# Patient Record
Sex: Female | Born: 1970 | Race: White | Hispanic: No | Marital: Married | Smoking: Never smoker
Health system: Southern US, Community
[De-identification: ages and names within clinical notes are randomized; demographics above are authoritative.]

## PROBLEM LIST (undated history)

## (undated) DIAGNOSIS — Z1379 Encounter for other screening for genetic and chromosomal anomalies: Secondary | ICD-10-CM

## (undated) DIAGNOSIS — D509 Iron deficiency anemia, unspecified: Secondary | ICD-10-CM

## (undated) DIAGNOSIS — E039 Hypothyroidism, unspecified: Secondary | ICD-10-CM

## (undated) DIAGNOSIS — D649 Anemia, unspecified: Secondary | ICD-10-CM

## (undated) DIAGNOSIS — D3911 Neoplasm of uncertain behavior of right ovary: Secondary | ICD-10-CM

## (undated) DIAGNOSIS — C187 Malignant neoplasm of sigmoid colon: Secondary | ICD-10-CM

## (undated) DIAGNOSIS — R519 Headache, unspecified: Secondary | ICD-10-CM

## (undated) DIAGNOSIS — I10 Essential (primary) hypertension: Secondary | ICD-10-CM

## (undated) DIAGNOSIS — R011 Cardiac murmur, unspecified: Secondary | ICD-10-CM

## (undated) DIAGNOSIS — D126 Benign neoplasm of colon, unspecified: Secondary | ICD-10-CM

## (undated) DIAGNOSIS — R51 Headache: Secondary | ICD-10-CM

## (undated) DIAGNOSIS — K5790 Diverticulosis of intestine, part unspecified, without perforation or abscess without bleeding: Secondary | ICD-10-CM

## (undated) DIAGNOSIS — E785 Hyperlipidemia, unspecified: Secondary | ICD-10-CM

## (undated) DIAGNOSIS — E079 Disorder of thyroid, unspecified: Secondary | ICD-10-CM

## (undated) DIAGNOSIS — M67441 Ganglion, right hand: Secondary | ICD-10-CM

## (undated) HISTORY — DX: Encounter for other screening for genetic and chromosomal anomalies: Z13.79

## (undated) HISTORY — PX: APPENDECTOMY: SHX54

## (undated) HISTORY — DX: Hyperlipidemia, unspecified: E78.5

## (undated) HISTORY — PX: MOUTH SURGERY: SHX715

## (undated) HISTORY — DX: Disorder of thyroid, unspecified: E07.9

## (undated) SURGERY — COLECTOMY, SIGMOID, LAPAROSCOPIC
Anesthesia: General

---

## 2002-04-12 ENCOUNTER — Other Ambulatory Visit: Admission: RE | Admit: 2002-04-12 | Discharge: 2002-04-12 | Payer: Self-pay | Admitting: Obstetrics and Gynecology

## 2002-10-30 ENCOUNTER — Inpatient Hospital Stay (HOSPITAL_COMMUNITY): Admission: AD | Admit: 2002-10-30 | Discharge: 2002-11-01 | Payer: Self-pay | Admitting: Obstetrics and Gynecology

## 2003-04-22 ENCOUNTER — Other Ambulatory Visit: Admission: RE | Admit: 2003-04-22 | Discharge: 2003-04-22 | Payer: Self-pay | Admitting: Obstetrics and Gynecology

## 2014-01-28 ENCOUNTER — Ambulatory Visit: Payer: Self-pay | Admitting: Family Medicine

## 2014-05-06 ENCOUNTER — Ambulatory Visit: Payer: Self-pay | Admitting: Family Medicine

## 2014-05-06 LAB — TSH: THYROID STIMULATING HORM: 4.59 u[IU]/mL — AB

## 2014-05-16 ENCOUNTER — Ambulatory Visit: Payer: Self-pay | Admitting: Family Medicine

## 2014-05-20 HISTORY — PX: TOTAL ABDOMINAL HYSTERECTOMY: SHX209

## 2014-06-04 ENCOUNTER — Ambulatory Visit: Payer: Self-pay | Admitting: Gynecologic Oncology

## 2014-06-05 ENCOUNTER — Ambulatory Visit: Payer: Self-pay | Admitting: Gynecologic Oncology

## 2014-06-05 LAB — BASIC METABOLIC PANEL
Anion Gap: 5 — ABNORMAL LOW (ref 7–16)
BUN: 6 mg/dL — ABNORMAL LOW (ref 7–18)
CHLORIDE: 104 mmol/L (ref 98–107)
CREATININE: 0.65 mg/dL (ref 0.60–1.30)
Calcium, Total: 8.4 mg/dL — ABNORMAL LOW (ref 8.5–10.1)
Co2: 28 mmol/L (ref 21–32)
EGFR (African American): >60
EGFR (Non-African Amer.): >60
GLUCOSE: 97 mg/dL (ref 65–99)
Osmolality: 271 (ref 275–301)
POTASSIUM: 4.1 mmol/L (ref 3.5–5.1)
SODIUM: 137 mmol/L (ref 136–145)

## 2014-06-05 LAB — CBC
HCT: 40.2 % (ref 35.0–47.0)
HGB: 13.4 g/dL (ref 12.0–16.0)
MCH: 29.6 pg (ref 26.0–34.0)
MCHC: 33.4 g/dL (ref 32.0–36.0)
MCV: 88 fL (ref 80–100)
PLATELETS: 217 10*3/uL (ref 150–440)
RBC: 4.55 10*6/uL (ref 3.80–5.20)
RDW: 14.6 % — AB (ref 11.5–14.5)
WBC: 5.8 10*3/uL (ref 3.6–11.0)

## 2014-06-11 ENCOUNTER — Ambulatory Visit: Payer: Self-pay | Admitting: Gynecologic Oncology

## 2014-06-12 LAB — HEMOGLOBIN: HGB: 12.3 g/dL (ref 12.0–16.0)

## 2014-06-19 ENCOUNTER — Ambulatory Visit: Payer: Self-pay | Admitting: Gynecologic Oncology

## 2014-07-05 LAB — PATHOLOGY REPORT

## 2014-11-06 ENCOUNTER — Ambulatory Visit: Payer: Self-pay | Admitting: Obstetrics and Gynecology

## 2015-04-12 NOTE — Op Note (Signed)
PATIENT NAME:  Regina Patel, Regina Patel MR#:  [id number] DATE OF BIRTH:  July 05, 1971  DATE OF PROCEDURE:  06/11/2014  PREOPERATIVE DIAGNOSIS: Pelvic mass.   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   PROCEDURES PERFORMED: 1.  Exploratory laparotomy. 2.  Right salpingo-oophorectomy. 3.  Total abdominal hysterectomy. 4.  Left salpingectomy. 5.  Appendectomy.   SURGEON: Jacquelyne Balint, M.D.   ANESTHESIA: General.   COMPLICATIONS: None.   ESTIMATED BLOOD LOSS: 100 mL.   INDICATION FOR SURGERY: Regina Patel is a 44 year old patient who presented with a large pelvic abdominal mass. There was no evidence of metastatic disease but decision was made to proceed with surgery.   FINDINGS AT TIME OF SURGERY: A large mass arising from the right ovary filling the pelvis and extending into the upper abdomen. Moderate adhesions to this mass from the upper abdomen. No excrescences or papillation. Normal uterus. Small cyst on the left ovary. Otherwise normal inspection.   OPERATIVE REPORT: After adequate general anesthesia had been obtained, the patient was prepped and draped in supine position. A midline incision was placed with a sharp knife and carried down through the fascia. The peritoneum was identified and entered. The incision was extended cephalad and caudad. Exploration was done with the above-mentioned findings. Cytology was taken. Then a pursestring suture was placed into the tumor and a 5 mm trocar inserted. Thus several liters of fluid was removed thus decreasing the size of the mass so it could be exteriorized. Then the pelvic sidewall was entered on the right side. Vessels and ureter were identified. The infundibulopelvic ligament was clamped, cut and ligated twice with 0 Vicryl. The tumor was then mobilized and after a clamp was placed around tube and utero-ovarian ligament the tumor could be completely removed.   Then a Bookwalter retractor was placed. The bowel  was packed away with lap sponges. Attention was directed towards the hysterectomy. The round ligament on the right side was stitch ligated and transected. The anterior fold of the peritoneum was incised and the bladder was freed from lower uterine segment, cervix and vagina. Then the round ligament on the left side was stitch ligated and transected. The pelvic sidewall was entered. Vessels and ureter were identified. The tube was freed from the left ovary and a clamp was placed around the utero-ovarian ligament which was then cut and simply ligated as well as stitch ligated using 0 Vicryl. Then the uterine vessels were clamped, cut and stitch ligated. The uterus was then freed from the connections to cardinal and uterosacral ligaments by serially placing clamps and cutting pedicles which were stitch ligated using 0 Vicryl. The last 2 clamps contained the lateral angles of the vagina. Thus uterus and left fallopian tube could be removed completely. The vagina was closed with figure-of-eight stitches using 0 Vicryl. Irrigation of the pelvis was done and adequate hemostasis was confirmed. A small cyst on the left ovary was removed and sent to pathology for definite evaluation.   Because the tumor was reported to be mucinous, decision was made to proceed with appendectomy. The mesentery of the ileum was transected. The base of the appendix was ligated twice with 0 Vicryl. Then the appendix was removed and the stump was buried in a pursestring suture using 2-0 Vicryl. Hemostasis was noted to be adequate in that area. An omental biopsy was taken by dissecting pedicles which were clamped, cut and ligated.   Irrigation was again performed and hemostasis noted in all areas. Lap, sponges  and retractors were removed. The fascia was closed with a running #1 loop PDS suture. Irrigation of the subcutaneous tissue was performed and adequate hemostasis confirmed before it was reapproximated with 2-0 Vicryl. Then 3-0 Monocryl  was used to close the skin in a subcuticular fashion. Dermabond was applied.   The patient tolerated the procedure well and was taken to the recovery room in satisfactory condition. Postoperative urine was clear. Pad, sponge, needle and instrument counts were correct x2.     ____________________________ Weber Cooks, MD bem:sb D: 07/02/2014 13:59:38 ET T: 07/02/2014 16:43:11 ET JOB#: [id number]  cc: Weber Cooks, MD, <Dictator> Weber Cooks MD ELECTRONICALLY SIGNED 07/02/2014 18:07

## 2015-04-17 ENCOUNTER — Other Ambulatory Visit: Payer: Self-pay

## 2015-04-17 DIAGNOSIS — Z1231 Encounter for screening mammogram for malignant neoplasm of breast: Secondary | ICD-10-CM

## 2015-05-03 DIAGNOSIS — E559 Vitamin D deficiency, unspecified: Secondary | ICD-10-CM | POA: Insufficient documentation

## 2015-05-03 DIAGNOSIS — R140 Abdominal distension (gaseous): Secondary | ICD-10-CM | POA: Insufficient documentation

## 2015-05-03 DIAGNOSIS — E782 Mixed hyperlipidemia: Secondary | ICD-10-CM | POA: Insufficient documentation

## 2015-05-03 DIAGNOSIS — D229 Melanocytic nevi, unspecified: Secondary | ICD-10-CM | POA: Insufficient documentation

## 2015-05-07 ENCOUNTER — Other Ambulatory Visit: Payer: Self-pay | Admitting: Family Medicine

## 2015-05-07 ENCOUNTER — Ambulatory Visit
Admission: RE | Admit: 2015-05-07 | Discharge: 2015-05-07 | Disposition: A | Discharge status: Home / Self Care | Payer: Managed Care, Other (non HMO) | Source: Ambulatory Visit | Attending: Family Medicine | Admitting: Family Medicine

## 2015-05-07 DIAGNOSIS — Z1231 Encounter for screening mammogram for malignant neoplasm of breast: Secondary | ICD-10-CM | POA: Diagnosis not present

## 2015-06-03 ENCOUNTER — Other Ambulatory Visit: Payer: Self-pay

## 2015-06-03 DIAGNOSIS — N83202 Unspecified ovarian cyst, left side: Secondary | ICD-10-CM

## 2015-06-04 ENCOUNTER — Ambulatory Visit: Payer: Commercial Indemnity

## 2015-06-04 DIAGNOSIS — N83202 Unspecified ovarian cyst, left side: Secondary | ICD-10-CM

## 2015-06-04 DIAGNOSIS — N8320 Unspecified ovarian cysts: Secondary | ICD-10-CM | POA: Diagnosis not present

## 2015-07-16 ENCOUNTER — Ambulatory Visit: Payer: Self-pay | Admitting: Family Medicine

## 2015-10-02 ENCOUNTER — Other Ambulatory Visit: Payer: Commercial Indemnity

## 2015-10-02 ENCOUNTER — Ambulatory Visit (INDEPENDENT_AMBULATORY_CARE_PROVIDER_SITE_OTHER): Payer: Commercial Indemnity | Admitting: Obstetrics and Gynecology

## 2015-10-02 ENCOUNTER — Encounter: Payer: Self-pay | Admitting: Obstetrics and Gynecology

## 2015-10-02 VITALS — BP 131/82 | HR 67 | Ht 66.0 in | Wt 200.3 lb

## 2015-10-02 DIAGNOSIS — E039 Hypothyroidism, unspecified: Secondary | ICD-10-CM | POA: Insufficient documentation

## 2015-10-02 DIAGNOSIS — D3911 Neoplasm of uncertain behavior of right ovary: Secondary | ICD-10-CM | POA: Diagnosis not present

## 2015-10-02 DIAGNOSIS — Z90710 Acquired absence of both cervix and uterus: Secondary | ICD-10-CM | POA: Insufficient documentation

## 2015-10-02 DIAGNOSIS — Z90721 Acquired absence of ovaries, unilateral: Secondary | ICD-10-CM

## 2015-10-02 DIAGNOSIS — Z9079 Acquired absence of other genital organ(s): Secondary | ICD-10-CM | POA: Diagnosis not present

## 2015-10-02 NOTE — Progress Notes (Signed)
Chief complaint: 1.  History of borderline tumor of uncertain malignant potential, right ovary. 2.  Status post TAH RSO.  Patient is here for 6 month follow-up.  She is a 44 year old white female, para 28, 56, who is status post TAH RSO and left salpingectomy on 05/2014,  Who presents for follow-up. Bowel and bladder function are normal.  She is not experiencing any significant pelvic pain. Pelvic ultrasound in April 2016 demonstrated a 3 cm complex cyst in the left ovary.  Subsequent follow-up ultrasound in June 2016 demonstrated a normal-appearing left ovary with a simple dominant follicle 1.5 cm.  Past Medical History  Diagnosis Date  . Hyperlipidemia   . Thyroid disease    Past Surgical History  Procedure Laterality Date  . Total abdominal hysterectomy  05/20/2014    ooperectomy unilateral   Review of Systems  Constitutional: Negative.   Respiratory: Negative.   Cardiovascular: Negative.   Gastrointestinal: Negative.   Genitourinary: Negative.   Musculoskeletal: Negative.   Skin: Negative.   Neurological: Negative.   Endo/Heme/Allergies: Negative.   Psychiatric/Behavioral: Negative.    OBJECTIVE: BP 131/82 mmHg  Pulse 67  Ht 5\' 6"  (1.676 m)  Wt 200 lb 5 oz (90.861 kg)  BMI 32.35 kg/m2 Flovent, white female in no acute distress. Back: No CVA tenderness. Abdomen: Well-healed midline incision; soft, nontender; no organomegaly. Pelvic exam: External genitalia normal BUS-normal Vagina-normal. Cervix-surgically absent Uterus-surgically absent. Adnexa-nonpalpable, nontender. Rectovaginal exam-normal sphincter tone,; no rectal masses. Extremities: Without clubbing, cyanosis or edema.  IMPRESSION: 1.  Normal 6 month follow-up. 2.  History of borderline tumor, right ovary of uncertain malignant potential. 3.  Status post TAH, RSO and left salpingectomy.  PLAN: 1.  Pelvic ultrasound. 2.  CA-125. 3.  Return in 6 months for follow-up  Brayton Mars, MD

## 2015-10-02 NOTE — Patient Instructions (Signed)
1.  Normal exam today. 2.  CA-125 lab test today. 3.  Pelvic ultrasound is scheduled. 4.  Return in 6 months for follow-up

## 2015-10-03 ENCOUNTER — Ambulatory Visit: Payer: Commercial Indemnity

## 2015-10-03 DIAGNOSIS — D3911 Neoplasm of uncertain behavior of right ovary: Secondary | ICD-10-CM | POA: Diagnosis not present

## 2015-10-03 LAB — CA 125: CA 125: 6.9 U/mL (ref 0.0–38.1)

## 2015-10-10 ENCOUNTER — Telehealth: Payer: Self-pay

## 2015-10-10 NOTE — Telephone Encounter (Signed)
Pt aware ca 125 wnl.

## 2015-10-16 ENCOUNTER — Telehealth: Payer: Self-pay

## 2015-10-16 NOTE — Telephone Encounter (Signed)
-----   Message from Brayton Mars, MD sent at 10/12/2015  8:48 PM EDT ----- Please Notify - Labs normal Simple cyst 1.3 cm in left ovary (benign)

## 2015-10-16 NOTE — Telephone Encounter (Signed)
I have left 3 messages for pt to contact office. Chart filed.

## 2016-04-01 ENCOUNTER — Ambulatory Visit: Payer: Commercial Indemnity | Admitting: Obstetrics and Gynecology

## 2016-04-08 ENCOUNTER — Encounter: Payer: Self-pay | Admitting: Obstetrics and Gynecology

## 2016-04-08 ENCOUNTER — Ambulatory Visit (INDEPENDENT_AMBULATORY_CARE_PROVIDER_SITE_OTHER): Payer: Commercial Indemnity | Admitting: Obstetrics and Gynecology

## 2016-04-08 VITALS — BP 145/86 | HR 76 | Wt 207.3 lb

## 2016-04-08 DIAGNOSIS — Z90710 Acquired absence of both cervix and uterus: Secondary | ICD-10-CM

## 2016-04-08 DIAGNOSIS — Z9079 Acquired absence of other genital organ(s): Secondary | ICD-10-CM | POA: Diagnosis not present

## 2016-04-08 DIAGNOSIS — Z90721 Acquired absence of ovaries, unilateral: Secondary | ICD-10-CM

## 2016-04-08 DIAGNOSIS — D3911 Neoplasm of uncertain behavior of right ovary: Secondary | ICD-10-CM

## 2016-04-08 DIAGNOSIS — R350 Frequency of micturition: Secondary | ICD-10-CM

## 2016-04-08 LAB — POCT URINALYSIS DIPSTICK
Bilirubin, UA: 1
Glucose, UA: NEGATIVE
KETONES UA: NEGATIVE
Nitrite, UA: NEGATIVE
PROTEIN UA: NEGATIVE
Urobilinogen, UA: 0.2
pH, UA: 6

## 2016-04-08 NOTE — Progress Notes (Signed)
Chief complaint: 1. History of borderline tumor of uncertain malignant potential, right ovary 2. Status post TAH RSO   Patient is here for 6 month follow-up. She is a 45 year old white female, para 12, 79, who is status post TAH RSO and left salpingectomy on 05/2014, Who presents for follow-up. Bowel function is normal. Bladder function is notable for some urinary frequency and post void discomfort intermittently. She is not experiencing any significant pelvic pain. She has gained 7 pounds in past 6 months. Pelvic ultrasound in April 2016 demonstrated a 3 cm complex cyst in the left ovary. Pelvic ultrasound in June 2016 demonstrated a normal-appearing left ovary with a simple dominant follicle 1.5 cm. Pelvic ultrasound in October 2016 demonstrated a normal-appearing left ovary with a simple dominant follicle measuring 1.3 cm  CA-125 on 10/02/2015 was 6.9  Past medical history, past surgical history, problem list, medications, and allergies are reviewed  OBJECTIVE: BP 145/86 mmHg  Pulse 76  Wt 207 lb 5 oz (94.036 kg)  Pleasant well-appearing white female in no acute distress Back: Without CVA tenderness Abdomen: Soft, nontender, without organomegaly; well-healed midline incision without evidence of hernia Pelvic exam: External genitalia normal BUS normal Vagina-normal; good vault support Cervix-surgically absent Uterus-surgically absent Adnexa-right side nonpalpable and nontender; left sided palpable and nontender Rectovaginal exam-normal external exam; normal sphincter tone; no rectal masses Extremities: Without clubbing cyanosis or edema Skin: Without rash  ASSESSMENT: 1. History of borderline tumor, right ovary of uncertain malignant potential 2. Status post TAH RSO and left salpingectomy June 2015 3. No evidence of disease  PLAN: 1. CA-125 2. Pelvic ultrasound 3. Return in 6 months for follow-up. If the CA-125 and pelvic ultrasound were normal, we will proceed with  physical exams only unless other factors dictate imaging and blood work.

## 2016-04-08 NOTE — Patient Instructions (Signed)
1. Ultrasound is scheduled for assessment of the adnexa 2. CA-125 is drawn today 3. Return in 6 months for follow-up 4. Urinalysis and culture is done today to rule out UTI

## 2016-04-09 LAB — CA 125: CA 125: 7.3 U/mL (ref 0.0–38.1)

## 2016-04-10 LAB — URINE CULTURE

## 2016-04-13 ENCOUNTER — Ambulatory Visit (INDEPENDENT_AMBULATORY_CARE_PROVIDER_SITE_OTHER): Payer: Commercial Indemnity

## 2016-04-13 DIAGNOSIS — D3911 Neoplasm of uncertain behavior of right ovary: Secondary | ICD-10-CM | POA: Diagnosis not present

## 2016-04-15 ENCOUNTER — Telehealth: Payer: Self-pay

## 2016-04-15 MED ORDER — NITROFURANTOIN MONOHYD MACRO 100 MG PO CAPS: 100.0000 mg | ORAL_CAPSULE | Freq: Two times a day (BID) | ORAL | Status: DC

## 2016-04-15 NOTE — Telephone Encounter (Signed)
-----   Message from Brayton Mars, MD sent at 04/14/2016  9:37 PM EDT ----- Please notify - Abnormal Labs Call in [place] twice a day for 7 days

## 2016-04-15 NOTE — Telephone Encounter (Signed)
-----   Message from Brayton Mars, MD sent at 04/14/2016  9:37 PM EDT ----- Please notify - Abnormal Labs Call in [place] twice a day for 7 days

## 2016-04-19 MED ORDER — NITROFURANTOIN MONOHYD MACRO 100 MG PO CAPS: 100.0000 mg | ORAL_CAPSULE | Freq: Two times a day (BID) | ORAL | Status: DC

## 2016-04-19 NOTE — Telephone Encounter (Signed)
Pt aware. Med erx. 

## 2016-04-19 NOTE — Telephone Encounter (Signed)
-----   Message from Brayton Mars, MD sent at 04/14/2016  9:37 PM EDT ----- Please notify - Abnormal Labs Call in [place] twice a day for 7 days

## 2016-04-22 ENCOUNTER — Encounter: Payer: Self-pay | Admitting: Family Medicine

## 2016-04-22 ENCOUNTER — Ambulatory Visit (INDEPENDENT_AMBULATORY_CARE_PROVIDER_SITE_OTHER): Payer: Commercial Indemnity | Admitting: Family Medicine

## 2016-04-22 VITALS — BP 135/86 | HR 81 | Temp 98.6°F | Resp 16 | Ht 67.0 in | Wt 206.0 lb

## 2016-04-22 DIAGNOSIS — E669 Obesity, unspecified: Secondary | ICD-10-CM | POA: Diagnosis not present

## 2016-04-22 DIAGNOSIS — E038 Other specified hypothyroidism: Secondary | ICD-10-CM | POA: Diagnosis not present

## 2016-04-22 DIAGNOSIS — M7661 Achilles tendinitis, right leg: Secondary | ICD-10-CM | POA: Diagnosis not present

## 2016-04-22 DIAGNOSIS — E034 Atrophy of thyroid (acquired): Secondary | ICD-10-CM | POA: Diagnosis not present

## 2016-04-22 DIAGNOSIS — E559 Vitamin D deficiency, unspecified: Secondary | ICD-10-CM

## 2016-04-22 MED ORDER — NAPROXEN 500 MG PO TABS: 500.0000 mg | ORAL_TABLET | Freq: Two times a day (BID) | ORAL | Status: DC

## 2016-04-22 MED ORDER — LEVOTHYROXINE SODIUM 175 MCG PO TABS: 175.0000 ug | ORAL_TABLET | Freq: Every day | ORAL | Status: DC

## 2016-04-22 NOTE — Assessment & Plan Note (Signed)
Pt taking OTC. Recheck vitamin D to determine if prescription strength is needed.

## 2016-04-22 NOTE — Patient Instructions (Signed)
Achilles Tendinitis Achilles tendinitis is inflammation of the tough, cord-like band that attaches the lower muscles of your leg to your heel (Achilles tendon). It is usually caused by overusing the tendon and joint involved.  CAUSES Achilles tendinitis can happen because of:  A sudden increase in exercise or activity (such as running).  Doing the same exercises or activities (such as jumping) over and over.  Not warming up calf muscles before exercising.  Exercising in shoes that are worn out or not made for exercise.  Having arthritis or a bone growth on the back of the heel bone. This can rub against the tendon and hurt the tendon. SIGNS AND SYMPTOMS The most common symptoms are:  Pain in the back of the leg, just above the heel. The pain usually gets worse with exercise and better with rest.  Stiffness or soreness in the back of the leg, especially in the morning.  Swelling of the skin over the Achilles tendon.  Trouble standing on tiptoe. Sometimes, an Achilles tendon tears (ruptures). Symptoms of an Achilles tendon rupture can include:  Sudden, severe pain in the back of the leg.  Trouble putting weight on the foot or walking normally. DIAGNOSIS Achilles tendinitis will be diagnosed based on symptoms and a physical examination. An X-ray may be done to check if another condition is causing your symptoms. An MRI may be ordered if your health care provider suspects you may have completely torn your tendon, which is called an Achilles tendon rupture.  TREATMENT  Achilles tendinitis usually gets better over time. It can take weeks to months to heal completely. Treatment focuses on treating the symptoms and helping the injury heal. HOME CARE INSTRUCTIONS   Rest your Achilles tendon and avoid activities that cause pain.  Apply ice to the injured area:  Put ice in a plastic bag.  Place a towel between your skin and the bag.  Leave the ice on for 20 minutes, 2-3 times a  day  Try to avoid using the tendon (other than gentle range of motion) while the tendon is painful. Do not resume use until instructed by your health care provider. Then begin use gradually. Do not increase use to the point of pain. If pain does develop, decrease use and continue the above measures. Gradually increase activities that do not cause discomfort until you achieve normal use.  Do exercises to make your calf muscles stronger and more flexible. Your health care provider or physical therapist can recommend exercises for you to do.  Wrap your ankle with an elastic bandage or other wrap. This can help keep your tendon from moving too much. Your health care provider will show you how to wrap your ankle correctly.  Only take over-the-counter or prescription medicines for pain, discomfort, or fever as directed by your health care provider. SEEK MEDICAL CARE IF:   Your pain and swelling increase or pain is uncontrolled with medicines.  You develop new, unexplained symptoms or your symptoms get worse.  You are unable to move your toes or foot.  You develop warmth and swelling in your foot.  You have an unexplained temperature. MAKE SURE YOU:   Understand these instructions.  Will watch your condition.  Will get help right away if you are not doing well or get worse.   This information is not intended to replace advice given to you by your health care provider. Make sure you discuss any questions you have with your health care provider.   Document Released:   09/15/2005 Document Revised: 12/27/2014 Document Reviewed: 07/18/2013 Elsevier Interactive Patient Education 2016 [employer]  

## 2016-04-22 NOTE — Assessment & Plan Note (Signed)
Pt commended on her plans for exercise. Encouraged to keep up light activity through heal injury.

## 2016-04-22 NOTE — Progress Notes (Signed)
Subjective:    Patient ID: Regina Patel, female    DOB: 06-26-1971, 45 y.o.   MRN: [id number]  HPI: Faten Dilling is a 45 y.o. female presenting on 04/22/2016 for Thyroid Problem   HPI  Pt presents for follow-up of hypothyroidism. Pt feels more tired. Energy level as plummeted. Felt better on her 200/158mcg combo. No change in hair or BM's.  Is still taking vitamin D OTC but has not had that checking a little while.  Heel pain- after jogging. R heel. Pain up the calf when going down stairs. Tendons feel tight. Pain occurs after running. Pain is 3/10. Gets better with stretching. Prior to pain pt recently increased her milage with running. Wears Reebox for jogging.    Past Medical History  Diagnosis Date  . Hyperlipidemia   . Thyroid disease     Current Outpatient Prescriptions on File Prior to Visit  Medication Sig  . Biotin 10 MG TABS Take by mouth.  . IRON, IRON, Take by mouth.  . Multiple Vitamin tablet Take by mouth.  . nitrofurantoin, macrocrystal-monohydrate, (MACROBID) 100 MG capsule Take 1 capsule (100 mg total) by mouth 2 (two) times daily.  . OMEGA-3 FATTY ACIDS PO Take by mouth.  . Vitamin D, Ergocalciferol, (DRISDOL) 50000 UNITS CAPS capsule Take 50,000 Units by mouth every 7 (seven) days.   No current facility-administered medications on file prior to visit.    Review of Systems Per HPI unless specifically indicated above     Objective:    BP 135/86 mmHg  Pulse 81  Temp(Src) 98.6 F (37 C) (Oral)  Resp 16  Ht 5\' 7"  (1.702 m)  Wt 206 lb (93.441 kg)  BMI 32.26 kg/m2  Wt Readings from Last 3 Encounters:  04/22/16 206 lb (93.441 kg)  04/08/16 207 lb 5 oz (94.036 kg)  10/02/15 200 lb 5 oz (90.861 kg)    Physical Exam  Constitutional: She is oriented to person, place, and time. She appears well-developed and well-nourished.  HENT:  Head: Normocephalic and atraumatic.  Neck: Neck supple.  Cardiovascular: Normal rate, regular rhythm and normal heart sounds.   Exam reveals no gallop and no friction rub.   No murmur heard. Pulmonary/Chest: Effort normal and breath sounds normal. She has no wheezes. She exhibits no tenderness.  Abdominal: Soft. Normal appearance and bowel sounds are normal. She exhibits no distension and no mass. There is no tenderness. There is no rebound and no guarding.  Musculoskeletal: Normal range of motion. She exhibits no edema or tenderness.       Right ankle: She exhibits normal range of motion, no swelling, no ecchymosis, no deformity and normal pulse. Achilles tendon exhibits pain (with stepping down). Achilles tendon exhibits no defect and normal Thompson's test results.       Left ankle: She exhibits normal range of motion, no swelling, no ecchymosis and normal pulse. Achilles tendon exhibits no defect and normal Thompson's test results. Pain: with stepping down.  Lymphadenopathy:    She has no cervical adenopathy.  Neurological: She is alert and oriented to person, place, and time.  Skin: Skin is warm and dry.   Results for orders placed or performed in visit on 04/08/16  Urine culture  Result Value Ref Range   Urine Culture, Routine Final report (A)    Urine Culture result 1 Escherichia coli (A)    ANTIMICROBIAL SUSCEPTIBILITY Comment   CA 125  Result Value Ref Range   CA 125 7.3 0.0 - 38.1 U/mL  POCT  urinalysis dipstick  Result Value Ref Range   Color, UA yellow    Clarity, UA clear    Glucose, UA neg    Bilirubin, UA 1    Ketones, UA neg    Spec Grav, UA >=1.030    Blood, UA hem trace    pH, UA 6.0    Protein, UA neg    Urobilinogen, UA 0.2    Nitrite, UA neg    Leukocytes, UA moderate (2+) (A) Negative      Assessment & Plan:   Problem List Items Addressed This Visit      Endocrine   Adult hypothyroidism - Primary    Pt feeling fatigued and slow- suspect dosing may need to be titrated. Check TSH. Consider going back to 200/175 combination pending TSH.  Recheck 3-6 mos as needed.        Relevant Medications   levothyroxine (SYNTHROID, LEVOTHROID) 175 MCG tablet   Other Relevant Orders   TSH     Other   Avitaminosis D    Pt taking OTC. Recheck vitamin D to determine if prescription strength is needed.       Relevant Orders   VITAMIN D 25 Hydroxy (Vit-D Deficiency, Fractures)   Obesity    Pt commended on her plans for exercise. Encouraged to keep up light activity through heal injury.        Other Visit Diagnoses    Achilles tendinitis of right lower extremity        Suspect achilles overuse. NSAIDs x2 weeks. Gentle stretching and icing heel. Consider PT if not improving. Recheck 2 weeks if needed.     Relevant Medications    naproxen (NAPROSYN) 500 MG tablet    Other Relevant Orders    Basic Metabolic Panel (BMET)       Meds ordered this encounter  Medications  . naproxen (NAPROSYN) 500 MG tablet    Sig: Take 1 tablet (500 mg total) by mouth 2 (two) times daily with a meal.    Dispense:  30 tablet    Refill:  2    Order Specific Question:  Supervising Provider    Answer:  Arlis Porta [phone]  . levothyroxine (SYNTHROID, LEVOTHROID) 175 MCG tablet    Sig: Take 1 tablet (175 mcg total) by mouth daily before breakfast.    Dispense:  30 tablet    Refill:  11    Order Specific Question:  Supervising Provider    Answer:  Arlis Porta [phone]      Follow up plan: Return in about 6 months (around 10/23/2016) for hypothy.

## 2016-04-22 NOTE — Assessment & Plan Note (Signed)
Pt feeling fatigued and slow- suspect dosing may need to be titrated. Check TSH. Consider going back to 200/175 combination pending TSH.  Recheck 3-6 mos as needed.

## 2016-04-24 LAB — BASIC METABOLIC PANEL
BUN / CREAT RATIO: 14 (ref 9–23)
BUN: 9 mg/dL (ref 6–24)
CHLORIDE: 101 mmol/L (ref 96–106)
CO2: 23 mmol/L (ref 18–29)
Calcium: 9.5 mg/dL (ref 8.7–10.2)
Creatinine, Ser: 0.63 mg/dL (ref 0.57–1.00)
GFR calc non Af Amer: 108 mL/min/{1.73_m2} (ref >59)
GFR, EST AFRICAN AMERICAN: 124 mL/min/{1.73_m2} (ref >59)
Glucose: 97 mg/dL (ref 65–99)
Potassium: 4.4 mmol/L (ref 3.5–5.2)
SODIUM: 141 mmol/L (ref 134–144)

## 2016-04-24 LAB — TSH: TSH: 0.827 u[IU]/mL (ref 0.450–4.500)

## 2016-04-24 LAB — VITAMIN D 25 HYDROXY (VIT D DEFICIENCY, FRACTURES): Vit D, 25-Hydroxy: 24.1 ng/mL — ABNORMAL LOW (ref 30.0–100.0)

## 2016-04-26 ENCOUNTER — Other Ambulatory Visit: Payer: Self-pay | Admitting: Family Medicine

## 2016-04-26 MED ORDER — VITAMIN D (ERGOCALCIFEROL) 1.25 MG (50000 UNIT) PO CAPS: 50000.0000 [IU] | ORAL_CAPSULE | ORAL | Status: DC

## 2016-04-27 ENCOUNTER — Telehealth: Payer: Self-pay | Admitting: Family Medicine

## 2016-04-27 NOTE — Telephone Encounter (Signed)
Pt called states she was returning a call. Pt call back # is  [phone]

## 2016-10-06 ENCOUNTER — Encounter: Payer: Self-pay | Admitting: Obstetrics and Gynecology

## 2016-10-06 ENCOUNTER — Ambulatory Visit (INDEPENDENT_AMBULATORY_CARE_PROVIDER_SITE_OTHER): Payer: Managed Care, Other (non HMO) | Admitting: Obstetrics and Gynecology

## 2016-10-06 VITALS — BP 125/78 | HR 75 | Wt 202.6 lb

## 2016-10-06 DIAGNOSIS — N949 Unspecified condition associated with female genital organs and menstrual cycle: Secondary | ICD-10-CM

## 2016-10-06 DIAGNOSIS — Z90721 Acquired absence of ovaries, unilateral: Secondary | ICD-10-CM | POA: Diagnosis not present

## 2016-10-06 DIAGNOSIS — Z90710 Acquired absence of both cervix and uterus: Secondary | ICD-10-CM

## 2016-10-06 DIAGNOSIS — Z9079 Acquired absence of other genital organ(s): Secondary | ICD-10-CM

## 2016-10-06 DIAGNOSIS — D3911 Neoplasm of uncertain behavior of right ovary: Secondary | ICD-10-CM

## 2016-10-06 NOTE — Patient Instructions (Signed)
1. Pelvic ultrasound is ordered to assess left adnexa-fullness 2. Return in 6 months for follow-up 3. Results of ultrasound will be made available

## 2016-10-06 NOTE — Progress Notes (Signed)
GYN ENCOUNTER NOTE  Subjective:       Regina Patel is a 45 y.o. No obstetric history on file. female is here for gynecologic evaluation of the following issues:  1. 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  - FOCAL CYST RUPTURE WITH ISCHEMIC NECROSIS.  - FALLOPIAN TUBE WITH LYMPHANGIECTASIA AND BENIGN PARATUBAL  CYSTS.  .  Part B: LEFT OVARY CYST:  - SIMPLE CYST, 2 CM.  - FOLLICULAR CYST, 3.2 CM.  - NEGATIVE FOR MALIGNANCY.  .  Part C: UTERUS WITH CERVIX AND LEFT FALLOPIAN TUBE:  - SMALL ENDOCERVICAL POLYPS.  - ADENOMYOSIS.  - SECRETORY ENDOMETRIUM.  - FALLOPIAN TUBE WITH BENIGN PARATUBAL CYSTS.  - NEGATIVE FOR MALIGNANCY.  .  Part D: APPENDIX, INCIDENTAL APPENDECTOMY:  - APPENDIX WITH FIBROUS OBLITERATION.  - NEGATIVE FOR MALIGNANCY.   Patient's interval history has been negative for disease to date. Bowel and bladder function are normal. No abdominal pelvic pain is appreciated. No vasomotor symptoms. April 2017 ultrasound and CA-125 were normal.   Gynecologic History No LMP recorded. Patient has had a hysterectomy. Status post TAH RSO with left salpingectomy Contraception: status post hysterectomy  Obstetric History OB History  No data available    Past Medical History:  Diagnosis Date  . Hyperlipidemia   . Thyroid disease     Past Surgical History:  Procedure Laterality Date  . TOTAL ABDOMINAL HYSTERECTOMY  05/20/2014   ooperectomy unilateral    Current Outpatient Prescriptions on File Prior to Visit  Medication Sig Dispense Refill  . Biotin 10 MG TABS Take by mouth.    . IRON, IRON, Take by mouth.    . levothyroxine (SYNTHROID, LEVOTHROID) 175 MCG tablet Take 1 tablet (175 mcg total) by mouth daily before breakfast. 30 tablet 11  . Multiple Vitamin tablet Take by  mouth.    . OMEGA-3 FATTY ACIDS PO Take by mouth.    . Vitamin D, Ergocalciferol, (DRISDOL) 50000 units CAPS capsule Take 1 capsule (50,000 Units total) by mouth every 7 (seven) days. 12 capsule 1   No current facility-administered medications on file prior to visit.     No Known Allergies  Social History   Social History  . Marital status: Married    Spouse name: N/A  . Number of children: N/A  . Years of education: N/A   Occupational History  . Not on file.   Social History Main Topics  . Smoking status: Never Smoker  . Smokeless tobacco: Never Used  . Alcohol use No  . Drug use: No  . Sexual activity: Not on file   Other Topics Concern  . Not on file   Social History Narrative  . No narrative on file    Family History  Problem Relation Age of Onset  . Breast cancer Mother 63    The following portions of the patient's history were reviewed and updated as appropriate: allergies, current medications, past family history, past medical history, past social history, past surgical history and problem list.  Review of Systems Review of Systems - per history of present illness Review of Systems - General ROS: negative for - chills, fatigue, fever, hot flashes, malaise or night sweats Hematological and Lymphatic ROS: negative for - bleeding problems or swollen lymph nodes Gastrointestinal ROS: negative for - abdominal pain, blood in  stools, change in bowel habits and nausea/vomiting Musculoskeletal ROS: negative for - joint pain, muscle pain or muscular weakness Genito-Urinary ROS: negative for - change in menstrual cycle, dysmenorrhea, dyspareunia, dysuria, genital discharge, genital ulcers, hematuria, incontinence, irregular/heavy menses, nocturia or pelvic pain  Objective:   BP 125/78   Pulse 75   Wt 202 lb 9.6 oz (91.9 kg)   BMI 31.73 kg/m  CONSTITUTIONAL: Well-developed, well-nourished female in no acute distress.  HENT:  Normocephalic, atraumatic.  NECK:  Normal range of motion, supple, no masses.  Normal thyroid.  SKIN: Skin is warm and dry. No rash noted. Not diaphoretic. No erythema. No pallor. [place]: Alert and oriented to person, place, and time. PSYCHIATRIC: Normal mood and affect. Normal behavior. Normal judgment and thought content. CARDIOVASCULAR:Not Examined RESPIRATORY: Not Examined BREASTS: Not Examined ABDOMEN: Soft, non distended; Non tender.  No Organomegaly.Midline incision well-healed. No palpable abdominal or pelvic masses PELVIC:  External Genitalia: Normal  BUS: Normal  Vagina: Normal; good vaginal vault support  Cervix: Surgically absent  Uterus: Surgically absent  Adnexa: Right adnexa nonpalpable and nontender; left adnexa nontender, however, fullness is appreciated  RV: Normal external exam; normal sphincter tone; no rectal masses  Bladder: Nontender MUSCULOSKELETAL: Normal range of motion. No tenderness.  No cyanosis, clubbing, or edema.     Assessment:   1. 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 Left adnexal fullness on exam 3. April 2017 ultrasound and CA-125 normal   Plan:   1. Pelvic ultrasound; patient will be notified of results and further management planning 2. Return in 6 months for follow-up  A total of 15 minutes were spent face-to-face with the patient during this encounter and over half of that time dealt with counseling and coordination of care.  Brayton Mars, MD  Note: This dictation was prepared with Dragon dictation along with smaller phrase technology. Any transcriptional errors that result from this process are unintentional.

## 2016-10-07 ENCOUNTER — Ambulatory Visit (INDEPENDENT_AMBULATORY_CARE_PROVIDER_SITE_OTHER): Payer: Managed Care, Other (non HMO)

## 2016-10-07 DIAGNOSIS — D3911 Neoplasm of uncertain behavior of right ovary: Secondary | ICD-10-CM | POA: Diagnosis not present

## 2016-10-07 DIAGNOSIS — Z90710 Acquired absence of both cervix and uterus: Secondary | ICD-10-CM

## 2016-10-07 DIAGNOSIS — N949 Unspecified condition associated with female genital organs and menstrual cycle: Secondary | ICD-10-CM

## 2016-10-07 DIAGNOSIS — Z9079 Acquired absence of other genital organ(s): Secondary | ICD-10-CM

## 2016-10-07 DIAGNOSIS — Z90721 Acquired absence of ovaries, unilateral: Secondary | ICD-10-CM | POA: Diagnosis not present

## 2016-10-19 ENCOUNTER — Telehealth: Payer: Self-pay | Admitting: Obstetrics and Gynecology

## 2016-10-19 NOTE — Telephone Encounter (Signed)
PT CALLED AND SHE HAD A PARITAL HYSTERECTOMY ANDS HE HAS A RASH THAT SHE HAD WHEN SHE WAS PREGNANT AND DR DE TOLD HER THAT SHE WOULD GET THIS RASH AGAIN WHEN SHE WENT THRU [facility], AND SHE HAD THE SURGERY AND SHE STATED THAT THE RASH HAS FLARED UP WORSE THEN EVER AND SHE ISNT SURE IF DR DE NEEDS TO SEE HER OR NOT, THE RASH IS CALLED HERPES GESTATIONS.

## 2016-10-19 NOTE — Telephone Encounter (Signed)
vm not set up yet

## 2016-10-21 NOTE — Telephone Encounter (Signed)
vm not set up yet

## 2016-10-22 NOTE — Telephone Encounter (Signed)
LM on [phone] #. Can not leave message on any other number.

## 2016-10-28 ENCOUNTER — Telehealth: Payer: Self-pay | Admitting: Obstetrics and Gynecology

## 2016-10-28 NOTE — Telephone Encounter (Signed)
Patient called stating her rash is going away and she doesn't need to be contacted.Thanks

## 2017-04-06 ENCOUNTER — Encounter: Payer: Commercial Indemnity | Admitting: Obstetrics and Gynecology

## 2017-05-02 ENCOUNTER — Ambulatory Visit (INDEPENDENT_AMBULATORY_CARE_PROVIDER_SITE_OTHER): Payer: Managed Care, Other (non HMO) | Admitting: Nurse Practitioner

## 2017-05-02 ENCOUNTER — Encounter: Payer: Self-pay | Admitting: Nurse Practitioner

## 2017-05-02 VITALS — BP 144/73 | HR 80 | Temp 98.0°F | Ht 67.0 in | Wt 207.8 lb

## 2017-05-02 DIAGNOSIS — E034 Atrophy of thyroid (acquired): Secondary | ICD-10-CM | POA: Diagnosis not present

## 2017-05-02 DIAGNOSIS — E039 Hypothyroidism, unspecified: Secondary | ICD-10-CM | POA: Diagnosis not present

## 2017-05-02 LAB — TSH: TSH: 1.19 mIU/L

## 2017-05-02 NOTE — Progress Notes (Signed)
I have reviewed this encounter including the documentation in this note and/or discussed this patient with the provider, Cassell Smiles, AGPCNP-BC. I am certifying that I agree with the content of this note as supervising physician.  [name], [facility] 05/02/2017, 12:55 PM

## 2017-05-02 NOTE — Progress Notes (Signed)
Subjective:    Patient ID: Regina Patel, female    DOB: 11-02-1971, 46 y.o.   MRN: [id number]  Regina Patel is a 46 y.o. female presenting on 05/02/2017 for Follow-up (refill medication)   HPI Hypothyroidism Started when she was 46 years old.  Ultrasound has been done without any additional action.  No additional testing performed to ID any causes of hypothyroidism.  Has been stable on 175 mcg levothyroxine for 2 years.   Has enough energy.  Not significantly fatigued despite period of significant life events with son getting married and daughter graduating.  Energy is better with exercise.  Nails more brittle over the last 6 months.  Occasional palpitation.  Weight gain of 5 lbs over 6 months.  Some lifestyle worsening could be contributing - not as much exercise.  Feels bloated when she eats and has lots of abdominal cramps, gas and belching.  GI tract upset is over last 6 months.   More constipation than normal soft stools.  No diarrhea    Pt states she is taking his levothyroxine in the am at least 1 hour before eating or drinking and taking other medicines.  She denies heart racing, heat and cold intolerance, changes in hair/skin/nails, and lower leg swelling. She does not have any compressive symptoms to include difficulty swallowing, globus sensation, or difficulty breathing when lying flat.   Social History  Substance Use Topics  . Smoking status: Never Smoker  . Smokeless tobacco: Never Used  . Alcohol use No    Review of Systems Per HPI unless specifically indicated above     Objective:    BP (!) 144/73   Pulse 80   Temp 98 F (36.7 C) (Oral)   Ht 5\' 7"  (1.702 m)   Wt 207 lb 12.8 oz (94.3 kg)   BMI 32.55 kg/m    Wt Readings from Last 3 Encounters:  05/02/17 207 lb 12.8 oz (94.3 kg)  10/06/16 202 lb 9.6 oz (91.9 kg)  04/22/16 206 lb (93.4 kg)    Physical Exam  Constitutional: She is oriented to person, place, and time. She appears well-developed and well-nourished. No  distress.  HENT:  Head: Normocephalic and atraumatic.  Eyes: Conjunctivae are normal. Pupils are equal, round, and reactive to light.  Neck: Normal range of motion. Neck supple. No JVD present. No tracheal deviation present. No thyromegaly present.  Cardiovascular: Normal rate, regular rhythm and intact distal pulses.   Murmur heard. Grade 1/6 systolic murmur  Pulmonary/Chest: Effort normal and breath sounds normal. No respiratory distress.  Abdominal: Soft. Bowel sounds are normal. She exhibits no distension and no mass. There is no tenderness.  Musculoskeletal: Normal range of motion.  Lymphadenopathy:    She has no cervical adenopathy.  Neurological: She is alert and oriented to person, place, and time.  Skin: Skin is warm and dry.  Psychiatric: She has a normal mood and affect. Her behavior is normal. Judgment and thought content normal.   Results for orders placed or performed in visit on 04/22/16  TSH  Result Value Ref Range   TSH 0.827 0.450 - 4.500 uIU/mL  VITAMIN D 25 Hydroxy (Vit-D Deficiency, Fractures)  Result Value Ref Range   Vit D, 25-Hydroxy 24.1 (L) 30.0 - 100.0 ng/mL  Basic Metabolic Panel (BMET)  Result Value Ref Range   Glucose 97 65 - 99 mg/dL   BUN 9 6 - 24 mg/dL   Creatinine, Ser 0.63 0.57 - 1.00 mg/dL   GFR calc non Af Wyvonnia Lora  108 >59 mL/min/1.73   GFR calc Af Amer 124 >59 mL/min/1.73   BUN/Creatinine Ratio 14 9 - 23   Sodium 141 134 - 144 mmol/L   Potassium 4.4 3.5 - 5.2 mmol/L   Chloride 101 96 - 106 mmol/L   CO2 23 18 - 29 mmol/L   Calcium 9.5 8.7 - 10.2 mg/dL      Assessment & Plan:   Problem List Items Addressed This Visit      Endocrine   Hypothyroidism - Primary Stable symptoms.  Awaiting lab results.  Plan: 1. Check TSH and lipid panel to evaluate thyroid function and potential lipid disorder that is commonly altered with hypothyroidism. 2. Repeat TSH in 6 months, Office visit for thyroid followup in 1 year. 3. Continue levothyroxine 175  mcg daily until labs result.   Relevant Orders   TSH   Lipid panel        Follow up plan: Return in about 1 year (around 05/02/2018) for Thyroid check and 6 months for labs only, annual physical in 6 months.   Cassell Smiles, DNP, AGPCNP-BC Adult Gerontology Primary Care Nurse Practitioner [facility] 05/02/2017, 8:49 AM

## 2017-05-02 NOTE — Patient Instructions (Signed)
Regina Patel, Thank you for coming in to clinic today.  1. For your thyroid, - I will send a refill once labs are back. - Checking TSH, and lipid panel today.  Please schedule a follow-up appointment with Cassell Smiles, AGNP to Return in about 1 year (around 05/02/2018) for Thyroid check and 6 months for labs only, annual physical in 6 months.  If you have any other questions or concerns, please feel free to call the clinic or send a message through [place]. You may also schedule an earlier appointment if necessary.  Cassell Smiles, DNP, AGNP-BC Adult Gerontology Nurse Practitioner [place]  This is the antibody test that would let you know if your thyroid disease is autoimmune.  Let me know if this is something you would want [place] to check.   Antithyroid Peroxidase Antibody Test Why am I having this test? This test is used for diagnosing different thyroid diseases. Your health care provider may perform this test along with other thyroid antibody tests to aid in specific diagnoses. What kind of sample is taken? A blood sample is required for this test. It is usually collected by inserting a needle into a vein. How do I prepare for this test? There is no preparation required for this test. What are the reference ranges? Reference rangesare considered healthy rangesestablished after testing a large group of healthy people. Reference rangesmay vary among different people, labs, and hospitals. It is your responsibility to obtain your test results. Ask the lab or department performing the test when and how you will get your results. Reference ranges are as follows:  Less than 9 international units/mL for all ages. What do the results mean? Increased levels of antithyroid peroxidase antibody may indicate:  Hashimoto thyroiditis.  Rheumatoid arthritis (RA).  Hypothyroidism.  Thyroid cancer. Talk with your health care provider to discuss your results, treatment  options, and if necessary, the need for more tests. Talk with your health care provider if you have any questions about your results. Talk with your health care provider to discuss your results, treatment options, and if necessary, the need for more tests. Talk with your health care provider if you have any questions about your results. This information is not intended to replace advice given to you by your health care provider. Make sure you discuss any questions you have with your health care provider. Document Released: 12/30/2004 Document Revised: 08/09/2016 Document Reviewed: 05/29/2014 Elsevier Interactive Patient Education  2017 [employer].

## 2017-05-03 LAB — LIPID PANEL
Cholesterol: 197 mg/dL (ref <200)
HDL: 44 mg/dL — ABNORMAL LOW (ref >50)
LDL Cholesterol: 116 mg/dL — ABNORMAL HIGH (ref <100)
Total CHOL/HDL Ratio: 4.5 Ratio (ref <5.0)
Triglycerides: 186 mg/dL — ABNORMAL HIGH (ref <150)
VLDL: 37 mg/dL — ABNORMAL HIGH (ref <30)

## 2017-05-03 MED ORDER — LEVOTHYROXINE SODIUM 175 MCG PO TABS: 175.0000 ug | ORAL_TABLET | Freq: Every day | ORAL | 11 refills | Status: DC

## 2017-05-03 NOTE — Addendum Note (Signed)
Addended by: Cassell Smiles R on: 05/03/2017 01:06 PM   Modules accepted: Orders

## 2017-05-03 NOTE — Progress Notes (Signed)
Attempted to contact the pt, no answer lmom.

## 2017-05-04 NOTE — Progress Notes (Signed)
The pt was notified. No questions or concerns. Detail letter & lab results was mailed out to the pt.

## 2017-07-20 ENCOUNTER — Encounter: Payer: Self-pay | Admitting: Nurse Practitioner

## 2017-07-20 ENCOUNTER — Ambulatory Visit (INDEPENDENT_AMBULATORY_CARE_PROVIDER_SITE_OTHER): Payer: Managed Care, Other (non HMO) | Admitting: Nurse Practitioner

## 2017-07-20 VITALS — BP 124/68 | HR 102 | Temp 98.2°F | Ht 67.0 in | Wt 206.2 lb

## 2017-07-20 DIAGNOSIS — R5383 Other fatigue: Secondary | ICD-10-CM | POA: Diagnosis not present

## 2017-07-20 DIAGNOSIS — R5381 Other malaise: Secondary | ICD-10-CM | POA: Diagnosis not present

## 2017-07-20 DIAGNOSIS — R1084 Generalized abdominal pain: Secondary | ICD-10-CM

## 2017-07-20 NOTE — Progress Notes (Signed)
I have reviewed this encounter including the documentation in this note and/or discussed this patient with the provider, Cassell Smiles, AGPCNP-BC. I am certifying that I agree with the content of this note as supervising physician.  [name], [facility] 07/20/2017, 2:32 PM

## 2017-07-20 NOTE — Progress Notes (Signed)
Subjective:    Patient ID: Regina Patel, female    DOB: 09-21-1971, 46 y.o.   MRN: [id number]  Regina Patel is a 46 y.o. female presenting on 07/20/2017 for Abdominal Pain (bodyaches, severe lower abdominal cramps, and  maliase  x 2)   HPI  Abdominal Pain For last 2 days, pt notes malaise, general body aches, cramping in lower back and lower abdomen. Intermittent throughout day, at times gas / belching.  Sensation of increased abdominal pressure.   - No regular association of symptoms to meals. - Possible fever at night, chills and sweats throughout day. - Impaired sleep r/t pain. - Pt denies nausea, vomiting, diarrhea and constipation, but notes chronic tendency toward constipation w/ BM every 2-3 days  Intermittent symptoms over last 1 year with significantly less severity.  Severe episode 1 month ago w/ similar symptoms but mostly occurring at night.  Diet unchanged in recent months and no worsening/improvement of symptoms over time.  Regular exercise.  Pt w/ history of hysterectomy.  Has partial left ovary remaining.  Hx of R ovarian tumor.  Social History  Substance Use Topics  . Smoking status: Never Smoker  . Smokeless tobacco: Never Used  . Alcohol use No    Review of Systems Per HPI unless specifically indicated above     Objective:    BP 124/68 (BP Location: Right Arm, Patient Position: Sitting, Cuff Size: Normal)   Pulse (!) 102   Temp 98.2 F (36.8 C) (Oral)   Ht 5\' 7"  (1.702 m)   Wt 206 lb 3.2 oz (93.5 kg)   BMI 32.30 kg/m   Wt Readings from Last 3 Encounters:  07/20/17 206 lb 3.2 oz (93.5 kg)  05/02/17 207 lb 12.8 oz (94.3 kg)  10/06/16 202 lb 9.6 oz (91.9 kg)     Physical Exam  Constitutional: She appears well-developed and well-nourished. No distress.  HENT:  Head: Normocephalic and atraumatic.  Mouth/Throat: Oropharynx is clear and moist.  Eyes: Pupils are equal, round, and reactive to light. Conjunctivae are normal.  Neck: Normal range of motion.  Neck supple. No JVD present. No tracheal deviation present.  Cardiovascular: Normal rate, regular rhythm, normal heart sounds and intact distal pulses.   Pulmonary/Chest: Effort normal and breath sounds normal. No respiratory distress.  Abdominal: Soft. She exhibits mass. There is tenderness in the suprapubic area and left lower quadrant. There is guarding. There is no rigidity and no rebound.  Firm mass palpated in LLQ to pelvic region of abdomen  Lymphadenopathy:    She has no cervical adenopathy.    Results for orders placed or performed in visit on 05/02/17  TSH  Result Value Ref Range   TSH 1.19 mIU/L  Lipid panel  Result Value Ref Range   Cholesterol 197 <200 mg/dL   Triglycerides 186 (H) <150 mg/dL   HDL 44 (L) >50 mg/dL   Total CHOL/HDL Ratio 4.5 <5.0 Ratio   VLDL 37 (H) <30 mg/dL   LDL Cholesterol 116 (H) <100 mg/dL      Assessment & Plan:   Problem List Items Addressed This Visit    None    Visit Diagnoses    Generalized abdominal pain    -  Primary Pt w/ intermittent abdominal pain of chronic nature.  Current episode severe w/ new malaise and fatigue.  Pt w/ hx of ovarian tumor on right s/p hysterectomy w/ partial left ovary remaining - cannot exclude ovarian cancer.  Pt missed last follow up for pelvic ultrasound due  in April 2018.  Symptoms not concerning for systemic infection today, no identifiable focal infection.  History of constipation concerning for diverticulosis, but symptoms chronic in nature.  Also consider IBS constipation.  Plan: 1. Proceed with US pelvis as ordered by Dr. Enzo Bi. 2. Follow up w/ GI referral as needed if pelvic US negative. 3. Start taking miralax daily for 2-3 weeks.  May continue if improves symptoms. 4. Drink lots of fluids. 5. Labs evaluate CBC for WBC, Hgb/Hct and CMP. 6. Follow up closely by phone if symptoms worsen and in clinic in 1-3 weeks as needed.  Consider using amitiza or linzess if miralax provides some relief and  acute pain improves.   Relevant Orders   CBC with Differential/Platelet   Comprehensive metabolic panel   Malaise and fatigue See above.          Meds ordered this encounter  Medications  . acetaminophen (TYLENOL) 500 MG tablet    Sig: Take 500 mg by mouth every 6 (six) hours as needed.      Follow up plan: Return if symptoms worsen or fail to improve in 1-3 weeks.   Cassell Smiles, DNP, AGPCNP-BC Adult Gerontology Primary Care Nurse Practitioner [facility] 07/20/2017, 1:12 PM

## 2017-07-20 NOTE — Patient Instructions (Addendum)
Fumi, Thank you for coming in to clinic today.  1. For your pain: - Miralax once dose daily for next 2-3 weeks and continue if helping symptoms. - Drink plenty of water. - Call with updates if not improving over the next 1-3 weeks.  - Start taking Tylenol extra strength 1 to 2 tablets every 6-8 hours for aches or fever/chills for next few days as needed.  Do not take more than 3,000 mg in 24 hours from all medicines.   - May take Ibuprofen as well if tolerated 200-400mg  every 8 hours as needed. - Use heat and ice.  Apply this for 15 minutes at a time 6-8 times per day.     2. Follow up with your OB-GYN for your repeat Pelvic Ultrasound in the next 10 days.   3. Return to clinic tomorrow for labs between 8-11:30 am.   Please schedule a follow-up appointment with Cassell Smiles, AGNP. Return if symptoms worsen or fail to improve in 1-3 weeks.  If you have any other questions or concerns, please feel free to call the clinic or send a message through [place]. You may also schedule an earlier appointment if necessary.  You will receive a survey after today's visit either digitally by e-mail or paper by [employer]. Your experiences and feedback matter to [place].  Please respond so we know how we are doing as we provide care for you.   Cassell Smiles, DNP, AGNP-BC Adult Gerontology Nurse Practitioner [place]

## 2017-07-21 ENCOUNTER — Other Ambulatory Visit: Payer: Managed Care, Other (non HMO)

## 2017-07-21 ENCOUNTER — Telehealth: Payer: Self-pay | Admitting: Nurse Practitioner

## 2017-07-21 LAB — CBC WITH DIFFERENTIAL/PLATELET
Basophils Absolute: 0 cells/uL (ref 0–200)
Basophils Relative: 0 %
Eosinophils Absolute: 0 cells/uL — ABNORMAL LOW (ref 15–500)
Eosinophils Relative: 0 %
HCT: 35.1 % (ref 35.0–45.0)
Hemoglobin: 11 g/dL — ABNORMAL LOW (ref 11.7–15.5)
Lymphocytes Relative: 10 %
Lymphs Abs: 1740 cells/uL (ref 850–3900)
MCH: 25.6 pg — ABNORMAL LOW (ref 27.0–33.0)
MCHC: 31.3 g/dL — ABNORMAL LOW (ref 32.0–36.0)
MCV: 81.8 fL (ref 80.0–100.0)
MPV: 9.6 fL (ref 7.5–12.5)
Monocytes Absolute: 1392 cells/uL — ABNORMAL HIGH (ref 200–950)
Monocytes Relative: 8 %
Neutro Abs: 14268 cells/uL — ABNORMAL HIGH (ref 1500–7800)
Neutrophils Relative %: 82 %
Platelets: 257 10*3/uL (ref 140–400)
RBC: 4.29 MIL/uL (ref 3.80–5.10)
RDW: 15.1 % — ABNORMAL HIGH (ref 11.0–15.0)
WBC: 17.4 10*3/uL — ABNORMAL HIGH (ref 3.8–10.8)

## 2017-07-21 LAB — COMPREHENSIVE METABOLIC PANEL
ALT: 36 U/L — ABNORMAL HIGH (ref 6–29)
AST: 18 U/L (ref 10–35)
Albumin: 3.3 g/dL — ABNORMAL LOW (ref 3.6–5.1)
Alkaline Phosphatase: 174 U/L — ABNORMAL HIGH (ref 33–115)
BUN: 9 mg/dL (ref 7–25)
CO2: 22 mmol/L (ref 20–31)
Calcium: 8.8 mg/dL (ref 8.6–10.2)
Chloride: 99 mmol/L (ref 98–110)
Creat: 0.8 mg/dL (ref 0.50–1.10)
Glucose, Bld: 126 mg/dL — ABNORMAL HIGH (ref 65–99)
Potassium: 4.5 mmol/L (ref 3.5–5.3)
Sodium: 134 mmol/L — ABNORMAL LOW (ref 135–146)
Total Bilirubin: 0.8 mg/dL (ref 0.2–1.2)
Total Protein: 6.5 g/dL (ref 6.1–8.1)

## 2017-07-21 MED ORDER — METRONIDAZOLE 500 MG PO TABS: 500.0000 mg | ORAL_TABLET | Freq: Three times a day (TID) | ORAL | 0 refills | Status: DC

## 2017-07-21 MED ORDER — ONDANSETRON HCL 4 MG PO TABS: 4.0000 mg | ORAL_TABLET | Freq: Three times a day (TID) | ORAL | 0 refills | Status: DC | PRN

## 2017-07-21 MED ORDER — CIPROFLOXACIN HCL 500 MG PO TABS: 500.0000 mg | ORAL_TABLET | Freq: Two times a day (BID) | ORAL | 0 refills | Status: DC

## 2017-07-21 NOTE — Telephone Encounter (Signed)
Pt presented today for labs and provides update.  Has had nausea and vomiting with crackers yesterday.  No food today, but is afraid to eat.  Has had persistent fever.  Presume diverticulitis or gastroenteritis.   - Start ciprofloxacin 500 mg bid x 7 days - Start metronidazole 500 mg tid x 7 days  Proceed w/ abdominal CT if persistent symptoms and negative pelvic ultrasound.   Push fluids.  Call for changes.  If worsening symptoms, ER visit preferred for expedited workup.  Pt verbalized understanding.

## 2017-07-23 ENCOUNTER — Emergency Department: Payer: Managed Care, Other (non HMO)

## 2017-07-23 ENCOUNTER — Inpatient Hospital Stay
Admission: EM | Admit: 2017-07-23 | Discharge: 2017-07-26 | DRG: 392 | Disposition: A | Discharge status: Home / Self Care | Payer: Managed Care, Other (non HMO) | Attending: Surgery | Admitting: Surgery

## 2017-07-23 ENCOUNTER — Encounter: Payer: Self-pay | Admitting: Emergency Medicine

## 2017-07-23 ENCOUNTER — Inpatient Hospital Stay: Payer: Managed Care, Other (non HMO)

## 2017-07-23 DIAGNOSIS — K630 Abscess of intestine: Secondary | ICD-10-CM

## 2017-07-23 DIAGNOSIS — K567 Ileus, unspecified: Secondary | ICD-10-CM | POA: Diagnosis present

## 2017-07-23 DIAGNOSIS — Z79899 Other long term (current) drug therapy: Secondary | ICD-10-CM | POA: Diagnosis not present

## 2017-07-23 DIAGNOSIS — K5720 Diverticulitis of large intestine with perforation and abscess without bleeding: Principal | ICD-10-CM | POA: Diagnosis present

## 2017-07-23 DIAGNOSIS — K631 Perforation of intestine (nontraumatic): Secondary | ICD-10-CM | POA: Diagnosis present

## 2017-07-23 DIAGNOSIS — Z6832 Body mass index (BMI) 32.0-32.9, adult: Secondary | ICD-10-CM

## 2017-07-23 DIAGNOSIS — E039 Hypothyroidism, unspecified: Secondary | ICD-10-CM | POA: Diagnosis present

## 2017-07-23 DIAGNOSIS — E559 Vitamin D deficiency, unspecified: Secondary | ICD-10-CM | POA: Diagnosis present

## 2017-07-23 DIAGNOSIS — E7801 Familial hypercholesterolemia: Secondary | ICD-10-CM | POA: Diagnosis present

## 2017-07-23 DIAGNOSIS — E669 Obesity, unspecified: Secondary | ICD-10-CM | POA: Diagnosis present

## 2017-07-23 LAB — CBC
HEMATOCRIT: 33.9 % — AB (ref 35.0–47.0)
Hemoglobin: 11.3 g/dL — ABNORMAL LOW (ref 12.0–16.0)
MCH: 26 pg (ref 26.0–34.0)
MCHC: 33.2 g/dL (ref 32.0–36.0)
MCV: 78.2 fL — AB (ref 80.0–100.0)
Platelets: 328 10*3/uL (ref 150–440)
RBC: 4.33 MIL/uL (ref 3.80–5.20)
RDW: 15.4 % — ABNORMAL HIGH (ref 11.5–14.5)
WBC: 13.3 10*3/uL — AB (ref 3.6–11.0)

## 2017-07-23 LAB — COMPREHENSIVE METABOLIC PANEL
ALK PHOS: 246 U/L — AB (ref 38–126)
ALT: 58 U/L — AB (ref 14–54)
AST: 42 U/L — AB (ref 15–41)
Albumin: 3.2 g/dL — ABNORMAL LOW (ref 3.5–5.0)
Anion gap: 12 (ref 5–15)
BUN: 8 mg/dL (ref 6–20)
CALCIUM: 9.2 mg/dL (ref 8.9–10.3)
CO2: 22 mmol/L (ref 22–32)
CREATININE: 0.67 mg/dL (ref 0.44–1.00)
Chloride: 101 mmol/L (ref 101–111)
Glucose, Bld: 129 mg/dL — ABNORMAL HIGH (ref 65–99)
Potassium: 3.6 mmol/L (ref 3.5–5.1)
Sodium: 135 mmol/L (ref 135–145)
Total Bilirubin: 0.9 mg/dL (ref 0.3–1.2)
Total Protein: 8.3 g/dL — ABNORMAL HIGH (ref 6.5–8.1)

## 2017-07-23 LAB — URINALYSIS, COMPLETE (UACMP) WITH MICROSCOPIC
BILIRUBIN URINE: NEGATIVE
Glucose, UA: NEGATIVE mg/dL
Hgb urine dipstick: NEGATIVE
KETONES UR: 20 mg/dL — AB
LEUKOCYTES UA: NEGATIVE
Nitrite: NEGATIVE
PROTEIN: 30 mg/dL — AB
Specific Gravity, Urine: 1.028 (ref 1.005–1.030)
pH: 6 (ref 5.0–8.0)

## 2017-07-23 LAB — TROPONIN I

## 2017-07-23 LAB — LIPASE, BLOOD: Lipase: 17 U/L (ref 11–51)

## 2017-07-23 MED ORDER — FENTANYL CITRATE (PF) 100 MCG/2ML IJ SOLN
INTRAMUSCULAR | Status: AC | PRN
  Administered 2017-07-23 (×2): 25 ug via INTRAVENOUS
  Administered 2017-07-23: 50 ug via INTRAVENOUS

## 2017-07-23 MED ORDER — IOPAMIDOL (ISOVUE-300) INJECTION 61%
100.0000 mL | Freq: Once | INTRAVENOUS | Status: AC | PRN
  Administered 2017-07-23: 100 mL via INTRAVENOUS

## 2017-07-23 MED ORDER — IOPAMIDOL (ISOVUE-300) INJECTION 61%
30.0000 mL | Freq: Once | INTRAVENOUS | Status: AC | PRN
  Administered 2017-07-23: 30 mL via ORAL

## 2017-07-23 MED ORDER — ONDANSETRON HCL 4 MG/2ML IJ SOLN
4.0000 mg | Freq: Once | INTRAMUSCULAR | Status: AC
  Administered 2017-07-23: 4 mg via INTRAVENOUS
  Filled 2017-07-23: qty 2

## 2017-07-23 MED ORDER — PIPERACILLIN-TAZOBACTAM 3.375 G IVPB 30 MIN
3.3750 g | Freq: Once | INTRAVENOUS | Status: AC
  Administered 2017-07-23: 3.375 g via INTRAVENOUS

## 2017-07-23 MED ORDER — ACETAMINOPHEN 500 MG PO TABS
1000.0000 mg | ORAL_TABLET | Freq: Four times a day (QID) | ORAL | Status: DC | PRN
  Administered 2017-07-23: 1000 mg via ORAL
  Filled 2017-07-23: qty 2

## 2017-07-23 MED ORDER — PIPERACILLIN-TAZOBACTAM 3.375 G IVPB 30 MIN
INTRAVENOUS | Status: AC
  Filled 2017-07-23: qty 50

## 2017-07-23 MED ORDER — MIDAZOLAM HCL 5 MG/5ML IJ SOLN
INTRAMUSCULAR | Status: AC | PRN
  Administered 2017-07-23: 0.5 mg via INTRAVENOUS
  Administered 2017-07-23: 1 mg via INTRAVENOUS
  Administered 2017-07-23: 0.5 mg via INTRAVENOUS
  Administered 2017-07-23: 1 mg via INTRAVENOUS

## 2017-07-23 MED ORDER — SODIUM CHLORIDE 0.9 % IV SOLN
INTRAVENOUS | Status: DC
  Administered 2017-07-23: 13:00:00 via INTRAVENOUS

## 2017-07-23 MED ORDER — LACTATED RINGERS IV SOLN
125.0000 mL/h | INTRAVENOUS | Status: DC
  Administered 2017-07-23 – 2017-07-24 (×3): 125 mL/h via INTRAVENOUS

## 2017-07-23 MED ORDER — SODIUM CHLORIDE 0.9 % IV BOLUS (SEPSIS)
1000.0000 mL | Freq: Once | INTRAVENOUS | Status: AC
  Administered 2017-07-23: 1000 mL via INTRAVENOUS

## 2017-07-23 MED ORDER — HYDROMORPHONE HCL 1 MG/ML IJ SOLN
0.5000 mg | Freq: Once | INTRAMUSCULAR | Status: AC
  Administered 2017-07-23: 0.5 mg via INTRAVENOUS
  Filled 2017-07-23: qty 1

## 2017-07-23 MED ORDER — SODIUM CHLORIDE 0.9% FLUSH
5.0000 mL | Freq: Three times a day (TID) | INTRAVENOUS | Status: DC
  Administered 2017-07-23 – 2017-07-24 (×4): 5 mL via INTRAVENOUS
  Administered 2017-07-24: 22:00:00 via INTRAVENOUS
  Administered 2017-07-25 – 2017-07-26 (×4): 5 mL via INTRAVENOUS

## 2017-07-23 MED ORDER — HEPARIN SODIUM (PORCINE) 5000 UNIT/ML IJ SOLN
5000.0000 [IU] | Freq: Three times a day (TID) | INTRAMUSCULAR | Status: DC
  Administered 2017-07-23 – 2017-07-24 (×4): 5000 [IU] via SUBCUTANEOUS
  Filled 2017-07-23 (×6): qty 1

## 2017-07-23 MED ORDER — ONDANSETRON 4 MG PO TBDP: 4.0000 mg | ORAL_TABLET | Freq: Four times a day (QID) | ORAL | Status: DC | PRN

## 2017-07-23 MED ORDER — PIPERACILLIN-TAZOBACTAM 3.375 G IVPB
3.3750 g | Freq: Three times a day (TID) | INTRAVENOUS | Status: DC
  Administered 2017-07-23 – 2017-07-25 (×5): 3.375 g via INTRAVENOUS
  Filled 2017-07-23 (×7): qty 50

## 2017-07-23 MED ORDER — FENTANYL CITRATE (PF) 100 MCG/2ML IJ SOLN
INTRAMUSCULAR | Status: AC
  Filled 2017-07-23: qty 4

## 2017-07-23 MED ORDER — PANTOPRAZOLE SODIUM 40 MG IV SOLR
40.0000 mg | Freq: Every day | INTRAVENOUS | Status: DC
  Administered 2017-07-23 – 2017-07-24 (×2): 40 mg via INTRAVENOUS
  Filled 2017-07-23 (×2): qty 40

## 2017-07-23 MED ORDER — MIDAZOLAM HCL 5 MG/5ML IJ SOLN
INTRAMUSCULAR | Status: AC
  Filled 2017-07-23: qty 5

## 2017-07-23 MED ORDER — HYDROMORPHONE HCL 1 MG/ML IJ SOLN: 0.5000 mg | INTRAMUSCULAR | Status: DC | PRN

## 2017-07-23 MED ORDER — ONDANSETRON HCL 4 MG/2ML IJ SOLN: 4.0000 mg | Freq: Four times a day (QID) | INTRAMUSCULAR | Status: DC | PRN

## 2017-07-23 MED ORDER — KETOROLAC TROMETHAMINE 30 MG/ML IJ SOLN
30.0000 mg | Freq: Four times a day (QID) | INTRAMUSCULAR | Status: DC
  Administered 2017-07-23 – 2017-07-26 (×7): 30 mg via INTRAVENOUS
  Filled 2017-07-23 (×7): qty 1

## 2017-07-23 MED ORDER — MORPHINE SULFATE (PF) 4 MG/ML IV SOLN
4.0000 mg | Freq: Once | INTRAVENOUS | Status: AC
  Administered 2017-07-23: 4 mg via INTRAVENOUS
  Filled 2017-07-23: qty 1

## 2017-07-23 NOTE — H&P (Addendum)
Chief Complaint: Patient was seen in consultation today for a pelvic abscess at the request of General Surgery  Referring Physician(s): Orvilla Cornwall  Patient Status: [place] - In-pt  History of Present Illness: Regina Patel is a 46 y.o. female with one week history of feeling bad with abdominal cramping and intermittent fevers and chills.  Saw her physician earlier in the week but no improvement.  Seen in ED today and found to have a pelvic abscess, likely coming from the sigmoid colon.  Etiology for the perforation is uncertain, could be diverticular but cannot exclude a neoplastic process.  Prior hysterectomy and right oophorectomy due to a right ovarian lesion.  Occasional headaches and back pain.  Constipation, no diarrhea.   Past Medical History:  Diagnosis Date  . Hyperlipidemia   . Thyroid disease     Past Surgical History:  Procedure Laterality Date  . MOUTH SURGERY    . TOTAL ABDOMINAL HYSTERECTOMY  05/20/2014   ooperectomy unilateral    Allergies: Patient has no known allergies.  Medications: Prior to Admission medications   Medication Sig Start Date End Date Taking? Authorizing Provider  Biotin 10 MG TABS Take by mouth.   Yes [provider]  IRON, IRON, Take by mouth.   Yes [provider]  levothyroxine (SYNTHROID, LEVOTHROID) 175 MCG tablet Take 1 tablet (175 mcg total) by mouth daily before breakfast. 05/03/17  Yes Mikey College, NP  Multiple Vitamin tablet Take by mouth.   Yes [provider]  OMEGA-3 FATTY ACIDS PO Take by mouth.   Yes [provider]  acetaminophen (TYLENOL) 500 MG tablet Take 500 mg by mouth every 6 (six) hours as needed.    [provider]  ciprofloxacin (CIPRO) 500 MG tablet Take 1 tablet (500 mg total) by mouth 2 (two) times daily. Patient not taking: Reported on 07/23/2017 07/21/17 07/28/17  Mikey College, NP  metroNIDAZOLE (FLAGYL) 500 MG tablet Take 1 tablet (500 mg total) by  mouth 3 (three) times daily. Patient not taking: Reported on 07/23/2017 07/21/17 07/28/17  Mikey College, NP  ondansetron (ZOFRAN) 4 MG tablet Take 1 tablet (4 mg total) by mouth every 8 (eight) hours as needed for nausea or vomiting. Patient not taking: Reported on 07/23/2017 07/21/17   Mikey College, NP  Vitamin D, Ergocalciferol, (DRISDOL) 50000 units CAPS capsule Take 1 capsule (50,000 Units total) by mouth every 7 (seven) days. Patient not taking: Reported on 07/23/2017 04/26/16   Luciana Axe, NP     Family History  Problem Relation Age of Onset  . Breast cancer Mother 95    Social History   Social History  . Marital status: Married    Spouse name: N/A  . Number of children: N/A  . Years of education: N/A   Social History Main Topics  . Smoking status: Never Smoker  . Smokeless tobacco: Never Used  . Alcohol use No  . Drug use: No  . Sexual activity: Not Asked   Other Topics Concern  . None   Social History Narrative  . None     Review of Systems: A 12 point ROS discussed and pertinent positives are indicated in the HPI above.  All other systems are negative.  Review of Systems  Constitutional: Positive for chills, fatigue and fever.  Respiratory: Negative.   Cardiovascular: Negative.   Gastrointestinal: Positive for abdominal distention and abdominal pain.  Genitourinary: Positive for pelvic pain.  Musculoskeletal: Positive for back pain.  Vital Signs: BP 130/73 (BP Location: Left Arm)   Pulse (!) 102   Temp (!) 101.1 F (38.4 C) (Oral)   Resp 18   Wt 206 lb (93.4 kg)   SpO2 98%   BMI 32.26 kg/m   Physical Exam  Constitutional: She appears well-developed and well-nourished.  HENT:  Mouth/Throat: Oropharynx is clear and moist.  Cardiovascular: Regular rhythm, normal heart sounds and intact distal pulses.   Pulmonary/Chest: Effort normal and breath sounds normal.  Abdominal: Soft. Bowel sounds are normal. There is tenderness.  Skin: She  is not diaphoretic.    Mallampati Score:  MD Evaluation Airway: WNL Heart: WNL Abdomen: Other (comments) Abdomen comments: tenderness Chest/ Lungs: WNL ASA  Classification: 2 Mallampati/Airway Score: Two  Imaging: Ct Abdomen Pelvis W Contrast  Result Date: 07/23/2017 CLINICAL DATA:  46 year old female with progressive abdominal and pelvic pain for the last 4 days. EXAM: CT ABDOMEN AND PELVIS WITH CONTRAST TECHNIQUE: Multidetector CT imaging of the abdomen and pelvis was performed using the standard protocol following bolus administration of intravenous contrast. CONTRAST:  178mL ISOVUE-300 IOPAMIDOL (ISOVUE-300) INJECTION 61% COMPARISON:  Prior CT scan of the abdomen and pelvis 11/06/2014 FINDINGS: Lower chest: The lung bases are clear. Visualized cardiac structures are within normal limits for size. No pericardial effusion. Unremarkable visualized distal thoracic esophagus. Hepatobiliary: Normal hepatic contour and morphology. No discrete hepatic lesions. Normal appearance of the gallbladder. No intra or extrahepatic biliary ductal dilatation. Pancreas: Unremarkable. No pancreatic ductal dilatation or surrounding inflammatory changes. Spleen: Normal in size without focal abnormality. Adrenals/Urinary Tract: Adrenal glands are unremarkable. Kidneys are normal, without renal calculi, focal lesion, or hydronephrosis. Perhaps minimal fullness of the right renal collecting system. Bladder is unremarkable. Stomach/Bowel: 4.7 cm segment of circumferential irregular wall thickening involving the sigmoid colon. This is in the same region as the eccentric wall thickening which was noted on 11/06/2014. There is evidence of focal perforation with a large 6.1 x 4.4 cm fluid and gas collection within the central distal small bowel mesenteric concerning for abscess. Secondary inflammatory change results in wall narrowing of an overlying loop of small bowel resulting in diffuse fluid-filled dilatation of the  proximal loops consistent with a local reactive ileus. Extensive inflammatory stranding in the sigmoid mesocolon and small bowel mesenteric. Small mesocolon lymph node measures 6 mm in short axis. Vascular/Lymphatic: No significant atherosclerotic plaque or vascular finding. Sigmoid mesocolon lymph node is rounded and measures 6 mm in short axis. Reproductive: Surgical changes of prior hysterectomy. The right ovary is surgically absent. Left ovarian cysts measuring up to 3.1 cm. Other: No abdominal wall hernia or abnormality. No abdominopelvic ascites. Musculoskeletal: No acute fracture or aggressive appearing lytic or blastic osseous lesion. IMPRESSION: 1. Positive for colonic perforation with adjacent abscess in the distal ileal mesentery resulting in significant reactive ileus of proximal loops of small bowel as well as some a mild compression of the adjacent right ureter resulting in mild fullness of the right renal collecting system but no definite hydronephrosis. The abnormal segment of colon demonstrates circumferential irregular wall thickening which has significantly progressed compared to the findings seen on 10/2014. Overall findings are highly concerning for a perforated sigmoid colonic neoplasm versus perforated segment of chronic ischemic colitis (perhaps secondary to the reported prior abdominal surgery?). Recommend surgical consultation. 2. Sigmoid mesocolon lymph node is rounded and measures 6 mm. This could be reactive, or metastatic. 3. Surgical changes of prior hysterectomy, right oophorectomy and appendectomy. Electronically Signed   By: Dellis Filbert.D.  On: 07/23/2017 09:37    Labs:  CBC:  Recent Labs  07/20/17 0815 07/23/17 0817  WBC 17.4* 13.3*  HGB 11.0* 11.3*  HCT 35.1 33.9*  PLT 257 328    COAGS: No results for input(s): INR, APTT in the last 8760 hours.  BMP:  Recent Labs  07/20/17 0815 07/23/17 0817  NA 134* 135  K 4.5 3.6  CL 99 101  CO2 22 22    GLUCOSE 126* 129*  BUN 9 8  CALCIUM 8.8 9.2  CREATININE 0.80 0.67  GFRNONAA  --  >60  GFRAA  --  >60    LIVER FUNCTION TESTS:  Recent Labs  07/20/17 0815 07/23/17 0817  BILITOT 0.8 0.9  AST 18 42*  ALT 36* 58*  ALKPHOS 174* 246*  PROT 6.5 8.3*  ALBUMIN 3.3* 3.2*    TUMOR MARKERS: No results for input(s): AFPTM, CEA, CA199, CHROMGRNA in the last 8760 hours.  Assessment and Plan:  46 year old with a pelvic abscess likely coming from the sigmoid colon. Etiology for the abscess and bowel perforation is uncertain. There appears to be a percutaneous window for a CT-guided drain placement. CT-guided drain placement was discussed with the patient in depth. Explained the risks and benefits. Plan for CT guided drain placement within the pelvic abscess with moderate sedation.  Thank you for this interesting consult.  I greatly enjoyed meeting Makylah Bossard and look forward to participating in their care.  A copy of this report was sent to the requesting provider on this date.  Electronically Signed: Carylon Perches, MD 07/23/2017, 1:27 PM   I spent a total of 20 Minutes    in face to face in clinical consultation, greater than 50% of which was counseling/coordinating care for a pelvic abscess.

## 2017-07-23 NOTE — ED Provider Notes (Signed)
[facility] Emergency Department Provider Note ____________________________________________   I have reviewed the triage vital signs and the triage nursing note.  HISTORY  Chief Complaint Emesis   Historian Patient  HPI Alnita Endres is a 46 y.o. female with history of hysterectomy and right sal-ooph and appendectomy, presents today with abdominal pain and vomiting since this morning around 3 AM. Symptoms hit her significantly. Multiple episodes of nonbloody nonbilious emesis. Pain initially started in the epigastrium is now located in the lower abdomen. She had had several episodes like this over the past 1 week. She saw her primary provider, nurse practitioner who suggested MiraLAX for possible constipation and ibuprofen, and consider pelvic ultrasound.  Patient states that about a month ago she had one episode like this while she was camping, but it relieved on its own.  No known bad food exposures or sick contacts. No fever. No chest pain or coughing or trouble breathing.  Nausea and vomiting are moderate to severe. Pain is located now diffusely but more so in the lower abdomen is moderate to severe. Nothing makes it worse or better.   Past Medical History:  Diagnosis Date  . Hyperlipidemia   . Thyroid disease     Patient Active Problem List   Diagnosis Date Noted  . Obesity 04/22/2016  . 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 Hypothyroidism 10/02/2015  . Status post abdominal hysterectomy and right salpingo-oophorectomy 10/02/2015  . Abdominal bloating 05/03/2015  . 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 Multiple benign melanocytic nevi 05/03/2015  . Avitaminosis D 05/03/2015    Past Surgical History:  Procedure Laterality Date  . MOUTH SURGERY    . TOTAL ABDOMINAL HYSTERECTOMY  05/20/2014   ooperectomy unilateral    Prior to Admission medications   Medication Sig Start Date End Date Taking?  Authorizing Provider  Biotin 10 MG TABS Take by mouth.   Yes [provider]  IRON, IRON, Take by mouth.   Yes [provider]  levothyroxine (SYNTHROID, LEVOTHROID) 175 MCG tablet Take 1 tablet (175 mcg total) by mouth daily before breakfast. 05/03/17  Yes Mikey College, NP  Multiple Vitamin tablet Take by mouth.   Yes [provider]  OMEGA-3 FATTY ACIDS PO Take by mouth.   Yes [provider]  acetaminophen (TYLENOL) 500 MG tablet Take 500 mg by mouth every 6 (six) hours as needed.    [provider]  ciprofloxacin (CIPRO) 500 MG tablet Take 1 tablet (500 mg total) by mouth 2 (two) times daily. Patient not taking: Reported on 07/23/2017 07/21/17 07/28/17  Mikey College, NP  metroNIDAZOLE (FLAGYL) 500 MG tablet Take 1 tablet (500 mg total) by mouth 3 (three) times daily. Patient not taking: Reported on 07/23/2017 07/21/17 07/28/17  Mikey College, NP  ondansetron (ZOFRAN) 4 MG tablet Take 1 tablet (4 mg total) by mouth every 8 (eight) hours as needed for nausea or vomiting. Patient not taking: Reported on 07/23/2017 07/21/17   Mikey College, NP  Vitamin D, Ergocalciferol, (DRISDOL) 50000 units CAPS capsule Take 1 capsule (50,000 Units total) by mouth every 7 (seven) days. Patient not taking: Reported on 07/23/2017 04/26/16   Luciana Axe, NP    No Known Allergies  Family History  Problem Relation Age of Onset  . Breast cancer Mother 2    Social History Social History  Substance Use Topics  . Smoking status: Never Smoker  . Smokeless tobacco: Never Used  . Alcohol  use No    Review of Systems  Constitutional: Negative for fever. Eyes: Negative for visual changes. ENT: Negative for sore throat. Cardiovascular: Negative for chest pain. Respiratory: Negative for shortness of breath. Gastrointestinal: Negative for abdominal pain, vomiting and diarrhea. Genitourinary: Negative for dysuria. Musculoskeletal: Negative for  back pain. Skin: Negative for rash. Neurological: Negative for headache.  ____________________________________________   PHYSICAL EXAM:  VITAL SIGNS: ED Triage Vitals [07/23/17 0804]  Enc Vitals Group     BP 100/83     Pulse Rate (!) 108     Resp (!) 24     Temp 99.1 F (37.3 C)     Temp Source Oral     SpO2 100 %     Weight 206 lb (93.4 kg)     Height      Head Circumference      Peak Flow      Pain Score 7     Pain Loc      Pain Edu?      Excl. in [place]?      Constitutional: Alert and oriented. Patient breathing deeply and holding her abdomen in pain. HEENT   Head: Normocephalic and atraumatic.      Eyes: Conjunctivae are normal. Pupils equal and round.       Ears:         Nose: No congestion/rhinnorhea.   Mouth/Throat: Mucous membranes are moderately dry.   Neck: No stridor. Cardiovascular/Chest: Normal rate, regular rhythm.  No murmurs, rubs, or gallops. Respiratory: Normal respiratory effort without tachypnea nor retractions. Breath sounds are clear and equal bilaterally. No wheezes/rales/rhonchi. Gastrointestinal: Soft. No distention, no guarding, no rebound. Obese. Mild tenderness diffusely more so in the lower abdomen.  Genitourinary/rectal:Deferred Musculoskeletal: Nontender with normal range of motion in all extremities. No joint effusions.  No lower extremity tenderness.  No edema. Neurologic:  Normal speech and language. No gross or focal neurologic deficits are appreciated. Skin:  Skin is warm, dry and intact. No rash noted. Psychiatric: Mood and affect are normal. Speech and behavior are normal. Patient exhibits appropriate insight and judgment.   ____________________________________________  LABS (pertinent positives/negatives)  Labs Reviewed  COMPREHENSIVE METABOLIC PANEL - Abnormal; Notable for the following:       Result Value   Glucose, Bld 129 (*)    Total Protein 8.3 (*)    Albumin 3.2 (*)    AST 42 (*)    ALT 58 (*)    Alkaline  Phosphatase 246 (*)    All other components within normal limits  CBC - Abnormal; Notable for the following:    WBC 13.3 (*)    Hemoglobin 11.3 (*)    HCT 33.9 (*)    MCV 78.2 (*)    RDW 15.4 (*)    All other components within normal limits  LIPASE, BLOOD  TROPONIN I  URINALYSIS, COMPLETE (UACMP) WITH MICROSCOPIC    ____________________________________________    EKG I, Lisa Roca, MD, the attending physician have personally viewed and interpreted all ECGs.  100 bpm. Sinus tachycardia. Narrow QRS. Normal axis. Nonspecific ST and T-wave ____________________________________________  RADIOLOGY All Xrays were viewed by me. Imaging interpreted by Radiologist.  CT abdomen and pelvis with contrast:    CLINICAL DATA: 46 year old female with progressive abdominal and pelvic pain for the last 4 days.  EXAM: CT ABDOMEN AND PELVIS WITH CONTRAST  TECHNIQUE: Multidetector CT imaging of the abdomen and pelvis was performed using the standard protocol following bolus administration of intravenous contrast.  CONTRAST: 126mL  ISOVUE-300 IOPAMIDOL (ISOVUE-300) INJECTION 61%  COMPARISON: Prior CT scan of the abdomen and pelvis 11/06/2014  FINDINGS: Lower chest: The lung bases are clear. Visualized cardiac structures are within normal limits for size. No pericardial effusion. Unremarkable visualized distal thoracic esophagus.  Hepatobiliary: Normal hepatic contour and morphology. No discrete hepatic lesions. Normal appearance of the gallbladder. No intra or extrahepatic biliary ductal dilatation.  Pancreas: Unremarkable. No pancreatic ductal dilatation or surrounding inflammatory changes.  Spleen: Normal in size without focal abnormality.  Adrenals/Urinary Tract: Adrenal glands are unremarkable. Kidneys are normal, without renal calculi, focal lesion, or hydronephrosis. Perhaps minimal fullness of the right renal collecting system. Bladder is unremarkable.  Stomach/Bowel:  4.7 cm segment of circumferential irregular wall thickening involving the sigmoid colon. This is in the same region as the eccentric wall thickening which was noted on 11/06/2014. There is evidence of focal perforation with a large 6.1 x 4.4 cm fluid and gas collection within the central distal small bowel mesenteric concerning for abscess. Secondary inflammatory change results in wall narrowing of an overlying loop of small bowel resulting in diffuse fluid-filled dilatation of the proximal loops consistent with a local reactive ileus. Extensive inflammatory stranding in the sigmoid mesocolon and small bowel mesenteric. Small mesocolon lymph node measures 6 mm in short axis.  Vascular/Lymphatic: No significant atherosclerotic plaque or vascular finding. Sigmoid mesocolon lymph node is rounded and measures 6 mm in short axis.  Reproductive: Surgical changes of prior hysterectomy. The right ovary is surgically absent. Left ovarian cysts measuring up to 3.1 cm.  Other: No abdominal wall hernia or abnormality. No abdominopelvic ascites.  Musculoskeletal: No acute fracture or aggressive appearing lytic or blastic osseous lesion.  IMPRESSION: 1. Positive for colonic perforation with adjacent abscess in the distal ileal mesentery resulting in significant reactive ileus of proximal loops of small bowel as well as some a mild compression of the adjacent right ureter resulting in mild fullness of the right renal collecting system but no definite hydronephrosis. The abnormal segment of colon demonstrates circumferential irregular wall thickening which has significantly progressed compared to the findings seen on 10/2014. Overall findings are highly concerning for a perforated sigmoid colonic neoplasm versus perforated segment of chronic ischemic colitis (perhaps secondary to the reported prior abdominal surgery?). Recommend surgical consultation. 2. Sigmoid mesocolon lymph node is rounded  and measures 6 mm. This could be reactive, or metastatic. 3. Surgical changes of prior hysterectomy, right oophorectomy and appendectomy.      __________________________________________  PROCEDURES  Procedure(s) performed: None  Critical Care performed: None  ____________________________________________   ED COURSE / ASSESSMENT AND PLAN  Pertinent labs & imaging results that were available during my care of the patient were reviewed by me and considered in my medical decision making (see chart for details).    Patient appears very uncomfortable, but without acute abdomen, but diffusely significantly tender.  Elevated white blood cell count.  LFTs are mildly elevated. I decided to start with CT scan.  I reviewed the CT scan myself, and reviewed radiologist's report showing colonic perforation and abscess. I consulted and spoke with surgical nurse to Dr. Hampton Abbot who will see the patient in the emergency department.  I updated patient and family. Patient will be started on Zosyn.   CONSULTATIONS:  Dr. Hampton Abbot, Gen Surg for consult and admission.   Patient / Family / Caregiver informed of clinical course, medical decision-making process, and agree with plan.   ___________________________________________   FINAL CLINICAL IMPRESSION(S) / ED DIAGNOSES   Final diagnoses:  Abscess of sigmoid colon  Bowel perforation [facility])              Note: This dictation was prepared with Dragon dictation. Any transcriptional errors that result from this process are unintentional    Lisa Roca, MD 07/23/17 1026

## 2017-07-23 NOTE — ED Triage Notes (Signed)
Pt to ed with c/o vomiting that started about 230 am last night.  Pt states vomited about 4 times since then, reports pain in right lower quad, severe.

## 2017-07-23 NOTE — ED Notes (Signed)
Dr. Hampton Abbot at bedside at this time. Pt visualized in NAD. Family remains at bedside at this time. Will continue to monitor for further patient needs.

## 2017-07-23 NOTE — ED Notes (Signed)
Pt returned from CT at this time.  

## 2017-07-23 NOTE — ED Notes (Signed)
NAD noted at this time. Pt resting in bed with family at bedside. Pt resting comfortably in bed. Will continue to monitor for further patient needs.

## 2017-07-23 NOTE — ED Notes (Signed)
This RN spoke with Dr. Hampton Abbot, pt has fever 101.1, orders received for Tylenol 1000mg .

## 2017-07-23 NOTE — Procedures (Signed)
CT guided drainage of the lower abdominal/pelvic abscess.  Removed thick, brown, purulent, foul smelling fluid.  Minimal blood loss, no immediate complication. Send fluid for culture. Follow drainage and will flush catheter in order to help drainage.

## 2017-07-23 NOTE — ED Notes (Signed)
Dr. Lord at bedside at this time.  

## 2017-07-23 NOTE — H&P (Addendum)
Date of Admission:  07/23/2017  Reason for Admission:  Diverticulitis with abscess  History of Present Illness: Regina Patel is a 46 y.o. female who presents with almost a one-week history of lower abdominal pain. She was seen at her PCPs office on 8/1 and after lab work was done it appears that she had been ordered and antibiotic course of Cipro and Flagyl but she never picked up the antibiotics. She reports that her pain initially was intermittent in nature in the lower abdomen and her last few days has been worsening. She has been having intermittent subjective fevers with chills. Last night she had multiple episodes of nausea and vomiting and this morning presented to the emergency room for further evaluation. She reports that over the past year she's had very intermittent episodes of lower abdominal pain associated with mild fevers that would go away on the round in the similar area as the pain is this week. Nothing has been able to help with her pain and symptoms at home. The pain does not radiate anywhere and is in the low abdomen, particularly the left lower quadrant.  In the emergency room she had workup including laboratory studies as well as a CT scan. Her white blood cell count is 13.3 which actually is down from 8/1 during which it was 17.4. On CT scan she has area of focal perforation around the sigmoid colon with a large adjacent abscess measuring 6.1 x 4.4 cm.  Past Medical History: Past Medical History:  Diagnosis Date  . Hyperlipidemia   . Thyroid disease      Past Surgical History: Past Surgical History:  Procedure Laterality Date  . MOUTH SURGERY    . TOTAL ABDOMINAL HYSTERECTOMY  05/20/2014   ooperectomy unilateral    Home Medications: Prior to Admission medications   Medication Sig Start Date End Date Taking? Authorizing Provider  Biotin 10 MG TABS Take by mouth.   Yes [provider]  IRON, IRON, Take by mouth.   Yes [provider]  levothyroxine  (SYNTHROID, LEVOTHROID) 175 MCG tablet Take 1 tablet (175 mcg total) by mouth daily before breakfast. 05/03/17  Yes Mikey College, NP  Multiple Vitamin tablet Take by mouth.   Yes [provider]  OMEGA-3 FATTY ACIDS PO Take by mouth.   Yes [provider]  acetaminophen (TYLENOL) 500 MG tablet Take 500 mg by mouth every 6 (six) hours as needed.    [provider]  ciprofloxacin (CIPRO) 500 MG tablet Take 1 tablet (500 mg total) by mouth 2 (two) times daily. Patient not taking: Reported on 07/23/2017 07/21/17 07/28/17  Mikey College, NP  metroNIDAZOLE (FLAGYL) 500 MG tablet Take 1 tablet (500 mg total) by mouth 3 (three) times daily. Patient not taking: Reported on 07/23/2017 07/21/17 07/28/17  Mikey College, NP  ondansetron (ZOFRAN) 4 MG tablet Take 1 tablet (4 mg total) by mouth every 8 (eight) hours as needed for nausea or vomiting. Patient not taking: Reported on 07/23/2017 07/21/17   Mikey College, NP  Vitamin D, Ergocalciferol, (DRISDOL) 50000 units CAPS capsule Take 1 capsule (50,000 Units total) by mouth every 7 (seven) days. Patient not taking: Reported on 07/23/2017 04/26/16   Luciana Axe, NP    Allergies: No Known Allergies  Social History:  reports that she has never smoked. She has never used smokeless tobacco. She reports that she does not drink alcohol or use drugs.   Family History: Family History  Problem Relation Age of Onset  .  Breast cancer Mother 28    Review of Systems: Review of Systems  Constitutional: Positive for chills and fever.  HENT: Negative for hearing loss.   Eyes: Negative for blurred vision.  Respiratory: Negative for shortness of breath.   Cardiovascular: Negative for chest pain.  Gastrointestinal: Positive for abdominal pain, constipation, nausea and vomiting. Negative for diarrhea.  Genitourinary: Negative for dysuria.  Musculoskeletal: Negative for myalgias.  Skin: Negative for rash.  Neurological:  Negative for dizziness.  Psychiatric/Behavioral: Negative for depression.  All other systems reviewed and are negative.   Physical Exam BP 134/82   Pulse (!) 102   Temp 99.1 F (37.3 C) (Oral)   Resp (!) 26   Wt 93.4 kg (206 lb)   SpO2 99%   BMI 32.26 kg/m  CONSTITUTIONAL: No acute distress HEENT:  Normocephalic, atraumatic, extraocular motion intact. NECK: Trachea is midline, and there is no jugular venous distension.  RESPIRATORY:  Lungs are clear, and breath sounds are equal bilaterally. Normal respiratory effort without pathologic use of accessory muscles. CARDIOVASCULAR: Heart is regular rhythm with low grade tachycardia, without murmurs, gallops, or rubs. GI: The abdomen is soft, mildly distended, with tenderness to palpation particularly in the left lower quadrant and low mid abdomen. There were no palpable masses. Has low vertical scar from prior hysterectomy. MUSCULOSKELETAL:  Normal muscle strength and tone in all four extremities.  No peripheral edema or cyanosis. SKIN: Skin turgor is normal. There are no pathologic skin lesions.  NEUROLOGIC:  Motor and sensation is grossly normal.  Cranial nerves are grossly intact. PSYCH:  Alert and oriented to person, place and time. Affect is normal.  Laboratory Analysis: Results for orders placed or performed during the hospital encounter of 07/23/17 (from the past 24 hour(s))  Lipase, blood     Status: None   Collection Time: 07/23/17  8:17 AM  Result Value Ref Range   Lipase 17 11 - 51 U/L  Comprehensive metabolic panel     Status: Abnormal   Collection Time: 07/23/17  8:17 AM  Result Value Ref Range   Sodium 135 135 - 145 mmol/L   Potassium 3.6 3.5 - 5.1 mmol/L   Chloride 101 101 - 111 mmol/L   CO2 22 22 - 32 mmol/L   Glucose, Bld 129 (H) 65 - 99 mg/dL   BUN 8 6 - 20 mg/dL   Creatinine, Ser 0.67 0.44 - 1.00 mg/dL   Calcium 9.2 8.9 - 10.3 mg/dL   Total Protein 8.3 (H) 6.5 - 8.1 g/dL   Albumin 3.2 (L) 3.5 - 5.0 g/dL   AST  42 (H) 15 - 41 U/L   ALT 58 (H) 14 - 54 U/L   Alkaline Phosphatase 246 (H) 38 - 126 U/L   Total Bilirubin 0.9 0.3 - 1.2 mg/dL   GFR calc non Af Amer >60 >60 mL/min   GFR calc Af Amer >60 >60 mL/min   Anion gap 12 5 - 15  CBC     Status: Abnormal   Collection Time: 07/23/17  8:17 AM  Result Value Ref Range   WBC 13.3 (H) 3.6 - 11.0 K/uL   RBC 4.33 3.80 - 5.20 MIL/uL   Hemoglobin 11.3 (L) 12.0 - 16.0 g/dL   HCT 33.9 (L) 35.0 - 47.0 %   MCV 78.2 (L) 80.0 - 100.0 fL   MCH 26.0 26.0 - 34.0 pg   MCHC 33.2 32.0 - 36.0 g/dL   RDW 15.4 (H) 11.5 - 14.5 %   Platelets 328 150 -  440 K/uL  Urinalysis, Complete w Microscopic     Status: Abnormal   Collection Time: 07/23/17  8:17 AM  Result Value Ref Range   Color, Urine YELLOW (A) YELLOW   APPearance CLOUDY (A) CLEAR   Specific Gravity, Urine 1.028 1.005 - 1.030   pH 6.0 5.0 - 8.0   Glucose, UA NEGATIVE NEGATIVE mg/dL   Hgb urine dipstick NEGATIVE NEGATIVE   Bilirubin Urine NEGATIVE NEGATIVE   Ketones, ur 20 (A) NEGATIVE mg/dL   Protein, ur 30 (A) NEGATIVE mg/dL   Nitrite NEGATIVE NEGATIVE   Leukocytes, UA NEGATIVE NEGATIVE   RBC / HPF 0-5 0 - 5 RBC/hpf   WBC, UA 0-5 0 - 5 WBC/hpf   Bacteria, UA FEW (A) NONE SEEN   Squamous Epithelial / LPF TOO NUMEROUS TO COUNT (A) NONE SEEN   Mucous PRESENT   Troponin I     Status: None   Collection Time: 07/23/17  8:18 AM  Result Value Ref Range   Troponin I <0.03 <0.03 ng/mL    Imaging: Ct Abdomen Pelvis W Contrast  Result Date: 07/23/2017 CLINICAL DATA:  46 year old female with progressive abdominal and pelvic pain for the last 4 days. EXAM: CT ABDOMEN AND PELVIS WITH CONTRAST TECHNIQUE: Multidetector CT imaging of the abdomen and pelvis was performed using the standard protocol following bolus administration of intravenous contrast. CONTRAST:  167mL ISOVUE-300 IOPAMIDOL (ISOVUE-300) INJECTION 61% COMPARISON:  Prior CT scan of the abdomen and pelvis 11/06/2014 FINDINGS: Lower chest: The lung bases  are clear. Visualized cardiac structures are within normal limits for size. No pericardial effusion. Unremarkable visualized distal thoracic esophagus. Hepatobiliary: Normal hepatic contour and morphology. No discrete hepatic lesions. Normal appearance of the gallbladder. No intra or extrahepatic biliary ductal dilatation. Pancreas: Unremarkable. No pancreatic ductal dilatation or surrounding inflammatory changes. Spleen: Normal in size without focal abnormality. Adrenals/Urinary Tract: Adrenal glands are unremarkable. Kidneys are normal, without renal calculi, focal lesion, or hydronephrosis. Perhaps minimal fullness of the right renal collecting system. Bladder is unremarkable. Stomach/Bowel: 4.7 cm segment of circumferential irregular wall thickening involving the sigmoid colon. This is in the same region as the eccentric wall thickening which was noted on 11/06/2014. There is evidence of focal perforation with a large 6.1 x 4.4 cm fluid and gas collection within the central distal small bowel mesenteric concerning for abscess. Secondary inflammatory change results in wall narrowing of an overlying loop of small bowel resulting in diffuse fluid-filled dilatation of the proximal loops consistent with a local reactive ileus. Extensive inflammatory stranding in the sigmoid mesocolon and small bowel mesenteric. Small mesocolon lymph node measures 6 mm in short axis. Vascular/Lymphatic: No significant atherosclerotic plaque or vascular finding. Sigmoid mesocolon lymph node is rounded and measures 6 mm in short axis. Reproductive: Surgical changes of prior hysterectomy. The right ovary is surgically absent. Left ovarian cysts measuring up to 3.1 cm. Other: No abdominal wall hernia or abnormality. No abdominopelvic ascites. Musculoskeletal: No acute fracture or aggressive appearing lytic or blastic osseous lesion. IMPRESSION: 1. Positive for colonic perforation with adjacent abscess in the distal ileal mesentery  resulting in significant reactive ileus of proximal loops of small bowel as well as some a mild compression of the adjacent right ureter resulting in mild fullness of the right renal collecting system but no definite hydronephrosis. The abnormal segment of colon demonstrates circumferential irregular wall thickening which has significantly progressed compared to the findings seen on 10/2014. Overall findings are highly concerning for a perforated sigmoid colonic neoplasm versus perforated  segment of chronic ischemic colitis (perhaps secondary to the reported prior abdominal surgery?). Recommend surgical consultation. 2. Sigmoid mesocolon lymph node is rounded and measures 6 mm. This could be reactive, or metastatic. 3. Surgical changes of prior hysterectomy, right oophorectomy and appendectomy. Electronically Signed   By: Jacqulynn Cadet M.D.   On: 07/23/2017 09:37    Assessment and Plan: This is a 46 y.o. female who presents with likely acute diverticulitis with a large pericolonic abscess. I have independently reviewed the patient's imaging study as well as reviewed her laboratory studies. Overall I believe this is more likely to be diverticulitis with perforation given her history and symptoms. Currently there is no free air on the CT scan but there is a large abscess measuring 6.1 x 4.4 cm.  The patient will be admitted to general surgery team. She will be nothing by mouth with IV fluid hydration. She has been started on IV Zosyn and will be continued throughout her stay. She will have appropriate pain and nausea control as well.  Given her large abscess, I did discuss with Dr. Anselm Pancoast with radiology for percutaneous drainage which will be attempted today. The patient is aware that if there is no improvement in her symptoms with conservative management, that she may require to go to the operating room. Otherwise if she does improve, did discuss with the patient that she will need a colonoscopy in the future  to rule out any potential malignancy causing this perforation and abscess. She continues to improve, will start advancing her diet, although CT scan there is mild evidence of an ileus due to the abscess and so we'll go slow with her diet.  Patient understands this plan and all of her questions have been answered.   Melvyn Neth, [place]

## 2017-07-23 NOTE — ED Notes (Addendum)
Pt currently in specials at this time. Report given to [place], Therapist, sports. [place], RN notified of patient location and that patient would be coming to the floor after procedure was completed.

## 2017-07-24 LAB — CBC WITH DIFFERENTIAL/PLATELET
Basophils Absolute: 0 10*3/uL (ref 0–0.1)
Basophils Relative: 0 %
EOS ABS: 0.1 10*3/uL (ref 0–0.7)
EOS PCT: 2 %
HCT: 26.4 % — ABNORMAL LOW (ref 35.0–47.0)
Hemoglobin: 8.8 g/dL — ABNORMAL LOW (ref 12.0–16.0)
LYMPHS ABS: 1.5 10*3/uL (ref 1.0–3.6)
Lymphocytes Relative: 22 %
MCH: 26.4 pg (ref 26.0–34.0)
MCHC: 33.5 g/dL (ref 32.0–36.0)
MCV: 78.9 fL — ABNORMAL LOW (ref 80.0–100.0)
MONOS PCT: 9 %
Monocytes Absolute: 0.6 10*3/uL (ref 0.2–0.9)
Neutro Abs: 4.4 10*3/uL (ref 1.4–6.5)
Neutrophils Relative %: 67 %
PLATELETS: 229 10*3/uL (ref 150–440)
RBC: 3.35 MIL/uL — AB (ref 3.80–5.20)
RDW: 15.8 % — ABNORMAL HIGH (ref 11.5–14.5)
WBC: 6.6 10*3/uL (ref 3.6–11.0)

## 2017-07-24 LAB — BASIC METABOLIC PANEL
Anion gap: 5 (ref 5–15)
BUN: 11 mg/dL (ref 6–20)
CALCIUM: 7.9 mg/dL — AB (ref 8.9–10.3)
CO2: 27 mmol/L (ref 22–32)
CREATININE: 0.8 mg/dL (ref 0.44–1.00)
Chloride: 104 mmol/L (ref 101–111)
GFR calc Af Amer: >60 mL/min (ref >60)
Glucose, Bld: 97 mg/dL (ref 65–99)
Potassium: 3.7 mmol/L (ref 3.5–5.1)
Sodium: 136 mmol/L (ref 135–145)

## 2017-07-24 LAB — MAGNESIUM: Magnesium: 2.1 mg/dL (ref 1.7–2.4)

## 2017-07-24 MED ORDER — LACTATED RINGERS IV SOLN
INTRAVENOUS | Status: DC
  Administered 2017-07-24 – 2017-07-25 (×3): via INTRAVENOUS

## 2017-07-24 MED ORDER — LEVOTHYROXINE SODIUM 175 MCG PO TABS
175.0000 ug | ORAL_TABLET | Freq: Every day | ORAL | Status: DC
  Administered 2017-07-25 – 2017-07-26 (×2): 175 ug via ORAL
  Filled 2017-07-24 (×2): qty 1

## 2017-07-24 NOTE — Progress Notes (Signed)
07/24/2017  Subjective: No acute events overnight.  Patient had percutaneous abscess drainage yesterday.  Feels significantly better today, with no significant pain this morning.  Vital signs: Temp:  [97.7 F (36.5 C)-98 F (36.7 C)] 98 F (36.7 C) (08/05 1230) Pulse Rate:  [69-74] 72 (08/05 1230) Resp:  [16-20] 18 (08/05 1230) BP: (95-110)/(56-64) 110/64 (08/05 1230) SpO2:  [98 %-99 %] 98 % (08/05 1230)   Intake/Output: 08/04 0701 - 08/05 0700 In: 1215.7 [I.V.:1165.7; IV Piggyback:50] Out: 320 [Urine:250; Drains:70]    Physical Exam: Constitutional: No acute distress Abdomen:  Soft, nondistended, nontender to palpation.  Drain with seropurulent fluid.  Labs:   Recent Labs  07/23/17 0817 07/24/17 0402  WBC 13.3* 6.6  HGB 11.3* 8.8*  HCT 33.9* 26.4*  PLT 328 229    Recent Labs  07/23/17 0817 07/24/17 0402  NA 135 136  K 3.6 3.7  CL 101 104  CO2 22 27  GLUCOSE 129* 97  BUN 8 11  CREATININE 0.67 0.80  CALCIUM 9.2 7.9*   No results for input(s): LABPROT, INR in the last 72 hours.  Imaging: No results found.  Assessment/Plan: 46 yo female with diverticulitis with abscess  --Advance diet to clear liquids today --continue IV antibiotics.  Anticipate transition to oral antibiotics prior to discharge pending culture results --resume home medications --discontinue IV fluid when tolerating good po.   Melvyn Neth, [place]

## 2017-07-25 DIAGNOSIS — K630 Abscess of intestine: Secondary | ICD-10-CM

## 2017-07-25 LAB — CBC WITH DIFFERENTIAL/PLATELET
BASOS PCT: 0 %
Basophils Absolute: 0 10*3/uL (ref 0–0.1)
Eosinophils Absolute: 0.1 10*3/uL (ref 0–0.7)
Eosinophils Relative: 2 %
HEMATOCRIT: 25.2 % — AB (ref 35.0–47.0)
Hemoglobin: 8.3 g/dL — ABNORMAL LOW (ref 12.0–16.0)
Lymphocytes Relative: 32 %
Lymphs Abs: 1.5 10*3/uL (ref 1.0–3.6)
MCH: 25.6 pg — ABNORMAL LOW (ref 26.0–34.0)
MCHC: 32.9 g/dL (ref 32.0–36.0)
MCV: 77.8 fL — AB (ref 80.0–100.0)
MONO ABS: 0.4 10*3/uL (ref 0.2–0.9)
MONOS PCT: 9 %
NEUTROS ABS: 2.7 10*3/uL (ref 1.4–6.5)
Neutrophils Relative %: 57 %
Platelets: 254 10*3/uL (ref 150–440)
RBC: 3.24 MIL/uL — ABNORMAL LOW (ref 3.80–5.20)
RDW: 15.4 % — AB (ref 11.5–14.5)
WBC: 4.8 10*3/uL (ref 3.6–11.0)

## 2017-07-25 MED ORDER — METRONIDAZOLE 500 MG PO TABS
500.0000 mg | ORAL_TABLET | Freq: Three times a day (TID) | ORAL | Status: DC
  Administered 2017-07-25 – 2017-07-26 (×3): 500 mg via ORAL
  Filled 2017-07-25 (×5): qty 1

## 2017-07-25 MED ORDER — PANTOPRAZOLE SODIUM 40 MG PO TBEC
40.0000 mg | DELAYED_RELEASE_TABLET | Freq: Every day | ORAL | Status: DC
  Administered 2017-07-25 – 2017-07-26 (×2): 40 mg via ORAL
  Filled 2017-07-25 (×2): qty 1

## 2017-07-25 MED ORDER — CIPROFLOXACIN HCL 500 MG PO TABS
500.0000 mg | ORAL_TABLET | Freq: Two times a day (BID) | ORAL | Status: DC
  Administered 2017-07-25 – 2017-07-26 (×2): 500 mg via ORAL
  Filled 2017-07-25 (×2): qty 1

## 2017-07-25 NOTE — Progress Notes (Signed)
CC: Diverticulitis with abscess Subjective: Feeling better. Some intermittent abdominal pain. Taking clears.  Objective: Vital signs in last 24 hours: Temp:  [98 F (36.7 C)-98.2 F (36.8 C)] 98.2 F (36.8 C) (08/06 0500) Pulse Rate:  [68-82] 68 (08/06 0500) Resp:  [16-20] 16 (08/06 0500) BP: (110-127)/(48-67) 127/67 (08/06 0500) SpO2:  [96 %-98 %] 98 % (08/06 0500) Last BM Date: 07/21/17  Intake/Output from previous day: 08/05 0701 - 08/06 0700 In: 2677.4 [I.V.:2577.4; IV Piggyback:100] Out: 1025 [Urine:1000; Drains:25] Intake/Output this shift: No intake/output data recorded.  Physical exam: NAD, awake  Abd: soft, drain in place, purulent content. No peritonitis Ext: well perfused, no edema  Lab Results: CBC   Recent Labs  07/24/17 0402 07/25/17 0345  WBC 6.6 4.8  HGB 8.8* 8.3*  HCT 26.4* 25.2*  PLT 229 254   BMET  Recent Labs  07/23/17 0817 07/24/17 0402  NA 135 136  K 3.6 3.7  CL 101 104  CO2 22 27  GLUCOSE 129* 97  BUN 8 11  CREATININE 0.67 0.80  CALCIUM 9.2 7.9*   PT/INR No results for input(s): LABPROT, INR in the last 72 hours. ABG No results for input(s): PHART, HCO3 in the last 72 hours.  Invalid input(s): PCO2, PO2  Studies/Results: Ct Image Guided Drainage By Percutaneous Catheter  Result Date: 07/23/2017 INDICATION: 46 year old with an abscess in lower abdomen/pelvis. Source is likely from the sigmoid colon. Patient needs percutaneous drainage. EXAM: CT GUIDED DRAINAGE OF A PELVIC ABSCESS MEDICATIONS: The patient is currently admitted to the hospital and receiving intravenous antibiotics. ANESTHESIA/SEDATION: 3 mg IV Versed 100 mcg IV Fentanyl Moderate Sedation Time:  17 minutes The patient was continuously monitored during the procedure by the interventional radiology nurse under my direct supervision. COMPLICATIONS: None immediate. TECHNIQUE: Informed written consent was obtained from the patient after a thorough discussion of the  procedural risks, benefits and alternatives. All questions were addressed. Maximal Sterile Barrier Technique was utilized including caps, mask, sterile gowns, sterile gloves, sterile drape, hand hygiene and skin antiseptic. A timeout was performed prior to the initiation of the procedure. PROCEDURE: Patient was placed supine on the CT scanner. Images through the lower abdomen and pelvis were obtained. The abscess was identified. The anterior pelvic region was prepped with chlorhexidine and sterile field was created. Skin and soft tissue were anesthetized with 1% lidocaine. 77 gauge trocar needle was directed into the abscess collection with CT guidance. Thick brown purulent fluid was aspirated. A stiff Amplatz wire was placed. The tract was dilated to accommodate a 10.2 [place] multipurpose drain. Brown, thick purulent, foul-smelling fluid was removed. The catheter was flushed with saline and attached to a suction bulb. Sample sent for Gram stain and culture. FINDINGS: Irregular air-fluid collection in the lower abdomen and pelvis. Findings compatible with an abscess. Brown purulent fluid was removed. IMPRESSION: Successful CT-guided drain placement within the pelvic abscess. Electronically Signed   By: Markus Daft M.D.   On: 07/23/2017 15:30    Anti-infectives: Anti-infectives    Start     Dose/Rate Route Frequency Ordered Stop   07/23/17 1145  piperacillin-tazobactam (ZOSYN) IVPB 3.375 g     3.375 g 12.5 mL/hr over 240 Minutes Intravenous Every 8 hours 07/23/17 1135     07/23/17 1015  piperacillin-tazobactam (ZOSYN) IVPB 3.375 g     3.375 g 100 mL/hr over 30 Minutes Intravenous  Once 07/23/17 1006 07/23/17 1108      Assessment/Plan:Complicated diverticulitis with abscess responded to medical therapy and percutaneous drainage.  No need for emergent surgical intervention. We will advance her diet and continue antibiotics. Likely discharge in the morning  She will benefit from elective colectomy as an  [place], MD, [facility]  07/25/2017

## 2017-07-26 ENCOUNTER — Telehealth: Payer: Self-pay

## 2017-07-26 MED ORDER — HYDROCODONE-ACETAMINOPHEN 5-325 MG PO TABS: 1.0000 | ORAL_TABLET | ORAL | 0 refills | Status: DC | PRN

## 2017-07-26 MED ORDER — METRONIDAZOLE 500 MG PO TABS: 500.0000 mg | ORAL_TABLET | Freq: Three times a day (TID) | ORAL | 0 refills | Status: DC

## 2017-07-26 MED ORDER — CIPROFLOXACIN HCL 500 MG PO TABS: 500.0000 mg | ORAL_TABLET | Freq: Two times a day (BID) | ORAL | 0 refills | Status: DC

## 2017-07-26 NOTE — Telephone Encounter (Signed)
Post-op call made to patient at this time. No answer. Left voicemail for return phone call.  

## 2017-07-26 NOTE — Discharge Instructions (Signed)

## 2017-07-26 NOTE — Progress Notes (Signed)
Discharge instructions given and answered all questions. She is knowledgeable and able to teach back JP drain care and symptoms to report. Medication info and new RX  to pickup at [place]. Follow up appt dates and times in packet. Her husband is at bedside for instruction also. Discharged by volunteer services via wheelchair.

## 2017-07-26 NOTE — Discharge Summary (Signed)
Patient ID: Regina Patel MRN: [id number] DOB/AGE: October 29, 1971 46 y.o.  Admit date: 07/23/2017 Discharge date: 07/26/2017   Discharge Diagnoses:  Active Problems:   Diverticulitis of large intestine with abscess   Abscess of sigmoid colon   Procedures: Percutaneous drainage of pelvic abscess  Hospital Course: 46 year old female admitted with left lower quadrant pain and CT scan consistent with complicated diverticulitis with abscess. Patient was admitted for IV antibiotics and placement of percutaneous drain by interventional radiology. Patient underwent a successful drainage and her clinical condition improved with medical management. Her pain significantly improved and the drain output decreased although it was still purulent fluid. At the time of discharge she was ambulating, tolerating regular diet and she was afebrile. Her white count was normal. Her physical exam showed a female in no acute distress awake and alert. Abdomen: Soft nontender with drain in place with some murky fluid. No peritonitis. Extremities: No edema well perfused. Condition at time of discharge stable. She knows to follow up in the office next week and she will benefit from elective sigmoid colectomy and colonoscopy    Disposition: 01-Home or Self Care  Discharge Instructions    Call MD for:  difficulty breathing, headache or visual disturbances    Complete by:  As directed    Call MD for:  extreme fatigue    Complete by:  As directed    Call MD for:  hives    Complete by:  As directed    Call MD for:  persistant dizziness or light-headedness    Complete by:  As directed    Call MD for:  persistant nausea and vomiting    Complete by:  As directed    Call MD for:  redness, tenderness, or signs of infection (pain, swelling, redness, odor or green/yellow discharge around incision site)    Complete by:  As directed    Call MD for:  severe uncontrolled pain    Complete by:  As directed    Call MD for:  temperature  >100.4    Complete by:  As directed    Diet - low sodium heart healthy    Complete by:  As directed    Discharge instructions    Complete by:  As directed    Please teach pt about JP care   Increase activity slowly    Complete by:  As directed      Allergies as of 07/26/2017   No Known Allergies     Medication List    TAKE these medications   acetaminophen 500 MG tablet Commonly known as:  TYLENOL Take 500 mg by mouth every 6 (six) hours as needed.   Biotin 10 MG Tabs Take by mouth.   ciprofloxacin 500 MG tablet Commonly known as:  CIPRO Take 1 tablet (500 mg total) by mouth 2 (two) times daily.   HYDROcodone-acetaminophen 5-325 MG tablet Commonly known as:  NORCO/VICODIN Take 1-2 tablets by mouth every 4 (four) hours as needed for moderate pain.   IRON (IRON) Take by mouth.   levothyroxine 175 MCG tablet Commonly known as:  SYNTHROID, LEVOTHROID Take 1 tablet (175 mcg total) by mouth daily before breakfast.   metroNIDAZOLE 500 MG tablet Commonly known as:  FLAGYL Take 1 tablet (500 mg total) by mouth every 8 (eight) hours.   Multiple Vitamin tablet Take by mouth.   OMEGA-3 FATTY ACIDS PO Take by mouth.      Follow-up Information    Jules Husbands, MD On 08/03/2017.  Specialty:  General Surgery Why:  @10 :[place] ARRIVE AT 10AM Contact information: [place] [address] [phone]            Caroleen Hamman, MD FACS

## 2017-07-28 LAB — AEROBIC/ANAEROBIC CULTURE (SURGICAL/DEEP WOUND)

## 2017-07-28 LAB — AEROBIC/ANAEROBIC CULTURE W GRAM STAIN (SURGICAL/DEEP WOUND)

## 2017-08-03 ENCOUNTER — Ambulatory Visit (INDEPENDENT_AMBULATORY_CARE_PROVIDER_SITE_OTHER): Payer: Managed Care, Other (non HMO) | Admitting: Surgery

## 2017-08-03 ENCOUNTER — Encounter: Payer: Self-pay | Admitting: Surgery

## 2017-08-03 VITALS — BP 143/83 | HR 83 | Temp 97.8°F | Ht 67.0 in | Wt 202.2 lb

## 2017-08-03 DIAGNOSIS — K5732 Diverticulitis of large intestine without perforation or abscess without bleeding: Secondary | ICD-10-CM | POA: Diagnosis not present

## 2017-08-03 NOTE — Patient Instructions (Signed)
I have sent over a referral to [facility]. They will contact you with an appointment for your colonoscopy.   We will then follow up with you once those results are in and call to schedule you an appointment.  If you have any questions or concerns please give our office a call.  GENERAL POST-OPERATIVE PATIENT INSTRUCTIONS   WOUND CARE INSTRUCTIONS:  Keep a dry clean dressing on the wound if there is drainage. The initial bandage may be removed after 24 hours.  Once the wound has quit draining you may leave it open to air.  If clothing rubs against the wound or causes irritation and the wound is not draining you may cover it with a dry dressing during the daytime.  Try to keep the wound dry and avoid ointments on the wound unless directed to do so.  If the wound becomes bright red and painful or starts to drain infected material that is not clear, please contact your physician immediately.  If the wound is mildly pink and has a thick firm ridge underneath it, this is normal, and is referred to as a healing ridge.  This will resolve over the next 4-6 weeks.  BATHING: You may shower if you have been informed of this by your surgeon. However, Please do not submerge in a tub, hot tub, or pool until incisions are completely sealed or have been told by your surgeon that you may do so.  DIET:  You may eat any foods that you can tolerate.  It is a good idea to eat a high fiber diet and take in plenty of fluids to prevent constipation.  If you do become constipated you may want to take a mild laxative or take ducolax tablets on a daily basis until your bowel habits are regular.  Constipation can be very uncomfortable, along with straining, after recent surgery.  ACTIVITY:  You are encouraged to cough and deep breath or use your incentive spirometer if you were given one, every 15-30 minutes when awake.  This will help prevent respiratory complications and low grade fevers post-operatively if you had a general  anesthetic.  You may want to hug a pillow when coughing and sneezing to add additional support to the surgical area, if you had abdominal or chest surgery, which will decrease pain during these times.  You are encouraged to walk and engage in light activity for the next two weeks.  You should not lift more than 20 pounds, until 08/30/2017 as it could put you at increased risk for complications.  Twenty pounds is roughly equivalent to a plastic bag of groceries. At that time- Listen to your body when lifting, if you have pain when lifting, stop and then try again in a few days. Soreness after doing exercises or activities of daily living is normal as you get back in to your normal routine.  MEDICATIONS:  Try to take narcotic medications and anti-inflammatory medications, such as tylenol, ibuprofen, naprosyn, etc., with food.  This will minimize stomach upset from the medication.  Should you develop nausea and vomiting from the pain medication, or develop a rash, please discontinue the medication and contact your physician.  You should not drive, make important decisions, or operate machinery when taking narcotic pain medication.  SUNBLOCK Use sun block to incision area over the next year if this area will be exposed to sun. This helps decrease scarring and will allow you avoid a permanent darkened area over your incision.  QUESTIONS:  Please  feel free to call our office if you have any questions, and we will be glad to assist you. [phone]

## 2017-08-03 NOTE — Progress Notes (Signed)
Outpatient Surgical Follow Up  08/03/2017  Regina Patel is an 46 y.o. female.   Chief Complaint  Patient presents with  . New Patient (Initial Visit)    Hospital F/U: diverticulitis with abscess--discharged from ED on 07/26/17--pt on antibiotics    HPI: Complicated diverticulitis requiring drain placement. Minimal output from the drain. She is doing very well. Tolerating diet. No fevers no chills and abdominal pain. She still has 2 more days of antibiotics left  Past Medical History:  Diagnosis Date  . Hyperlipidemia   . Thyroid disease     Past Surgical History:  Procedure Laterality Date  . MOUTH SURGERY    . TOTAL ABDOMINAL HYSTERECTOMY  05/20/2014   ooperectomy unilateral    Family History  Problem Relation Age of Onset  . Breast cancer Mother 51    Social History:  reports that she has never smoked. She has never used smokeless tobacco. She reports that she does not drink alcohol or use drugs.  Allergies: No Known Allergies  Medications reviewed.    ROS Full ROS performed and is otherwise negative other than what is stated in HPI   BP (!) 143/83   Pulse 83   Temp 97.8 F (36.6 C) (Oral)   Ht 5\' 7"  (1.702 m)   Wt 91.7 kg (202 lb 3.2 oz)   BMI 31.67 kg/m    Physical Exam  Constitutional: She is oriented to person, place, and time and well-developed, well-nourished, and in no distress. No distress.  Eyes: Right eye exhibits no discharge. Left eye exhibits no discharge. No scleral icterus.  Neck: No JVD present.  Pulmonary/Chest: Effort normal. No stridor. She exhibits no tenderness.  Abdominal: Soft. She exhibits no distension. There is no tenderness. There is no rebound and no guarding.  Drain w minimal serous output. Removed.  Musculoskeletal: Normal range of motion. She exhibits no edema.  Neurological: She is oriented to person, place, and time. GCS score is 15.  Skin: Skin is warm and dry. She is not diaphoretic.  Psychiatric: Mood, memory, affect and  judgment normal.  Nursing note and vitals reviewed.     Assessment/Plan: Diverticulitis with abscess status post drain placement. Under very well to medical therapy. Drain removed today Schedule colonoscopy in 6 weeks RTC in 2 months for discussion of elective sigmoid colectomy  Caroleen Hamman, MD [facility]

## 2017-08-04 ENCOUNTER — Encounter: Payer: Self-pay | Admitting: Gastroenterology

## 2017-08-04 ENCOUNTER — Encounter: Payer: Managed Care, Other (non HMO) | Admitting: Obstetrics and Gynecology

## 2017-08-05 ENCOUNTER — Telehealth: Payer: Self-pay

## 2017-08-05 NOTE — Telephone Encounter (Signed)
Call  made to patient at this time. I left a message to let her know that [facility] has been trying to contact her per referral sent from [place]. I left [facility] number for her to call and schedule an appointment.

## 2017-08-10 ENCOUNTER — Emergency Department
Admission: EM | Admit: 2017-08-10 | Discharge: 2017-08-10 | Disposition: A | Discharge status: Home / Self Care | Payer: Managed Care, Other (non HMO) | Attending: Emergency Medicine | Admitting: Emergency Medicine

## 2017-08-10 ENCOUNTER — Telehealth: Payer: Self-pay

## 2017-08-10 ENCOUNTER — Encounter: Payer: Self-pay | Admitting: Emergency Medicine

## 2017-08-10 ENCOUNTER — Emergency Department: Payer: Managed Care, Other (non HMO)

## 2017-08-10 DIAGNOSIS — E079 Disorder of thyroid, unspecified: Secondary | ICD-10-CM | POA: Insufficient documentation

## 2017-08-10 DIAGNOSIS — R195 Other fecal abnormalities: Secondary | ICD-10-CM | POA: Insufficient documentation

## 2017-08-10 DIAGNOSIS — E039 Hypothyroidism, unspecified: Secondary | ICD-10-CM | POA: Insufficient documentation

## 2017-08-10 DIAGNOSIS — R109 Unspecified abdominal pain: Secondary | ICD-10-CM

## 2017-08-10 DIAGNOSIS — K921 Melena: Secondary | ICD-10-CM

## 2017-08-10 LAB — COMPREHENSIVE METABOLIC PANEL
ALK PHOS: 95 U/L (ref 38–126)
ALT: 18 U/L (ref 14–54)
ANION GAP: 8 (ref 5–15)
AST: 22 U/L (ref 15–41)
Albumin: 3.8 g/dL (ref 3.5–5.0)
BILIRUBIN TOTAL: 0.5 mg/dL (ref 0.3–1.2)
BUN: 8 mg/dL (ref 6–20)
CALCIUM: 9.2 mg/dL (ref 8.9–10.3)
CO2: 26 mmol/L (ref 22–32)
CREATININE: 0.68 mg/dL (ref 0.44–1.00)
Chloride: 103 mmol/L (ref 101–111)
Glucose, Bld: 96 mg/dL (ref 65–99)
Potassium: 4.3 mmol/L (ref 3.5–5.1)
SODIUM: 137 mmol/L (ref 135–145)
TOTAL PROTEIN: 7.6 g/dL (ref 6.5–8.1)

## 2017-08-10 LAB — CBC
HCT: 37.1 % (ref 35.0–47.0)
Hemoglobin: 12 g/dL (ref 12.0–16.0)
MCH: 25.8 pg — AB (ref 26.0–34.0)
MCHC: 32.3 g/dL (ref 32.0–36.0)
MCV: 79.8 fL — ABNORMAL LOW (ref 80.0–100.0)
PLATELETS: 298 10*3/uL (ref 150–440)
RBC: 4.65 MIL/uL (ref 3.80–5.20)
RDW: 17.1 % — ABNORMAL HIGH (ref 11.5–14.5)
WBC: 7.2 10*3/uL (ref 3.6–11.0)

## 2017-08-10 LAB — TYPE AND SCREEN
ABO/RH(D): A POS
ANTIBODY SCREEN: NEGATIVE

## 2017-08-10 MED ORDER — IOPAMIDOL (ISOVUE-300) INJECTION 61%
30.0000 mL | Freq: Once | INTRAVENOUS | Status: AC | PRN
  Administered 2017-08-10: 30 mL via ORAL

## 2017-08-10 MED ORDER — SODIUM CHLORIDE 0.9 % IV BOLUS (SEPSIS)
1000.0000 mL | Freq: Once | INTRAVENOUS | Status: AC
  Administered 2017-08-10: 1000 mL via INTRAVENOUS

## 2017-08-10 MED ORDER — IOPAMIDOL (ISOVUE-300) INJECTION 61%
100.0000 mL | Freq: Once | INTRAVENOUS | Status: AC | PRN
  Administered 2017-08-10: 100 mL via INTRAVENOUS

## 2017-08-10 NOTE — ED Provider Notes (Signed)
Care signed over from Dr. Mariea Clonts pending results of CT scan. Fortunately the CT is negative for acute diverticulitis or perforation. It does show stricture and the patient understands she needs to follow up with a colonoscopy to fully rule out malignancy. Discharged home in improved condition.   Regina Hong, MD 08/10/17 1626

## 2017-08-10 NOTE — Telephone Encounter (Addendum)
Patient calls in this morning stating that she has had increased abdominal pain and 5-6 bloody bowel movements yesterday. States that bleeding has continued this morning but patient expresses that she feels like it is better slightly. Still experiencing Abdominal Pain. Denies fever/chills. Denies nausea/vomiting. Seen in office last week by Dr. Dahlia Byes to follow-up on Diverticulitis and just finished course of Cipro/Flagyl on 08/05/17.   Spoke with Dr. Hampton Abbot this morning whom would like for patient to go to Emergency Room. Surgeon on call, Dr. Adonis Huguenin made aware that patient is on her way.  Called patient and she was told to report to ED immediately and would be evaluated there. She verbalizes understanding.

## 2017-08-10 NOTE — ED Notes (Signed)
Pt ambulatory to and from toilet by self.  

## 2017-08-10 NOTE — ED Provider Notes (Signed)
[facility] Emergency Department Provider Note  ____________________________________________  Time seen: Approximately 1:40 PM  I have reviewed the triage vital signs and the nursing notes.   HISTORY  Chief Complaint Blood In Stools    HPI Regina Patel is a 46 y.o. female with discharge from the hospital 07/26/17 after diverticulitis, located by abscess requiring interventional radiology drainage presenting with blood in the stool. The patient reports that 3 days ago she had an episode of dark red blood streaked with stool, and yesterday had 4 episodes of loose stool with blood in the toilet. Today she has not had any additional stooling. She did have some cramping yesterday, which has now resolved. No nausea or vomiting, fever or chills. No lightheadedness or shortness of breath. She spoke with her doctor who recommended ED evaluation.  Past Medical History:  Diagnosis Date  . Hyperlipidemia   . Thyroid disease     Patient Active Problem List   Diagnosis Date Noted  . Abscess of sigmoid colon   . Diverticulitis of large intestine with abscess 07/23/2017  . Obesity 04/22/2016  . 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 Hypothyroidism 10/02/2015  . Status post abdominal hysterectomy and right salpingo-oophorectomy 10/02/2015  . Abdominal bloating 05/03/2015  . 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 Multiple benign melanocytic nevi 05/03/2015  . Avitaminosis D 05/03/2015    Past Surgical History:  Procedure Laterality Date  . MOUTH SURGERY    . TOTAL ABDOMINAL HYSTERECTOMY  05/20/2014   ooperectomy unilateral    Current Outpatient Rx  . Order #: [id number] Class: Historical Med  . Order #: [id number] Class: Historical Med  . Order #: [id number] Class: Historical Med  . Reflex Order#: [id number] (Ord#:[id number])Class: Normal  . Order #: [id number] Class: Historical Med  . Order #: [id number] Class: Historical Med  .  Order #: [id number] Class: Historical Med  . Order #: [id number] Class: Normal  . Order #: [id number] Class: Print  . Order #: [id number] Class: Normal    Allergies Patient has no known allergies.  Family History  Problem Relation Age of Onset  . Breast cancer Mother 59    Social History Social History  Substance Use Topics  . Smoking status: Never Smoker  . Smokeless tobacco: Never Used  . Alcohol use No    Review of Systems Constitutional: No fever/chills.No lightheadedness or syncope. Eyes: No visual changes. ENT: No sore throat. No congestion or rhinorrhea. Cardiovascular: Denies chest pain. Denies palpitations. Respiratory: Denies shortness of breath.  No cough. Gastrointestinal: No nausea, no vomiting.   No constipation. Positive dark blood with loose stool. Positive middle lower abdominal cramping. Genitourinary: Negative for dysuria. Musculoskeletal: Negative for back pain. Skin: Negative for rash. Neurological: Negative for headaches. No focal numbness, tingling or weakness.     ____________________________________________   PHYSICAL EXAM:  VITAL SIGNS: ED Triage Vitals  Enc Vitals Group     BP 08/10/17 1116 (!) 154/96     Pulse Rate 08/10/17 1116 100     Resp 08/10/17 1116 18     Temp 08/10/17 1116 98.1 F (36.7 C)     Temp Source 08/10/17 1116 Oral     SpO2 08/10/17 1116 100 %     Weight 08/10/17 1117 202 lb (91.6 kg)     Height 08/10/17 1117 5\' 7"  (1.702 m)     Head Circumference --      Peak Flow --      Pain Score 08/10/17 1116 0     Pain Loc --  Pain Edu? --      Excl. in [place]? --     Constitutional: Alert and oriented. Well appearing and in no acute distress. Answers questions appropriately.Moves about the stretcher in the room comfortably. Eyes: Conjunctivae are normal.  EOMI. No scleral icterus. Head: Atraumatic. Nose: No congestion/rhinnorhea. Mouth/Throat: Mucous membranes are moist.  Neck: No stridor.  Supple.   Cardiovascular: Normal  rate, regular rhythm. No murmurs, rubs or gallops.  Respiratory: Normal respiratory effort.  No accessory muscle use or retractions. Lungs CTAB.  No wheezes, rales or ronchi. Gastrointestinal: Soft, and nondistended.  No tenderness to palpation on examination but the patient describes "pressure" when I press in the suprapubic region. She does have a well-healing drain scar 2 inches below the umbilicus that does not have any fluctuance, surrounding erythema, drainage or tenderness. No guarding or rebound.  No peritoneal signs. GU: Several nonthrombosed nonbleeding external hemorrhoids without any palpable internal hemorrhoids the patient has brown stool with some blood streak that is guaiac positive on examination. No pain with rectal examination  Musculoskeletal: No LE edema. No ttp in the calves or palpable cords.  Negative Homan's sign. Neurologic:  A&Ox3.  Speech is clear.  Face and smile are symmetric.  EOMI.  Moves all extremities well. Skin:  Skin is warm, dry and intact. No rash noted. Psychiatric: Mood and affect are normal. Speech and behavior are normal.  Normal judgement.  ____________________________________________   LABS (all labs ordered are listed, but only abnormal results are displayed)  Labs Reviewed  CBC - Abnormal; Notable for the following:       Result Value   MCV 79.8 (*)    MCH 25.8 (*)    RDW 17.1 (*)    All other components within normal limits  GASTROINTESTINAL PANEL BY PCR, STOOL (REPLACES STOOL CULTURE)  C DIFFICILE QUICK SCREEN W PCR REFLEX  COMPREHENSIVE METABOLIC PANEL  POC OCCULT BLOOD, ED  TYPE AND SCREEN   ____________________________________________  EKG  Not indicated ____________________________________________  RADIOLOGY  No results found.  ____________________________________________   PROCEDURES  Procedure(s) performed: None  Procedures  Critical Care performed: No ____________________________________________   INITIAL  IMPRESSION / ASSESSMENT AND PLAN / ED COURSE  Pertinent labs & imaging results that were available during my care of the patient were reviewed by me and considered in my medical decision making (see chart for details).  46 y.o. female with a recent hospitalization for diverticulitis complicated by abscess requiring interventional drainage presenting with 3 days of loose stool and blood mixed in the stool. Overall, the patient is hemodynamically stable and her blood counts are reassuring with a normal hematocrit and hemoglobin. She is afebrile. Her abdominal examination does not show any significant discomfort. However, given her recent couple occasions, get a CT scan for further evaluation. Plan reevaluation for final disposition.  ----------------------------------------- 3:22 PM on 08/10/2017 -----------------------------------------  The patient has a stable hemoglobin and hematocrit and has remained hemodynamically stable in the emergency department. Her white blood cell count is also normal at 7.2. At this time, she is drinking contrast for CT of the abdomen. I have signed the patient out to the oncoming physician, who will follow up her imaging results and reevaluate the patient for final disposition.  ____________________________________________  FINAL CLINICAL IMPRESSION(S) / ED DIAGNOSES  Final diagnoses:  Blood in stool  Abdominal cramping         NEW MEDICATIONS STARTED DURING THIS VISIT:  New Prescriptions   No medications on file  Eula Listen, MD 08/10/17 [phone]

## 2017-08-10 NOTE — Discharge Instructions (Signed)
Fortunately today your blood work and your CT scan were reassuring. It still is critically important that you have a colonoscopy in the near future to evaluate the resolution of your symptoms. Return to the emergency department for any concerns.  It was a pleasure to take care of you today, and thank you for coming to our emergency department.  If you have any questions or concerns before leaving please ask the nurse to grab me and I'm more than happy to go through your aftercare instructions again.  If you were prescribed any opioid pain medication today such as Norco, Vicodin, Percocet, morphine, hydrocodone, or oxycodone please make sure you do not drive when you are taking this medication as it can alter your ability to drive safely.  If you have any concerns once you are home that you are not improving or are in fact getting worse before you can make it to your follow-up appointment, please do not hesitate to call 911 and come back for further evaluation.  Darel Hong, MD  Results for orders placed or performed during the hospital encounter of 08/10/17  Comprehensive metabolic panel  Result Value Ref Range   Sodium 137 135 - 145 mmol/L   Potassium 4.3 3.5 - 5.1 mmol/L   Chloride 103 101 - 111 mmol/L   CO2 26 22 - 32 mmol/L   Glucose, Bld 96 65 - 99 mg/dL   BUN 8 6 - 20 mg/dL   Creatinine, Ser 0.68 0.44 - 1.00 mg/dL   Calcium 9.2 8.9 - 10.3 mg/dL   Total Protein 7.6 6.5 - 8.1 g/dL   Albumin 3.8 3.5 - 5.0 g/dL   AST 22 15 - 41 U/L   ALT 18 14 - 54 U/L   Alkaline Phosphatase 95 38 - 126 U/L   Total Bilirubin 0.5 0.3 - 1.2 mg/dL   GFR calc non Af Amer >60 >60 mL/min   GFR calc Af Amer >60 >60 mL/min   Anion gap 8 5 - 15  CBC  Result Value Ref Range   WBC 7.2 3.6 - 11.0 K/uL   RBC 4.65 3.80 - 5.20 MIL/uL   Hemoglobin 12.0 12.0 - 16.0 g/dL   HCT 37.1 35.0 - 47.0 %   MCV 79.8 (L) 80.0 - 100.0 fL   MCH 25.8 (L) 26.0 - 34.0 pg   MCHC 32.3 32.0 - 36.0 g/dL   RDW 17.1 (H) 11.5 -  14.5 %   Platelets 298 150 - 440 K/uL  Type and screen [facility]  Result Value Ref Range   ABO/RH(D) A POS    Antibody Screen NEG    Sample Expiration 08/13/2017    Ct Abdomen Pelvis W Contrast  Result Date: 08/10/2017 CLINICAL DATA:  Diarrhea, blood in toilet. Recent diverticular abscess drainage. History of appendectomy and hysterectomy. EXAM: CT ABDOMEN AND PELVIS WITH CONTRAST TECHNIQUE: Multidetector CT imaging of the abdomen and pelvis was performed using the standard protocol following bolus administration of intravenous contrast. CONTRAST:  126mL ISOVUE-300 IOPAMIDOL (ISOVUE-300) INJECTION 61% COMPARISON:  CT abdomen and pelvis July 23, 2017 and CT abdomen and pelvis November 06, 2014 FINDINGS: LOWER CHEST: Lung bases are clear. Included heart size is normal. No pericardial effusion. HEPATOBILIARY: Liver is normal. Mild distended gallbladder, otherwise unremarkable. PANCREAS: Normal. SPLEEN: Normal. ADRENALS/URINARY TRACT: Kidneys are orthotopic, demonstrating symmetric enhancement. No nephrolithiasis, hydronephrosis or solid renal masses. Too small to characterize hypodensity lower pole bilateral kidneys. RIGHT pelviectasis without hydronephrosis. Delayed imaging through the kidneys demonstrates symmetric  prompt contrast excretion within the proximal urinary collecting system. Urinary bladder is partially distended and unremarkable. Normal adrenal glands. STOMACH/BOWEL: 2.7 cm circumferential sigmoid bowel wall thickening at site of prior perforation with mild fat stranding. No residual abscess. Moderate retained large bowel stool. Small hiatal hernia. The stomach, small bowel are normal in course and caliber without inflammatory changes. VASCULAR/LYMPHATIC: Aortoiliac vessels are normal in course and caliber. No lymphadenopathy by CT size criteria. Subcentimeter LEFT pelvic lymph node, nonspecific. REPRODUCTIVE: Surgically absent. OTHER: No intraperitoneal free fluid or  free air. MUSCULOSKELETAL: Nonacute. Mild rectus abdominis diastases with small fat containing umbilical hernia. Osteopenia. 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 Scattered Schmorl's nodes. IMPRESSION: 1. Short segment circumferential sigmoid bowel wall thickening could represent stricture given recent inflammation versus, colonic neoplasm. Recommend colonoscopy. 2. Mild residual inflammatory changes in the pelvis.  No abscess. 3. RIGHT pelviectasis, no hydronephrosis. Electronically Signed   By: Elon Alas M.D.   On: 08/10/2017 15:59   Ct Abdomen Pelvis W Contrast  Result Date: 07/23/2017 CLINICAL DATA:  46 year old female with progressive abdominal and pelvic pain for the last 4 days. EXAM: CT ABDOMEN AND PELVIS WITH CONTRAST TECHNIQUE: Multidetector CT imaging of the abdomen and pelvis was performed using the standard protocol following bolus administration of intravenous contrast. CONTRAST:  180mL ISOVUE-300 IOPAMIDOL (ISOVUE-300) INJECTION 61% COMPARISON:  Prior CT scan of the abdomen and pelvis 11/06/2014 FINDINGS: Lower chest: The lung bases are clear. Visualized cardiac structures are within normal limits for size. No pericardial effusion. Unremarkable visualized distal thoracic esophagus. Hepatobiliary: Normal hepatic contour and morphology. No discrete hepatic lesions. Normal appearance of the gallbladder. No intra or extrahepatic biliary ductal dilatation. Pancreas: Unremarkable. No pancreatic ductal dilatation or surrounding inflammatory changes. Spleen: Normal in size without focal abnormality. Adrenals/Urinary Tract: Adrenal glands are unremarkable. Kidneys are normal, without renal calculi, focal lesion, or hydronephrosis. Perhaps minimal fullness of the right renal collecting system. Bladder is unremarkable. Stomach/Bowel: 4.7 cm segment of circumferential irregular wall thickening involving the sigmoid colon. This is in the same region as the eccentric wall thickening  which was noted on 11/06/2014. There is evidence of focal perforation with a large 6.1 x 4.4 cm fluid and gas collection within the central distal small bowel mesenteric concerning for abscess. Secondary inflammatory change results in wall narrowing of an overlying loop of small bowel resulting in diffuse fluid-filled dilatation of the proximal loops consistent with a local reactive ileus. Extensive inflammatory stranding in the sigmoid mesocolon and small bowel mesenteric. Small mesocolon lymph node measures 6 mm in short axis. Vascular/Lymphatic: No significant atherosclerotic plaque or vascular finding. Sigmoid mesocolon lymph node is rounded and measures 6 mm in short axis. Reproductive: Surgical changes of prior hysterectomy. The right ovary is surgically absent. Left ovarian cysts measuring up to 3.1 cm. Other: No abdominal wall hernia or abnormality. No abdominopelvic ascites. Musculoskeletal: No acute fracture or aggressive appearing lytic or blastic osseous lesion. IMPRESSION: 1. Positive for colonic perforation with adjacent abscess in the distal ileal mesentery resulting in significant reactive ileus of proximal loops of small bowel as well as some a mild compression of the adjacent right ureter resulting in mild fullness of the right renal collecting system but no definite hydronephrosis. The abnormal segment of colon demonstrates circumferential irregular wall thickening which has significantly progressed compared to the findings seen on 10/2014. Overall findings are highly concerning for a perforated sigmoid colonic neoplasm versus perforated segment of chronic ischemic colitis (perhaps secondary to the reported prior abdominal surgery?).  Recommend surgical consultation. 2. Sigmoid mesocolon lymph node is rounded and measures 6 mm. This could be reactive, or metastatic. 3. Surgical changes of prior hysterectomy, right oophorectomy and appendectomy. Electronically Signed   By: Jacqulynn Cadet M.D.    On: 07/23/2017 09:37   Ct Image Guided Drainage By Percutaneous Catheter  Result Date: 07/23/2017 INDICATION: 46 year old with an abscess in lower abdomen/pelvis. Source is likely from the sigmoid colon. Patient needs percutaneous drainage. EXAM: CT GUIDED DRAINAGE OF A PELVIC ABSCESS MEDICATIONS: The patient is currently admitted to the hospital and receiving intravenous antibiotics. ANESTHESIA/SEDATION: 3 mg IV Versed 100 mcg IV Fentanyl Moderate Sedation Time:  17 minutes The patient was continuously monitored during the procedure by the interventional radiology nurse under my direct supervision. COMPLICATIONS: None immediate. TECHNIQUE: Informed written consent was obtained from the patient after a thorough discussion of the procedural risks, benefits and alternatives. All questions were addressed. Maximal Sterile Barrier Technique was utilized including caps, mask, sterile gowns, sterile gloves, sterile drape, hand hygiene and skin antiseptic. A timeout was performed prior to the initiation of the procedure. PROCEDURE: Patient was placed supine on the CT scanner. Images through the lower abdomen and pelvis were obtained. The abscess was identified. The anterior pelvic region was prepped with chlorhexidine and sterile field was created. Skin and soft tissue were anesthetized with 1% lidocaine. 59 gauge trocar needle was directed into the abscess collection with CT guidance. Thick brown purulent fluid was aspirated. A stiff Amplatz wire was placed. The tract was dilated to accommodate a 10.2 [place] multipurpose drain. Brown, thick purulent, foul-smelling fluid was removed. The catheter was flushed with saline and attached to a suction bulb. Sample sent for Gram stain and culture. FINDINGS: Irregular air-fluid collection in the lower abdomen and pelvis. Findings compatible with an abscess. Brown purulent fluid was removed. IMPRESSION: Successful CT-guided drain placement within the pelvic abscess.  Electronically Signed   By: Markus Daft M.D.   On: 07/23/2017 15:30

## 2017-08-10 NOTE — ED Triage Notes (Signed)
Pt reports has recently been treated for abscess in colon. Pt states was discharged 07/26/17. Pt reports yesterday noted black watery stool. Pt reports when she wipes it looks like dark clots. Denies pain. Denies vomiting. Pt color WNL.

## 2017-08-10 NOTE — ED Notes (Signed)
Patient verbalizes understanding of d/c instructions and follow-up. VS stable and pain controlled per patient.  Patient in NAD at time of d/c and denies further concerns regarding this visit. Patient stable at the time of departure from the unit, departing unit by the safest and most appropriate manner per that patients condition and limitations. Patient advised to return to the ED at any time for emergent concerns, or for new/worsening symptoms.   pt refused wheel chair out

## 2017-08-10 NOTE — ED Notes (Signed)
Pt ambulatory to toilet and back.

## 2017-08-10 NOTE — ED Notes (Signed)
Pt taken to CT  Pt had defecated in toilet as well as urinated. Brown formed stool, some blood noted in toilet.

## 2017-08-15 ENCOUNTER — Telehealth: Payer: Self-pay | Admitting: Gastroenterology

## 2017-08-15 NOTE — Telephone Encounter (Signed)
Patient LVM and is ready to schedule her procedure.

## 2017-08-31 ENCOUNTER — Ambulatory Visit (INDEPENDENT_AMBULATORY_CARE_PROVIDER_SITE_OTHER): Payer: Managed Care, Other (non HMO) | Admitting: Gastroenterology

## 2017-08-31 ENCOUNTER — Encounter: Payer: Self-pay | Admitting: Gastroenterology

## 2017-08-31 ENCOUNTER — Encounter (INDEPENDENT_AMBULATORY_CARE_PROVIDER_SITE_OTHER): Payer: Self-pay

## 2017-08-31 VITALS — BP 150/79 | HR 105 | Temp 98.1°F | Ht 67.0 in | Wt 201.0 lb

## 2017-08-31 DIAGNOSIS — R1084 Generalized abdominal pain: Secondary | ICD-10-CM | POA: Diagnosis not present

## 2017-08-31 DIAGNOSIS — K5720 Diverticulitis of large intestine with perforation and abscess without bleeding: Secondary | ICD-10-CM

## 2017-08-31 NOTE — Addendum Note (Signed)
Addended by: Peggye Ley on: 08/31/2017 02:38 PM   Modules accepted: Orders

## 2017-08-31 NOTE — Progress Notes (Signed)
Jonathon Bellows MD, MRCP(U.K) [address]  [place]  [place], [place] [address]  Main: [phone]  Fax: [phone]   Gastroenterology Consultation  Referring Provider:     Jules Husbands, MD Primary Care Physician:  Mikey College, NP Primary Gastroenterologist:  Dr. Jonathon Bellows  Reason for Consultation:     Diverticulitis        HPI:   Regina Patel is a 46 y.o. y/o female referred for consultation & management  by Dr. Merrilyn Puma, Jerrel Ivory, NP.   She has been referred for diverticulitis. She was see discharged from the hospital with diverticulitis with an abscess that needed drainage by IR 07/23/17 - The Ct scan showed possible perforation in the sigmoid colon from a neoplasm vs ischemic colitis.  Marland Kitchen She was subsequently seen in the ER on 08/10/17 for some blood in her stool a repeat scan showed a short segment of circumferential sigmoid wall thickening which could repesent a stricture , mild inflammation was seen in the pelvis.  She follows with Dr Dahlia Byes . Last office visit was for a colonoscopy followed by elective sigmoid colectomy . Hb 12.0 with MCV of 79 . CMP-normal.   Since her ER visit she says that she was doing well till her ER visit, 2 days after she woke up , had some abdominal cramps , diarrhea. Had some vomiting , subsequently got better, felt "normal for a few days".  Presently she says that had some chills on and off since ER visit, abdominal cramping . Feels feverish but when she checks has no fever. Feels like its similar to symptoms prior to her first ER visit.   Presently no diarrhea , has soft stools. Shape of her stool has been like ribbons. Never had a colonoscopy . No family history of colon cancer. She does have some rectal bleeding when she has the abdominal cramping, last episode was 3 days back.     Past Medical History:  Diagnosis Date  . Hyperlipidemia   . Thyroid disease     Past Surgical History:  Procedure Laterality Date  . MOUTH  SURGERY    . TOTAL ABDOMINAL HYSTERECTOMY  05/20/2014   ooperectomy unilateral    Prior to Admission medications   Medication Sig Start Date End Date Taking? Authorizing Provider  APPLE CIDER VINEGAR PO Take 1 tablet by mouth daily.    [provider]  Biotin 10 MG TABS Take by mouth.    [provider]  ciprofloxacin (CIPRO) 500 MG tablet Take 1 tablet (500 mg total) by mouth 2 (two) times daily. Patient not taking: Reported on 08/10/2017 07/26/17   Jules Husbands, MD  HYDROcodone-acetaminophen (NORCO/VICODIN) 5-325 MG tablet Take 1-2 tablets by mouth every 4 (four) hours as needed for moderate pain. Patient not taking: Reported on 08/03/2017 07/26/17   Caroleen Hamman F, MD  IRON, IRON, Take by mouth.    [provider]  levothyroxine (SYNTHROID, LEVOTHROID) 175 MCG tablet Take 1 tablet (175 mcg total) by mouth daily before breakfast. 05/03/17   Mikey College, NP  metroNIDAZOLE (FLAGYL) 500 MG tablet Take 1 tablet (500 mg total) by mouth every 8 (eight) hours. Patient not taking: Reported on 08/10/2017 07/26/17   Jules Husbands, MD  Multiple Vitamin tablet Take by mouth.    [provider]  OMEGA-3 FATTY ACIDS PO Take by mouth.    [provider]  VITAMIN D, CHOLECALCIFEROL, PO Take 1 tablet by mouth daily.    [provider]    Family History  Problem Relation Age of Onset  . Breast cancer Mother 23     Social History  Substance Use Topics  . Smoking status: Never Smoker  . Smokeless tobacco: Never Used  . Alcohol use No    Allergies as of 08/31/2017  . (No Known Allergies)    Review of Systems:    All systems reviewed and negative except where noted in HPI.   Physical Exam:  There were no vitals taken for this visit. No LMP recorded. Patient has had a hysterectomy. Psych:  Alert and cooperative. Normal mood and affect. General:   Alert,  Well-developed, well-nourished, pleasant and cooperative in NAD Head:   Normocephalic and atraumatic. Eyes:  Sclera clear, no icterus.   Conjunctiva pink. Ears:  Normal auditory acuity. Nose:  No deformity, discharge, or lesions. Mouth:  No deformity or lesions,oropharynx pink & moist. Neck:  Supple; no masses or thyromegaly. Lungs:  Respirations even and unlabored.  Clear throughout to auscultation.   No wheezes, crackles, or rhonchi. No acute distress. Heart:  Regular rate and rhythm; no murmurs, clicks, rubs, or gallops. Abdomen:  Normal bowel sounds.  No bruits.  Soft, non-tender and non-distended without masses, hepatosplenomegaly or hernias noted.  No guarding or rebound tenderness.    Msk:  Symmetrical without gross deformities. Good, equal movement & strength bilaterally. Pulses:  Normal pulses noted. Extremities:  No clubbing or edema.  No cyanosis. Neurologic:  Alert and oriented x3;  grossly normal neurologically. Skin:  Intact without significant lesions or rashes. No jaundice. Lymph Nodes:  No significant cervical adenopathy. Psych:  Alert and cooperative. Normal mood and affect.  Imaging Studies: Ct Abdomen Pelvis W Contrast  Result Date: 08/10/2017 CLINICAL DATA:  Diarrhea, blood in toilet. Recent diverticular abscess drainage. History of appendectomy and hysterectomy. EXAM: CT ABDOMEN AND PELVIS WITH CONTRAST TECHNIQUE: Multidetector CT imaging of the abdomen and pelvis was performed using the standard protocol following bolus administration of intravenous contrast. CONTRAST:  111mL ISOVUE-300 IOPAMIDOL (ISOVUE-300) INJECTION 61% COMPARISON:  CT abdomen and pelvis July 23, 2017 and CT abdomen and pelvis November 06, 2014 FINDINGS: LOWER CHEST: Lung bases are clear. Included heart size is normal. No pericardial effusion. HEPATOBILIARY: Liver is normal. Mild distended gallbladder, otherwise unremarkable. PANCREAS: Normal. SPLEEN: Normal. ADRENALS/URINARY TRACT: Kidneys are orthotopic, demonstrating symmetric enhancement. No nephrolithiasis,  hydronephrosis or solid renal masses. Too small to characterize hypodensity lower pole bilateral kidneys. RIGHT pelviectasis without hydronephrosis. Delayed imaging through the kidneys demonstrates symmetric prompt contrast excretion within the proximal urinary collecting system. Urinary bladder is partially distended and unremarkable. Normal adrenal glands. STOMACH/BOWEL: 2.7 cm circumferential sigmoid bowel wall thickening at site of prior perforation with mild fat stranding. No residual abscess. Moderate retained large bowel stool. Small hiatal hernia. The stomach, small bowel are normal in course and caliber without inflammatory changes. VASCULAR/LYMPHATIC: Aortoiliac vessels are normal in course and caliber. No lymphadenopathy by CT size criteria. Subcentimeter LEFT pelvic lymph node, nonspecific. REPRODUCTIVE: Surgically absent. OTHER: No intraperitoneal free fluid or free air. MUSCULOSKELETAL: Nonacute. Mild rectus abdominis diastases with small fat containing umbilical hernia. Osteopenia. 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 Scattered Schmorl's nodes. IMPRESSION: 1. Short segment circumferential sigmoid bowel wall thickening could represent stricture given recent inflammation versus, colonic neoplasm. Recommend colonoscopy. 2. Mild residual inflammatory changes in the pelvis.  No abscess. 3. RIGHT pelviectasis, no hydronephrosis. Electronically Signed   By: Elon Alas M.D.   On: 08/10/2017 15:59    Assessment  and Plan:   Regina Patel is a 46 y.o. y/o female recently admitted with a perforated sigmoid colonic diverticulitis, s/p IR drain, CT scan concerning for sigmoid stricture vs neoplasm. Since her ER visit she has had gradual worsening of her symptoms with abdominal cramping , possible fevers and says she feels like her first presentation of diverticulitis.   Plan  1. Diagnostic colonoscopy after CT scan of the abdomen to r/o abscess .    I have discussed alternative  options, risks & benefits,  which include, but are not limited to, bleeding, infection, perforation,respiratory complication & drug reaction.  The patient agrees with this plan & written consent will be obtained.    Follow up in 8-10 weeks   Dr Jonathon Bellows MD,MRCP(U.K)

## 2017-09-02 ENCOUNTER — Other Ambulatory Visit: Payer: Self-pay

## 2017-09-02 ENCOUNTER — Ambulatory Visit
Admission: RE | Admit: 2017-09-02 | Discharge: 2017-09-02 | Disposition: A | Discharge status: Home / Self Care | Payer: Managed Care, Other (non HMO) | Source: Ambulatory Visit | Attending: Gastroenterology | Admitting: Gastroenterology

## 2017-09-02 DIAGNOSIS — R1084 Generalized abdominal pain: Secondary | ICD-10-CM

## 2017-09-02 DIAGNOSIS — K6389 Other specified diseases of intestine: Secondary | ICD-10-CM | POA: Diagnosis not present

## 2017-09-02 DIAGNOSIS — K5732 Diverticulitis of large intestine without perforation or abscess without bleeding: Secondary | ICD-10-CM

## 2017-09-02 DIAGNOSIS — K5720 Diverticulitis of large intestine with perforation and abscess without bleeding: Secondary | ICD-10-CM

## 2017-09-02 MED ORDER — METRONIDAZOLE 500 MG PO TABS: 500.0000 mg | ORAL_TABLET | Freq: Three times a day (TID) | ORAL | 0 refills | Status: DC

## 2017-09-02 MED ORDER — IOPAMIDOL (ISOVUE-300) INJECTION 61%
100.0000 mL | Freq: Once | INTRAVENOUS | Status: AC | PRN
  Administered 2017-09-02: 100 mL via INTRAVENOUS

## 2017-09-02 MED ORDER — CIPROFLOXACIN HCL 500 MG PO TABS: 500.0000 mg | ORAL_TABLET | Freq: Two times a day (BID) | ORAL | 0 refills | Status: DC

## 2017-09-07 ENCOUNTER — Ambulatory Visit: Payer: Managed Care, Other (non HMO)

## 2017-09-16 ENCOUNTER — Other Ambulatory Visit
Admission: RE | Admit: 2017-09-16 | Discharge: 2017-09-16 | Disposition: A | Discharge status: Home / Self Care | Payer: Managed Care, Other (non HMO) | Source: Ambulatory Visit | Attending: Gastroenterology | Admitting: Gastroenterology

## 2017-09-16 ENCOUNTER — Ambulatory Visit: Payer: Managed Care, Other (non HMO) | Admitting: Gastroenterology

## 2017-09-16 ENCOUNTER — Encounter (INDEPENDENT_AMBULATORY_CARE_PROVIDER_SITE_OTHER): Payer: Self-pay

## 2017-09-16 ENCOUNTER — Ambulatory Visit (INDEPENDENT_AMBULATORY_CARE_PROVIDER_SITE_OTHER): Payer: Managed Care, Other (non HMO) | Admitting: Gastroenterology

## 2017-09-16 ENCOUNTER — Encounter: Payer: Self-pay | Admitting: Gastroenterology

## 2017-09-16 VITALS — BP 131/82 | HR 76 | Temp 97.9°F | Ht 67.0 in | Wt 203.0 lb

## 2017-09-16 DIAGNOSIS — K5720 Diverticulitis of large intestine with perforation and abscess without bleeding: Secondary | ICD-10-CM

## 2017-09-16 LAB — CBC WITH DIFFERENTIAL/PLATELET
Basophils Absolute: 0 10*3/uL (ref 0–0.1)
Basophils Relative: 0 %
EOS ABS: 0.1 10*3/uL (ref 0–0.7)
Eosinophils Relative: 1 %
HCT: 32.9 % — ABNORMAL LOW (ref 35.0–47.0)
HEMOGLOBIN: 10.9 g/dL — AB (ref 12.0–16.0)
LYMPHS ABS: 2.1 10*3/uL (ref 1.0–3.6)
Lymphocytes Relative: 33 %
MCH: 26.1 pg (ref 26.0–34.0)
MCHC: 33.2 g/dL (ref 32.0–36.0)
MCV: 78.6 fL — ABNORMAL LOW (ref 80.0–100.0)
MONO ABS: 0.4 10*3/uL (ref 0.2–0.9)
MONOS PCT: 7 %
NEUTROS PCT: 59 %
Neutro Abs: 3.8 10*3/uL (ref 1.4–6.5)
Platelets: 245 10*3/uL (ref 150–440)
RBC: 4.19 MIL/uL (ref 3.80–5.20)
RDW: 17.9 % — AB (ref 11.5–14.5)
WBC: 6.5 10*3/uL (ref 3.6–11.0)

## 2017-09-16 NOTE — Progress Notes (Signed)
Jonathon Bellows MD, MRCP(U.K) [address]  [place]  [place], [place] [address]  Main: [phone]  Fax: [phone]   Primary Care Physician: Mikey College, NP  Primary Gastroenterologist:  Dr. Jonathon Bellows   Chief Complaint  Patient presents with  . Follow-up    HPI: Regina Patel is a 46 y.o. female    She is here today to follow up for diverticulitis.    Summary of history : She was initially referred and seen on 08/31/17 for diverticulitis. She was see discharged from the hospital with diverticulitis with an abscess that needed drainage by IR 07/23/17 - The Ct scan showed possible perforation in the sigmoid colon from a neoplasm vs ischemic colitis.  Marland Kitchen She was subsequently seen in the ER on 08/10/17 for some blood in her stool a repeat scan showed a short segment of circumferential sigmoid wall thickening which could repesent a stricture , mild inflammation was seen in the pelvis.  She follows with Dr Dahlia Byes    Interval history   08/31/2017-  09/16/2017    09/02/17- CT scan of the abdomen showed evidence of recurrent diverticulitis of the sigmoid colon with no focal abscess   She has completed 2 weeks of antibiotics today , symptoms are much better but not resolved, on and off blood with stools, no fever.     Current Outpatient Prescriptions  Medication Sig Dispense Refill  . APPLE CIDER VINEGAR PO Take 1 tablet by mouth daily.    . Biotin 10 MG TABS Take by mouth.    . levothyroxine (SYNTHROID, LEVOTHROID) 175 MCG tablet Take 1 tablet (175 mcg total) by mouth daily before breakfast. 30 tablet 11  . OMEGA-3 FATTY ACIDS PO Take by mouth.    Marland Kitchen VITAMIN D, CHOLECALCIFEROL, PO Take 1 tablet by mouth daily.    . IRON, IRON, Take by mouth.    . Multiple Vitamin tablet Take by mouth.     No current facility-administered medications for this visit.     Allergies as of 09/16/2017  . (No Known Allergies)    ROS:  General: Negative for anorexia, weight loss, fever,  chills, fatigue, weakness. ENT: Negative for hoarseness, difficulty swallowing , nasal congestion. CV: Negative for chest pain, angina, palpitations, dyspnea on exertion, peripheral edema.  Respiratory: Negative for dyspnea at rest, dyspnea on exertion, cough, sputum, wheezing.  GI: See history of present illness. GU:  Negative for dysuria, hematuria, urinary incontinence, urinary frequency, nocturnal urination.  Endo: Negative for unusual weight change.    Physical Examination:   BP 131/82 (BP Location: Left Arm, Patient Position: Sitting, Cuff Size: Large)   Pulse 76   Temp 97.9 F (36.6 C) (Oral)   Ht 5\' 7"  (1.702 m)   Wt 203 lb (92.1 kg)   BMI 31.79 kg/m   General: Well-nourished, well-developed in no acute distress.  Eyes: No icterus. Conjunctivae pink. Mouth: Oropharyngeal mucosa moist and pink , no lesions erythema or exudate. Lungs: Clear to auscultation bilaterally. Non-labored. Heart: Regular rate and rhythm, no murmurs rubs or gallops.  Abdomen: Bowel sounds are normal, nontender, nondistended, no hepatosplenomegaly or masses, no abdominal bruits or hernia , no rebound or guarding.   Extremities: No lower extremity edema. No clubbing or deformities. Neuro: Alert and oriented x 3.  Grossly intact. Skin: Warm and dry, no jaundice.   Psych: Alert and cooperative, normal mood and affect.   Imaging Studies: Ct Abdomen Pelvis W Contrast  Result Date: 09/02/2017 CLINICAL DATA:  Lower abdominal  pain, fever and blood in stool. History of diverticulitis of the sigmoid colon with perforation and abscess. Status post prior percutaneous drainage of diverticular abscess on 07/23/2017. EXAM: CT ABDOMEN AND PELVIS WITH CONTRAST TECHNIQUE: Multidetector CT imaging of the abdomen and pelvis was performed using the standard protocol following bolus administration of intravenous contrast. CONTRAST:  181mL ISOVUE-300 IOPAMIDOL (ISOVUE-300) INJECTION 61% COMPARISON:  08/10/2017, 07/23/2017  FINDINGS: Lower chest: No acute abnormality. Hepatobiliary: No focal liver abnormality is seen. No gallstones, gallbladder wall thickening, or biliary dilatation. Pancreas: Unremarkable. No pancreatic ductal dilatation or surrounding inflammatory changes. Spleen: Normal in size without focal abnormality. Adrenals/Urinary Tract: Adrenal glands are unremarkable. Kidneys are normal, without renal calculi, focal lesion, or hydronephrosis. Bladder is unremarkable. Stomach/Bowel: There remains residual thickening and inflammation of the sigmoid colon with extension of inflammatory changes into the adjacent fat. Findings are suggestive of diverticulitis. No focal abscess is identified. No significant free fluid. No free air. Vascular/Lymphatic: No significant vascular findings are present. No enlarged abdominal or pelvic lymph nodes. Reproductive: Status post hysterectomy. No adnexal masses. Other: No hernias identified. Musculoskeletal: No acute or significant osseous findings. IMPRESSION: Residual thickening and inflammation of the sigmoid colon is suggestive of recurrent diverticulitis. No focal abscess is identified. Electronically Signed   By: Aletta Edouard M.D.   On: 09/02/2017 14:02    Assessment and Plan:   Regina Patel is a 46 y.o. y/o female  recently admitted with a perforated sigmoid colonic diverticulitis, s/p IR drain, CT scan concerning for sigmoid stricture vs neoplasm. She was seen by me at the office 2 weeks back with recurrence of symptoms and a repeat CT scan of the abdomen showed recurrent diverticulitis . A second course of antibiotics was given . She is feeling better but still has some cramping and lower abdominal discomfort. I will wait and see how she does over the next 4 weeks, we will call her back in 4 weeks to check how she is doing , if symptoms have resolved then will proceed with colonoscopy, if no better will obtain repeat CT scan followed by possible discussion with surgery if  diverticultis persists for surgery . Will obtain a CBC today due to on and off rectal bleeding    Dr Jonathon Bellows  MD,MRCP [facility]) Follow up in 6 weeks

## 2017-09-20 ENCOUNTER — Telehealth: Payer: Self-pay

## 2017-09-20 NOTE — Telephone Encounter (Signed)
LVM for patient callback for results per Dr. Anna.  

## 2017-09-20 NOTE — Telephone Encounter (Signed)
-----   Message from Jonathon Bellows, MD sent at 09/18/2017  1:07 PM EDT ----- Inform Hb has dropped a bit- microcytic - suggest check iron,TIBC,Ferritin, b12,folate

## 2017-09-27 ENCOUNTER — Other Ambulatory Visit: Payer: Managed Care, Other (non HMO)

## 2017-10-05 ENCOUNTER — Telehealth: Payer: Self-pay | Admitting: Gastroenterology

## 2017-10-05 NOTE — Telephone Encounter (Signed)
Patient left a voice message that Dr. Vicente Males wants her to keep him posted on how she is feeling. She is starting to experience painful coughing again.  Please call

## 2017-10-06 NOTE — Telephone Encounter (Signed)
Ok.... Ask her to keep a close watch ..If pain worsens to call us , otherwise lets touch base in 2 weeks to see how she is doing to discuss scheduling colonoscopy . Can try bentyl PRN in the interim. If has fever must call us asap

## 2017-10-07 ENCOUNTER — Encounter: Payer: Managed Care, Other (non HMO) | Admitting: Nurse Practitioner

## 2017-10-07 ENCOUNTER — Telehealth: Payer: Self-pay

## 2017-10-07 MED ORDER — DICYCLOMINE HCL 10 MG PO CAPS: 10.0000 mg | ORAL_CAPSULE | Freq: Three times a day (TID) | ORAL | 0 refills | Status: DC

## 2017-10-07 NOTE — Telephone Encounter (Signed)
Advised patient of the following per Dr. Vicente Males.   ...keep a close watch ..If pain worsens to call us , otherwise lets touch base in 2 weeks to see how she is doing to discuss scheduling colonoscopy . Can try bentyl PRN in the interim. If has fever must call us asap.  Rx sent to pharmacy.

## 2017-10-19 ENCOUNTER — Other Ambulatory Visit
Admission: RE | Admit: 2017-10-19 | Discharge: 2017-10-19 | Disposition: A | Discharge status: Home / Self Care | Payer: Managed Care, Other (non HMO) | Source: Ambulatory Visit | Attending: Gastroenterology | Admitting: Gastroenterology

## 2017-10-19 ENCOUNTER — Ambulatory Visit (INDEPENDENT_AMBULATORY_CARE_PROVIDER_SITE_OTHER): Payer: Managed Care, Other (non HMO) | Admitting: Gastroenterology

## 2017-10-19 ENCOUNTER — Encounter: Payer: Self-pay | Admitting: Gastroenterology

## 2017-10-19 ENCOUNTER — Telehealth: Payer: Self-pay

## 2017-10-19 ENCOUNTER — Encounter (INDEPENDENT_AMBULATORY_CARE_PROVIDER_SITE_OTHER): Payer: Self-pay

## 2017-10-19 VITALS — BP 143/64 | HR 93 | Temp 98.0°F | Ht 67.0 in | Wt 205.0 lb

## 2017-10-19 DIAGNOSIS — D509 Iron deficiency anemia, unspecified: Secondary | ICD-10-CM | POA: Diagnosis present

## 2017-10-19 DIAGNOSIS — K5732 Diverticulitis of large intestine without perforation or abscess without bleeding: Secondary | ICD-10-CM

## 2017-10-19 DIAGNOSIS — R1013 Epigastric pain: Secondary | ICD-10-CM

## 2017-10-19 DIAGNOSIS — D508 Other iron deficiency anemias: Secondary | ICD-10-CM

## 2017-10-19 LAB — URINALYSIS, COMPLETE (UACMP) WITH MICROSCOPIC
BILIRUBIN URINE: NEGATIVE
Glucose, UA: NEGATIVE mg/dL
Hgb urine dipstick: NEGATIVE
KETONES UR: NEGATIVE mg/dL
LEUKOCYTES UA: NEGATIVE
Nitrite: NEGATIVE
Protein, ur: NEGATIVE mg/dL
Specific Gravity, Urine: 1.017 (ref 1.005–1.030)
pH: 6 (ref 5.0–8.0)

## 2017-10-19 LAB — IRON AND TIBC
IRON: 9 ug/dL — AB (ref 28–170)
Saturation Ratios: 2 % — ABNORMAL LOW (ref 10.4–31.8)
TIBC: 466 ug/dL — AB (ref 250–450)
UIBC: 457 ug/dL

## 2017-10-19 LAB — FOLATE: FOLATE: 20.5 ng/mL (ref >5.9)

## 2017-10-19 LAB — VITAMIN B12: VITAMIN B 12: 156 pg/mL — AB (ref 180–914)

## 2017-10-19 LAB — FERRITIN: Ferritin: 4 ng/mL — ABNORMAL LOW (ref 11–307)

## 2017-10-19 NOTE — Telephone Encounter (Signed)
-----   Message from Jonathon Bellows, MD sent at 10/19/2017 11:13 AM EDT ----- She is iron deficient-commence on ferrous sulphate 325 mg BID , commence on a stool softner. Will need EGD at the same time she has colonoscpy based on CT scan results   C/c  Mikey College, NP

## 2017-10-19 NOTE — Telephone Encounter (Signed)
Patient gave permission for voicemail message.   Scheduled CT Abd w/ contrast 11/7 @ 845am -pickup contrast -NPO 4 hours prior

## 2017-10-19 NOTE — Addendum Note (Signed)
Addended by: Peggye Ley on: 10/19/2017 10:06 AM   Modules accepted: Orders

## 2017-10-19 NOTE — Telephone Encounter (Signed)
Advised patient of urinalysis re-take. And result notes per Dr. Vicente Males.   She is iron deficient-commence on ferrous sulphate 325 mg BID , commence on a stool softner. Will need EGD at the same time she has colonoscpy based on CT scan results

## 2017-10-19 NOTE — Progress Notes (Signed)
She is here today to follow up for diverticulitis.      Jonathon Bellows MD, MRCP(U.K) [address]  [place]  [place], [place] [address]  Main: [phone]  Fax: [phone]   Primary Care Physician: Mikey College, NP  Primary Gastroenterologist:  Dr. Jonathon Bellows   Chief Complaint  Patient presents with  . Abdominal Cramping    HPI: Regina Patel is a 46 y.o. female   Summary of history : She was initially referred and seen on 08/31/17 for diverticulitis. She was see discharged from the hospital with diverticulitis with an abscess that needed drainage by IR 07/23/17 - The Ct scan showed possible perforation in the sigmoid colon from a neoplasm vs ischemic colitis. Regina Patel She was subsequently seen in the ER on 08/10/17 for some blood in her stool, a repeat scan showed a short segment of circumferential sigmoid wall thickening which could repesent a stricture , mild inflammation was seen in the pelvis.  09/02/17- CT scan of the abdomen showed evidence of recurrent diverticulitis of the sigmoid colon with no focal abscess   She follows with Dr Dahlia Byes  Interval history   08/31/2017-  09/16/2017   Labs 08/2017 - Hb 10.9, MCV 78. Informed she needed iron studies,b12,folate- did not obtain  She completed 2 weeks of antibiotics 09/17/17 . She says that since last visit- some cramping lasting for a few hours and then felt normal. On Sunday had some cramping .Overall she feels better than before. Alternating between constipation and diarrhea. No clear history of fevers. Sees blood in her stool from time to time.    Current Outpatient Prescriptions  Medication Sig Dispense Refill  . APPLE CIDER VINEGAR PO Take 1 tablet by mouth daily.    . Biotin 10 MG TABS Take by mouth.    . dicyclomine (BENTYL) 10 MG capsule Take 1 capsule (10 mg total) by mouth 4 (four) times daily -  before meals and at bedtime. 90 capsule 0  . IRON, IRON, Take by mouth.    . levothyroxine (SYNTHROID, LEVOTHROID)  175 MCG tablet Take 1 tablet (175 mcg total) by mouth daily before breakfast. 30 tablet 11  . Multiple Vitamin tablet Take by mouth.    . OMEGA-3 FATTY ACIDS PO Take by mouth.    Regina Patel VITAMIN D, CHOLECALCIFEROL, PO Take 1 tablet by mouth daily.     No current facility-administered medications for this visit.     Allergies as of 10/19/2017  . (No Known Allergies)    ROS:  General: Negative for anorexia, weight loss, fever, chills, fatigue, weakness. ENT: Negative for hoarseness, difficulty swallowing , nasal congestion. CV: Negative for chest pain, angina, palpitations, dyspnea on exertion, peripheral edema.  Respiratory: Negative for dyspnea at rest, dyspnea on exertion, cough, sputum, wheezing.  GI: See history of present illness. GU:  Negative for dysuria, hematuria, urinary incontinence, urinary frequency, nocturnal urination.  Endo: Negative for unusual weight change.    Physical Examination:   BP (!) 143/64 (BP Location: Right Arm, Patient Position: Sitting, Cuff Size: Large)   Pulse 93   Temp 98 F (36.7 C) (Oral)   Ht 5\' 7"  (1.702 m)   Wt 205 lb (93 kg)   BMI 32.11 kg/m   General: Well-nourished, well-developed in no acute distress.  Eyes: No icterus. Conjunctivae pink. Mouth: Oropharyngeal mucosa moist and pink , no lesions erythema or exudate. Lungs: Clear to auscultation bilaterally. Non-labored. Heart: Regular rate and rhythm, no murmurs rubs or gallops.  Abdomen: Bowel  sounds are normal, nontender, nondistended, no hepatosplenomegaly or masses, no abdominal bruits or hernia , no rebound or guarding.   Extremities: No lower extremity edema. No clubbing or deformities. Neuro: Alert and oriented x 3.  Grossly intact. Skin: Warm and dry, no jaundice.   Psych: Alert and cooperative, normal mood and affect.   Imaging Studies: No results found.  Assessment and Plan:   [name] is a 47 y.o. y/o female is here to follow up for recurrent diverticulitis, She was  recently admitted with a perforated sigmoid colonic diverticulitis, s/p IR drain, CT scan concerning for sigmoid stricture vs neoplasm. Has had a few rounds of antibiotics. Now has some symptoms off antibiotics. Will obtain CT scan to check for resolution and if resolved will proceed with colonoscopy . Will also obtain iron studies and if iron deficient will also need an EGD at the same time. If diverticulitis has not resolved then will discuss with Dr Dahlia Byes the next steps ?surgery vs flexible sigmoidoscopy to r/o neoplasm.   Dr Jonathon Bellows  MD,MRCP [facility]) Follow up in 6 weeks

## 2017-10-20 ENCOUNTER — Other Ambulatory Visit
Admission: RE | Admit: 2017-10-20 | Discharge: 2017-10-20 | Disposition: A | Discharge status: Home / Self Care | Payer: Managed Care, Other (non HMO) | Source: Ambulatory Visit | Attending: Gastroenterology | Admitting: Gastroenterology

## 2017-10-20 DIAGNOSIS — R1013 Epigastric pain: Secondary | ICD-10-CM | POA: Diagnosis present

## 2017-10-20 DIAGNOSIS — K5732 Diverticulitis of large intestine without perforation or abscess without bleeding: Secondary | ICD-10-CM

## 2017-10-20 DIAGNOSIS — D508 Other iron deficiency anemias: Secondary | ICD-10-CM

## 2017-10-20 LAB — URINALYSIS, COMPLETE (UACMP) WITH MICROSCOPIC
BACTERIA UA: NONE SEEN
BILIRUBIN URINE: NEGATIVE
Glucose, UA: NEGATIVE mg/dL
Hgb urine dipstick: NEGATIVE
KETONES UR: NEGATIVE mg/dL
Leukocytes, UA: NEGATIVE
NITRITE: NEGATIVE
PH: 6 (ref 5.0–8.0)
Protein, ur: NEGATIVE mg/dL
RBC / HPF: NONE SEEN RBC/hpf (ref 0–5)
Specific Gravity, Urine: 1.004 — ABNORMAL LOW (ref 1.005–1.030)

## 2017-10-26 ENCOUNTER — Ambulatory Visit
Admission: RE | Admit: 2017-10-26 | Discharge: 2017-10-26 | Disposition: A | Discharge status: Home / Self Care | Payer: Managed Care, Other (non HMO) | Source: Ambulatory Visit | Attending: Gastroenterology | Admitting: Gastroenterology

## 2017-10-26 ENCOUNTER — Other Ambulatory Visit: Payer: Self-pay

## 2017-10-26 ENCOUNTER — Other Ambulatory Visit: Payer: Self-pay | Admitting: Nurse Practitioner

## 2017-10-26 DIAGNOSIS — R1013 Epigastric pain: Secondary | ICD-10-CM | POA: Diagnosis present

## 2017-10-26 DIAGNOSIS — E538 Deficiency of other specified B group vitamins: Secondary | ICD-10-CM

## 2017-10-26 DIAGNOSIS — K5792 Diverticulitis of intestine, part unspecified, without perforation or abscess without bleeding: Secondary | ICD-10-CM | POA: Insufficient documentation

## 2017-10-26 MED ORDER — CYANOCOBALAMIN 1000 MCG/ML IJ SOLN: 1000.0000 ug | Freq: Once | INTRAMUSCULAR | Status: DC

## 2017-10-26 MED ORDER — CYANOCOBALAMIN 1000 MCG/ML IJ SOLN: 1000.0000 ug | INTRAMUSCULAR | 0 refills | Status: DC

## 2017-10-26 MED ORDER — IOPAMIDOL (ISOVUE-300) INJECTION 61%: 100.0000 mL | Freq: Once | INTRAVENOUS | Status: DC | PRN

## 2017-10-27 ENCOUNTER — Telehealth: Payer: Self-pay

## 2017-10-27 NOTE — Telephone Encounter (Signed)
Advised patient of results per Dr. Vicente Males.   Regina Patel please inform she has b12 deficiency in addition to iron deficiency - could be from pernicious anemia. Suggest follow up with Mikey College, NP for b12 injections .

## 2017-10-28 ENCOUNTER — Telehealth: Payer: Self-pay

## 2017-10-28 ENCOUNTER — Other Ambulatory Visit: Payer: Self-pay

## 2017-10-28 DIAGNOSIS — K5732 Diverticulitis of large intestine without perforation or abscess without bleeding: Secondary | ICD-10-CM

## 2017-10-28 DIAGNOSIS — K5720 Diverticulitis of large intestine with perforation and abscess without bleeding: Secondary | ICD-10-CM

## 2017-10-28 MED ORDER — AMOXICILLIN-POT CLAVULANATE 875-125 MG PO TABS: 1.0000 | ORAL_TABLET | Freq: Two times a day (BID) | ORAL | 0 refills | Status: DC

## 2017-10-28 NOTE — Telephone Encounter (Signed)
Advised pt of results / procedures per Dr. Vicente Males.   1. Still has active diverticultitis on CT scan  2. Discussed with Dr Dahlia Byes yesterday- plan is to start on augmentin 875 mg TID for 14 days  3. After 7 days of antibiotics and while on antibiotics lets plan a flexible sigmoidoscopy with full colon prep. If patient is unable to tolerate the procedure at that time due to pain then will directly go to surgery otherwise will ensure her colon is looked with the scope and then subsequently can go for surgery  4. Check for penicillin allergy  Explained everything to patient and ordered Augmentin.   Patient scheduled Flex Sig for 11/19 and will start meds on Saturday, 11/10.  *No penicillin allergy

## 2017-10-31 ENCOUNTER — Ambulatory Visit (INDEPENDENT_AMBULATORY_CARE_PROVIDER_SITE_OTHER): Payer: Managed Care, Other (non HMO)

## 2017-10-31 ENCOUNTER — Other Ambulatory Visit: Payer: Self-pay | Admitting: Nurse Practitioner

## 2017-10-31 DIAGNOSIS — D6489 Other specified anemias: Secondary | ICD-10-CM

## 2017-10-31 DIAGNOSIS — E538 Deficiency of other specified B group vitamins: Secondary | ICD-10-CM | POA: Diagnosis not present

## 2017-10-31 MED ORDER — CYANOCOBALAMIN 1000 MCG/ML IJ SOLN
1000.0000 ug | Freq: Once | INTRAMUSCULAR | Status: AC
  Administered 2017-10-31: 1000 ug via INTRAMUSCULAR

## 2017-10-31 MED ORDER — CYANOCOBALAMIN 1000 MCG/ML IJ SOLN: 1000.0000 ug | Freq: Once | INTRAMUSCULAR | 0 refills | Status: AC

## 2017-11-07 ENCOUNTER — Encounter: Payer: Self-pay | Admitting: *Deleted

## 2017-11-07 ENCOUNTER — Encounter
Admission: RE | Disposition: A | Discharge status: Home / Self Care | Payer: Self-pay | Source: Ambulatory Visit | Attending: Gastroenterology

## 2017-11-07 ENCOUNTER — Ambulatory Visit: Payer: Managed Care, Other (non HMO) | Admitting: Anesthesiology

## 2017-11-07 ENCOUNTER — Ambulatory Visit
Admission: RE | Admit: 2017-11-07 | Discharge: 2017-11-07 | Disposition: A | Discharge status: Home / Self Care | Payer: Managed Care, Other (non HMO) | Source: Ambulatory Visit | Attending: Gastroenterology | Admitting: Gastroenterology

## 2017-11-07 DIAGNOSIS — E039 Hypothyroidism, unspecified: Secondary | ICD-10-CM | POA: Insufficient documentation

## 2017-11-07 DIAGNOSIS — Z79899 Other long term (current) drug therapy: Secondary | ICD-10-CM | POA: Diagnosis not present

## 2017-11-07 DIAGNOSIS — E785 Hyperlipidemia, unspecified: Secondary | ICD-10-CM | POA: Diagnosis not present

## 2017-11-07 DIAGNOSIS — D125 Benign neoplasm of sigmoid colon: Secondary | ICD-10-CM | POA: Diagnosis not present

## 2017-11-07 DIAGNOSIS — Z09 Encounter for follow-up examination after completed treatment for conditions other than malignant neoplasm: Secondary | ICD-10-CM | POA: Insufficient documentation

## 2017-11-07 DIAGNOSIS — K5720 Diverticulitis of large intestine with perforation and abscess without bleeding: Secondary | ICD-10-CM | POA: Diagnosis not present

## 2017-11-07 DIAGNOSIS — Z803 Family history of malignant neoplasm of breast: Secondary | ICD-10-CM | POA: Insufficient documentation

## 2017-11-07 HISTORY — PX: FLEXIBLE SIGMOIDOSCOPY: SHX5431

## 2017-11-07 SURGERY — SIGMOIDOSCOPY, FLEXIBLE
Anesthesia: General

## 2017-11-07 MED ORDER — PROPOFOL 10 MG/ML IV BOLUS
INTRAVENOUS | Status: DC | PRN
  Administered 2017-11-07 (×2): 30 mg via INTRAVENOUS
  Administered 2017-11-07: 20 mg via INTRAVENOUS
  Administered 2017-11-07: 50 mg via INTRAVENOUS

## 2017-11-07 MED ORDER — MIDAZOLAM HCL 2 MG/2ML IJ SOLN
INTRAMUSCULAR | Status: DC | PRN
  Administered 2017-11-07: 2 mg via INTRAVENOUS

## 2017-11-07 MED ORDER — PROPOFOL 10 MG/ML IV BOLUS
INTRAVENOUS | Status: AC
  Filled 2017-11-07: qty 20

## 2017-11-07 MED ORDER — SODIUM CHLORIDE 0.9 % IV SOLN
INTRAVENOUS | Status: DC
  Administered 2017-11-07: 1000 mL via INTRAVENOUS

## 2017-11-07 MED ORDER — MIDAZOLAM HCL 2 MG/2ML IJ SOLN
INTRAMUSCULAR | Status: AC
  Filled 2017-11-07: qty 2

## 2017-11-07 MED ORDER — LIDOCAINE HCL (PF) 1 % IJ SOLN
2.0000 mL | Freq: Once | INTRAMUSCULAR | Status: AC
  Administered 2017-11-07: 0.3 mL via INTRADERMAL

## 2017-11-07 MED ORDER — LIDOCAINE HCL (PF) 1 % IJ SOLN
INTRAMUSCULAR | Status: AC
  Administered 2017-11-07: 0.3 mL via INTRADERMAL
  Filled 2017-11-07: qty 2

## 2017-11-07 NOTE — H&P (Signed)
Jonathon Bellows, MD [address] [place], [place], [place], [address] [address], [place], [place], [place], [address] Phone: [phone]  Fax: [phone]  Primary Care Physician:  Mikey College, NP   Pre-Procedure History & Physical: HPI:  Regina Patel is a 46 y.o. female is here for a flexible sigmodioscopy/colonoscopy.   Past Medical History:  Diagnosis Date  . Hyperlipidemia   . Hyperthyroidism   . Thyroid disease     Past Surgical History:  Procedure Laterality Date  . MOUTH SURGERY    . TOTAL ABDOMINAL HYSTERECTOMY  05/20/2014   ooperectomy unilateral    Prior to Admission medications   Medication Sig Start Date End Date Taking? Authorizing Provider  amoxicillin-clavulanate (AUGMENTIN) 875-125 MG tablet Take 1 tablet 2 (two) times daily for 14 days by mouth. 10/28/17 11/11/17  Jonathon Bellows, MD  APPLE CIDER VINEGAR PO Take 1 tablet by mouth daily.    [provider]  Biotin 10 MG TABS Take by mouth.    [provider]  cyanocobalamin (,VITAMIN B-12,) 1000 MCG/ML injection Inject 1 mL (1,000 mcg total) once for 1 dose into the muscle. 11/14/17 11/14/17  Mikey College, NP  dicyclomine (BENTYL) 10 MG capsule Take 1 capsule (10 mg total) by mouth 4 (four) times daily -  before meals and at bedtime. 10/07/17   Jonathon Bellows, MD  IRON, IRON, Take by mouth.    [provider]  levothyroxine (SYNTHROID, LEVOTHROID) 175 MCG tablet Take 1 tablet (175 mcg total) by mouth daily before breakfast. 05/03/17   Mikey College, NP  Multiple Vitamin tablet Take by mouth.    [provider]  OMEGA-3 FATTY ACIDS PO Take by mouth.    [provider]  VITAMIN D, CHOLECALCIFEROL, PO Take 1 tablet by mouth daily.    [provider]    Allergies as of 10/28/2017  . (No Known Allergies)    Family History  Problem Relation Age of Onset  . Breast cancer Mother 24    Social History   Socioeconomic History  .  Marital status: Married    Spouse name: Not on file  . Number of children: Not on file  . Years of education: Not on file  . Highest education level: Not on file  Social Needs  . Financial resource strain: Not on file  . Food insecurity - worry: Not on file  . Food insecurity - inability: Not on file  . Transportation needs - medical: Not on file  . Transportation needs - non-medical: Not on file  Occupational History  . Not on file  Tobacco Use  . Smoking status: Never Smoker  . Smokeless tobacco: Never Used  Substance and Sexual Activity  . Alcohol use: No    Alcohol/week: 0.0 oz  . Drug use: No  . Sexual activity: Yes  Other Topics Concern  . Not on file  Social History Narrative  . Not on file    Review of Systems: See HPI, otherwise negative ROS  Physical Exam: BP (!) 145/81   Pulse 82   Temp 98.5 F (36.9 C) (Tympanic)   Resp 17   Ht 5\' 7"  (1.702 m)   Wt 205 lb (93 kg)   SpO2 100%   BMI 32.11 kg/m  General:   Alert,  pleasant and cooperative in NAD Head:  Normocephalic and atraumatic. Neck:  Supple; no masses or thyromegaly. Lungs:  Clear throughout to auscultation, normal respiratory effort.    Heart:  +  S1, +S2, Regular rate and rhythm, No edema. Abdomen:  Soft, nontender and nondistended. Normal bowel sounds, without guarding, and without rebound.   Neurologic:  Alert and  oriented x4;  grossly normal neurologically.  Impression/Plan: Regina Patel is here for an flexible sigmoidoscopy/colonoscopy to be performed for diverticulitis.  Risks, benefits, limitations, and alternatives regarding  colonoscopy have been reviewed with the patient.  Questions have been answered.  All parties agreeable.   Jonathon Bellows, MD  11/07/2017, 9:19 AM

## 2017-11-07 NOTE — Anesthesia Postprocedure Evaluation (Signed)
Anesthesia Post Note  Patient: Regina Patel  Procedure(s) Performed: FLEXIBLE SIGMOIDOSCOPY (N/A )  Patient location during evaluation: PACU Anesthesia Type: General Level of consciousness: awake Pain management: pain level controlled Vital Signs Assessment: post-procedure vital signs reviewed and stable Respiratory status: spontaneous breathing Cardiovascular status: stable Anesthetic complications: no     Last Vitals:  Vitals:   11/07/17 1022 11/07/17 1032  BP: 118/77 116/76  Pulse: 82 77  Resp: (!) 21 12  Temp:    SpO2: 100% 100%    Last Pain:  Vitals:   11/07/17 1002  TempSrc: Tympanic                 VAN STAVEREN,[name]

## 2017-11-07 NOTE — Op Note (Signed)
[facility] Gastroenterology Patient Name: Regina Patel Procedure Date: 11/07/2017 9:42 AM MRN: [id number] Account #: [id number] Date of Birth: [birth year] Admit Type: Outpatient Age: 46 Room: [facility] ENDO ROOM 4 Gender: Female Note Status: Finalized Procedure:            Flexible Sigmoidoscopy Indications:          Follow-up of diverticulitis Providers:            Jonathon Bellows MD, MD Referring MD:         Cherre Robins, NP Medicines:            Monitored Anesthesia Care Complications:        No immediate complications. Procedure:            Pre-Anesthesia Assessment:                       - Prior to the procedure, a History and Physical was                        performed, and patient medications, allergies and                        sensitivities were reviewed. The patient's tolerance of                        previous anesthesia was reviewed.                       - The risks and benefits of the procedure and the                        sedation options and risks were discussed with the                        patient. All questions were answered and informed                        consent was obtained.                       - ASA Grade Assessment: II - A patient with mild                        systemic disease.                       After obtaining informed consent, the scope was passed                        under direct vision. The Colonoscope was introduced                        through the anus The procedure was aborted. The scope                        was not inserted. left transverse colon Medications                        were sigmoid colon. The flexible sigmoidoscopy was  accomplished with ease. The patient tolerated the                        procedure well. The quality of the bowel preparation                        was adequate. Findings:      The perianal and digital rectal examinations were normal.      An infiltrative  completely obstructing large mass was found in the       recto-sigmoid colon. The mass was circumferential. No bleeding was       present. The lumen was reduced to probably 4-5 mm in size, could not go       past the lesion . Biopsies were taken with a cold forceps for histology.       Area was tattooed with an injection of 5 mL of Spot (carbon black).      The exam was otherwise without abnormality. 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 Biopsied.                       - The examination was otherwise normal. Recommendation:       - Discharge patient to home (with escort).                       - She will require a repeat colonoscopy after surgery                        or intraoperative to evaluate the remained of the colon                        to rule out syncronous lesions Procedure Code(s):    --- Professional ---                       [phone], 52, Sigmoidoscopy, flexible; with biopsy, single                        or multiple                       [id number], 52, Sigmoidoscopy, flexible; with directed                        submucosal injection(s), any substance Diagnosis Code(s):    --- Professional ---                       D49.0, Neoplasm of unspecified behavior of digestive                        system                       K57.32, Diverticulitis of large intestine without                        perforation or abscess without bleeding CPT copyright 2016 American Medical Association. All rights reserved. The codes  documented in this report are preliminary and upon coder review may  be revised to meet current compliance requirements. Jonathon Bellows, MD Jonathon Bellows MD, MD 11/07/2017 10:05:39 AM This report has been signed electronically. Number of Addenda: 0 Note Initiated On: 11/07/2017 9:42 AM Total Procedure Duration: 0 hours 9 minutes 57 seconds       [facility]

## 2017-11-07 NOTE — Anesthesia Preprocedure Evaluation (Signed)
Anesthesia Evaluation  Patient identified by MRN, date of birth, ID band Patient awake    Reviewed: Allergy & Precautions, NPO status , Patient's Chart, lab work & pertinent test results  Airway Mallampati: II       Dental  (+) Teeth Intact   Pulmonary neg pulmonary ROS,    breath sounds clear to auscultation       Cardiovascular Exercise Tolerance: Good  Rhythm:Regular Rate:Normal     Neuro/Psych negative neurological ROS  negative psych ROS   GI/Hepatic negative GI ROS, Neg liver ROS,   Endo/Other  Hypothyroidism   Renal/GU negative Renal ROS     Musculoskeletal negative musculoskeletal ROS (+)   Abdominal Normal abdominal exam  (+)   Peds negative pediatric ROS (+)  Hematology   Anesthesia Other Findings   Reproductive/Obstetrics                             Anesthesia Physical Anesthesia Plan  ASA: II  Anesthesia Plan: General   Post-op Pain Management:    Induction: Intravenous  PONV Risk Score and Plan: 0  Airway Management Planned: Natural Airway and Nasal Cannula  Additional Equipment:   Intra-op Plan:   Post-operative Plan:   Informed Consent: I have reviewed the patients History and Physical, chart, labs and discussed the procedure including the risks, benefits and alternatives for the proposed anesthesia with the patient or authorized representative who has indicated his/her understanding and acceptance.     Plan Discussed with: CRNA  Anesthesia Plan Comments:         Anesthesia Quick Evaluation

## 2017-11-07 NOTE — Anesthesia Post-op Follow-up Note (Signed)
Anesthesia QCDR form completed.        

## 2017-11-07 NOTE — Transfer of Care (Signed)
Immediate Anesthesia Transfer of Care Note  Patient: Regina Patel  Procedure(s) Performed: FLEXIBLE SIGMOIDOSCOPY (N/A )  Patient Location: PACU and Endoscopy Unit  Anesthesia Type:General  Level of Consciousness: awake, alert  and oriented  Airway & Oxygen Therapy: Patient Spontanous Breathing and Patient connected to nasal cannula oxygen  Post-op Assessment: Report given to RN and Post -op Vital signs reviewed and stable  Post vital signs: Reviewed and stable  Last Vitals:  Vitals:   11/07/17 0915 11/07/17 1002  BP: (!) 145/81 (!) 103/56  Pulse: 82 82  Resp: 17 20  Temp: 36.9 C 36.7 C  SpO2: 100% 100%    Last Pain:  Vitals:   11/07/17 1002  TempSrc: Tympanic         Complications: No apparent anesthesia complications

## 2017-11-08 ENCOUNTER — Ambulatory Visit (INDEPENDENT_AMBULATORY_CARE_PROVIDER_SITE_OTHER): Payer: Managed Care, Other (non HMO) | Admitting: Surgery

## 2017-11-08 ENCOUNTER — Encounter: Payer: Self-pay | Admitting: Gastroenterology

## 2017-11-08 VITALS — BP 168/93 | HR 96 | Temp 98.1°F | Ht 67.0 in | Wt 205.8 lb

## 2017-11-08 DIAGNOSIS — K639 Disease of intestine, unspecified: Secondary | ICD-10-CM | POA: Diagnosis not present

## 2017-11-08 DIAGNOSIS — K6389 Other specified diseases of intestine: Secondary | ICD-10-CM

## 2017-11-08 MED ORDER — POLYETHYLENE GLYCOL 3350 17 GM/SCOOP PO POWD: ORAL | 0 refills | Status: DC

## 2017-11-08 MED ORDER — BISACODYL 5 MG PO TBEC: 5.0000 mg | DELAYED_RELEASE_TABLET | Freq: Every day | ORAL | 0 refills | Status: DC | PRN

## 2017-11-08 MED ORDER — NEOMYCIN SULFATE 500 MG PO TABS: ORAL_TABLET | ORAL | 0 refills | Status: DC

## 2017-11-08 MED ORDER — ERYTHROMYCIN BASE 500 MG PO TABS: ORAL_TABLET | ORAL | 0 refills | Status: DC

## 2017-11-08 NOTE — Patient Instructions (Signed)
We have seen you today in regards to the Mass in your Colon.  Our plan going forward will be: 11/11/17 with Dr. Dahlia Byes.  Please see your appointment below.  Please call our office and speak with a nurse if you have any questions or concerns prior to your next appointment.  Colon Mass, Adult A colon mass is a growth in your colon. The colon is your large intestine. What are the causes? Many things can cause a colon mass, including:  Cancer.  Blood vessel problems.  Infection.  Inflammatory bowel disease (IBS).  Diverticulitis. This is inflammation or infection of small pouches in your colon.  Endometriosis. This is a condition in which the tissue that lines the uterus grows outside of its normal location.  Volvulus. This is a condition in which the colon twists on the organ or tissue that supports the colon (mesentery).  What are the signs or symptoms? Symptoms of a colon mass include:  Cramping.  Nausea.  Diarrhea.  Fever.  Vomiting.  Feeling weak.  Pain in the abdomen, side, or back.  Weight loss.  Constipation.  Bleeding from your rectum.  Changes in bowel habits.  Feeling the need for bowel a movement despite having had one recently.  In some cases, no symptoms are present. How is this diagnosed? To make a diagnosis, your health care provider will need to learn more about the mass. You may have tests or procedures done, such as:  Blood tests.  X-rays.  An ultrasound.  A CT scan.  An MRI.  A colonoscopy.  A biopsy of the mass.  In some cases, what seemed like a colon mass may actually be something else, such as scarring that formed after a surgery or an ulcer. How is this treated? Treatment will depend on the cause of the mass. Your health care provider will discuss your test results with you, the meaning of the tests, and the recommended steps for starting treatment. In serious cases, emergency surgery may be recommended immediately. Follow  these instructions at home: What you need to do at home will depend on the cause of the mass. Follow the instructions that your health care provider gives to you. In general:  Keep all follow-up visits as directed by your health care provider. This is important.  Take medicines only as directed by your health care provider.  Contact a health care provider if:  You develop new symptoms.  You have a fever.  Your pain does not get better with medicine.  You feel weaker.  You develop easy bruising or bleeding. Get help right away if:  You vomit bright red blood or material that looks like coffee grounds.  You have blood in your stools, or your stools turn black and tarry.  You faint.  You feel that the mass has suddenly gotten larger.  You develop severe bloating in your abdomen. This information is not intended to replace advice given to you by your health care provider. Make sure you discuss any questions you have with your health care provider. Document Released: 03/15/2007 Document Revised: 05/13/2016 Document Reviewed: 07/07/2014 Elsevier Interactive Patient Education  [employer].

## 2017-11-09 ENCOUNTER — Other Ambulatory Visit: Payer: Self-pay

## 2017-11-09 ENCOUNTER — Encounter: Payer: Self-pay | Admitting: Surgery

## 2017-11-09 ENCOUNTER — Telehealth: Payer: Self-pay | Admitting: Surgery

## 2017-11-09 ENCOUNTER — Encounter
Admission: RE | Admit: 2017-11-09 | Discharge: 2017-11-09 | Disposition: A | Discharge status: Home / Self Care | Payer: Managed Care, Other (non HMO) | Source: Ambulatory Visit | Attending: Surgery | Admitting: Surgery

## 2017-11-09 HISTORY — DX: Headache: R51

## 2017-11-09 HISTORY — DX: Anemia, unspecified: D64.9

## 2017-11-09 HISTORY — DX: Headache, unspecified: R51.9

## 2017-11-09 HISTORY — DX: Cardiac murmur, unspecified: R01.1

## 2017-11-09 HISTORY — DX: Hypothyroidism, unspecified: E03.9

## 2017-11-09 HISTORY — DX: Diverticulosis of intestine, part unspecified, without perforation or abscess without bleeding: K57.90

## 2017-11-09 LAB — BASIC METABOLIC PANEL
ANION GAP: 8 (ref 5–15)
BUN: 7 mg/dL (ref 6–20)
CHLORIDE: 107 mmol/L (ref 101–111)
CO2: 23 mmol/L (ref 22–32)
Calcium: 9 mg/dL (ref 8.9–10.3)
Creatinine, Ser: 0.6 mg/dL (ref 0.44–1.00)
GFR calc Af Amer: >60 mL/min (ref >60)
GLUCOSE: 88 mg/dL (ref 65–99)
POTASSIUM: 3.8 mmol/L (ref 3.5–5.1)
Sodium: 138 mmol/L (ref 135–145)

## 2017-11-09 LAB — CBC WITH DIFFERENTIAL/PLATELET
BASOS ABS: 0 10*3/uL (ref 0–0.1)
Basophils Relative: 0 %
Eosinophils Absolute: 0.1 10*3/uL (ref 0–0.7)
Eosinophils Relative: 1 %
HEMATOCRIT: 33.8 % — AB (ref 35.0–47.0)
Hemoglobin: 10.9 g/dL — ABNORMAL LOW (ref 12.0–16.0)
LYMPHS PCT: 32 %
Lymphs Abs: 1.9 10*3/uL (ref 1.0–3.6)
MCH: 26 pg (ref 26.0–34.0)
MCHC: 32.2 g/dL (ref 32.0–36.0)
MCV: 80.8 fL (ref 80.0–100.0)
MONO ABS: 0.4 10*3/uL (ref 0.2–0.9)
MONOS PCT: 6 %
NEUTROS ABS: 3.6 10*3/uL (ref 1.4–6.5)
Neutrophils Relative %: 61 %
Platelets: 284 10*3/uL (ref 150–440)
RBC: 4.18 MIL/uL (ref 3.80–5.20)
RDW: 20.7 % — AB (ref 11.5–14.5)
WBC: 6 10*3/uL (ref 3.6–11.0)

## 2017-11-09 LAB — SURGICAL PATHOLOGY

## 2017-11-09 LAB — SURGICAL PCR SCREEN
MRSA, PCR: NEGATIVE
Staphylococcus aureus: NEGATIVE

## 2017-11-09 NOTE — Progress Notes (Signed)
Patient ID: Regina Patel, female   DOB: 03-12-1971, 47 y.o.   MRN: [id number]  HPI Regina Patel is a 46 y.o. female with a known previous history of recurrent diverticulitis treated with prolonged antibiotic therapy. Had multiple CT scans at a half personally reviewed. I have discussed the case in detail with Dr. Vicente Males and he did have flexible sigmoidoscopy couple days ago showing a circumferential obstructing lesion in the sigmoid colon. She is now here for further discussion. I have personally reviewed the images for the colonoscopy is certainly looks like a malignant lesion. The pathologist pending at this time. She has some intermittent pain but she is tolerating by mouth. She is passing gas but lately's feels more constipated. Personally reviewed her CT scan showing no evidence of metastatic disease. She is able to perform more than 6 Mets of activity without any shortness of breath or chest pain D/W Dr. Vicente Males in detail HPI  Past Medical History:  Diagnosis Date  . Hyperlipidemia   . Hyperthyroidism   . Thyroid disease     Past Surgical History:  Procedure Laterality Date  . FLEXIBLE SIGMOIDOSCOPY N/A 11/07/2017   Procedure: FLEXIBLE SIGMOIDOSCOPY;  Surgeon: Jonathon Bellows, MD;  Location: [facility] ENDOSCOPY;  Service: Gastroenterology;  Laterality: N/A;  . MOUTH SURGERY    . TOTAL ABDOMINAL HYSTERECTOMY  05/20/2014   ooperectomy unilateral    Family History  Problem Relation Age of Onset  . Breast cancer Mother 32    Social History Social History   Tobacco Use  . Smoking status: Never Smoker  . Smokeless tobacco: Never Used  Substance Use Topics  . Alcohol use: No    Alcohol/week: 0.0 oz  . Drug use: No    No Known Allergies  Current Outpatient Medications  Medication Sig Dispense Refill  . amoxicillin-clavulanate (AUGMENTIN) 875-125 MG tablet Take 1 tablet 2 (two) times daily for 14 days by mouth. 28 tablet 0  . APPLE CIDER VINEGAR PO Take 1 tablet by mouth daily.    . Biotin 10  MG TABS Take by mouth.    Derrill Memo ON 11/14/2017] cyanocobalamin (,VITAMIN B-12,) 1000 MCG/ML injection Inject 1 mL (1,000 mcg total) once for 1 dose into the muscle. 1 mL 0  . dicyclomine (BENTYL) 10 MG capsule Take 1 capsule (10 mg total) by mouth 4 (four) times daily -  before meals and at bedtime. 90 capsule 0  . IRON, IRON, Take by mouth.    . levothyroxine (SYNTHROID, LEVOTHROID) 175 MCG tablet Take 1 tablet (175 mcg total) by mouth daily before breakfast. 30 tablet 11  . Multiple Vitamin tablet Take by mouth.    . OMEGA-3 FATTY ACIDS PO Take by mouth.    Marland Kitchen VITAMIN D, CHOLECALCIFEROL, PO Take 1 tablet by mouth daily.    . bisacodyl (DULCOLAX) 5 MG EC tablet Take 1 tablet (5 mg total) by mouth daily as needed for moderate constipation. 4 tablet 0  . erythromycin base (E-MYCIN) 500 MG tablet Please see bowel prep instructions 6 tablet 0  . neomycin (MYCIFRADIN) 500 MG tablet Please see bowel prep instructions 6 tablet 0  . polyethylene glycol powder (GLYCOLAX/MIRALAX) powder Please see bowel prep instructions 255 g 0   No current facility-administered medications for this visit.      Review of Systems Full ROS  was asked and was negative except for the information on the HPI  Physical Exam Blood pressure (!) 168/93, pulse 96, temperature 98.1 F (36.7 C), temperature source Oral, height 5\' 7"  (  1.702 m), weight 93.4 kg (205 lb 12.8 oz). CONSTITUTIONAL: NAD EYES: Pupils are equal, round, and reactive to light, Sclera are non-icteric. EARS, NOSE, MOUTH AND THROAT: The oropharynx is clear. The oral mucosa is pink and moist. Hearing is intact to voice. LYMPH NODES:  Lymph nodes in the neck are normal. RESPIRATORY:  Lungs are clear. There is normal respiratory effort, with equal breath sounds bilaterally, and without pathologic use of accessory muscles. CARDIOVASCULAR: Heart is regular without murmurs, gallops, or rubs. GI: The abdomen is  soft, nontender, and nondistended. Previous  midline scar. There are no palpable masses. There is no hepatosplenomegaly. There are normal bowel sounds in all quadrants. GU: Rectal deferred.   MUSCULOSKELETAL: Normal muscle strength and tone. No cyanosis or edema.   SKIN: Turgor is good and there are no pathologic skin lesions or ulcers. NEUROLOGIC: Motor and sensation is grossly normal. Cranial nerves are grossly intact. PSYCH:  Oriented to person, place and time. Affect is normal.  Data Reviewed  I have personally reviewed the patient's imaging, laboratory findings and medical records.    Assessment/ Plan 46 year old female with obstructing sigmoid lesion on CT scan. Clinically not completely obstructed. The lesion is likely malignant and less likely from a diverticular stricture. In any case the next step would be for a sigmoid colectomy to remove the mass to avoid any impending total colonic obstruction. I do not want to wait for pathology and the patient was returned to go ahead and perform the operation as soon as possible. We'll schedule her for next Friday. Hopefully we can do a bowel prep and a primary anastomosis. Discussed with the patient in detail about the procedure. Risk, benefits and possible complications including but not limited to: Creation of a colostomy, bleeding, infection injury to the ureter. Anastomotic leak and further rate interventions. She understands and wishes to proceed. Please note that I spent about 40 minutes in this encounter with greater than 50% spent in coordination and counseling of her car Caroleen Hamman, MD [place] Surgeon 11/09/2017, 7:12 AM

## 2017-11-09 NOTE — H&P (View-Only) (Signed)
Patient ID: Niamya Vittitow, female   DOB: 15-Aug-1971, 46 y.o.   MRN: [id number]  HPI Eveleen Tilmon is a 46 y.o. female with a known previous history of recurrent diverticulitis treated with prolonged antibiotic therapy. Had multiple CT scans at a half personally reviewed. I have discussed the case in detail with Dr. Vicente Males and he did have flexible sigmoidoscopy couple days ago showing a circumferential obstructing lesion in the sigmoid colon. She is now here for further discussion. I have personally reviewed the images for the colonoscopy is certainly looks like a malignant lesion. The pathologist pending at this time. She has some intermittent pain but she is tolerating by mouth. She is passing gas but lately's feels more constipated. Personally reviewed her CT scan showing no evidence of metastatic disease. She is able to perform more than 6 Mets of activity without any shortness of breath or chest pain D/W Dr. Vicente Males in detail HPI  Past Medical History:  Diagnosis Date  . Hyperlipidemia   . Hyperthyroidism   . Thyroid disease     Past Surgical History:  Procedure Laterality Date  . FLEXIBLE SIGMOIDOSCOPY N/A 11/07/2017   Procedure: FLEXIBLE SIGMOIDOSCOPY;  Surgeon: Jonathon Bellows, MD;  Location: [facility] ENDOSCOPY;  Service: Gastroenterology;  Laterality: N/A;  . MOUTH SURGERY    . TOTAL ABDOMINAL HYSTERECTOMY  05/20/2014   ooperectomy unilateral    Family History  Problem Relation Age of Onset  . Breast cancer Mother 70    Social History Social History   Tobacco Use  . Smoking status: Never Smoker  . Smokeless tobacco: Never Used  Substance Use Topics  . Alcohol use: No    Alcohol/week: 0.0 oz  . Drug use: No    No Known Allergies  Current Outpatient Medications  Medication Sig Dispense Refill  . amoxicillin-clavulanate (AUGMENTIN) 875-125 MG tablet Take 1 tablet 2 (two) times daily for 14 days by mouth. 28 tablet 0  . APPLE CIDER VINEGAR PO Take 1 tablet by mouth daily.    . Biotin 10  MG TABS Take by mouth.    Derrill Memo ON 11/14/2017] cyanocobalamin (,VITAMIN B-12,) 1000 MCG/ML injection Inject 1 mL (1,000 mcg total) once for 1 dose into the muscle. 1 mL 0  . dicyclomine (BENTYL) 10 MG capsule Take 1 capsule (10 mg total) by mouth 4 (four) times daily -  before meals and at bedtime. 90 capsule 0  . IRON, IRON, Take by mouth.    . levothyroxine (SYNTHROID, LEVOTHROID) 175 MCG tablet Take 1 tablet (175 mcg total) by mouth daily before breakfast. 30 tablet 11  . Multiple Vitamin tablet Take by mouth.    . OMEGA-3 FATTY ACIDS PO Take by mouth.    Marland Kitchen VITAMIN D, CHOLECALCIFEROL, PO Take 1 tablet by mouth daily.    . bisacodyl (DULCOLAX) 5 MG EC tablet Take 1 tablet (5 mg total) by mouth daily as needed for moderate constipation. 4 tablet 0  . erythromycin base (E-MYCIN) 500 MG tablet Please see bowel prep instructions 6 tablet 0  . neomycin (MYCIFRADIN) 500 MG tablet Please see bowel prep instructions 6 tablet 0  . polyethylene glycol powder (GLYCOLAX/MIRALAX) powder Please see bowel prep instructions 255 g 0   No current facility-administered medications for this visit.      Review of Systems Full ROS  was asked and was negative except for the information on the HPI  Physical Exam Blood pressure (!) 168/93, pulse 96, temperature 98.1 F (36.7 C), temperature source Oral, height 5\' 7"  (  1.702 m), weight 93.4 kg (205 lb 12.8 oz). CONSTITUTIONAL: NAD EYES: Pupils are equal, round, and reactive to light, Sclera are non-icteric. EARS, NOSE, MOUTH AND THROAT: The oropharynx is clear. The oral mucosa is pink and moist. Hearing is intact to voice. LYMPH NODES:  Lymph nodes in the neck are normal. RESPIRATORY:  Lungs are clear. There is normal respiratory effort, with equal breath sounds bilaterally, and without pathologic use of accessory muscles. CARDIOVASCULAR: Heart is regular without murmurs, gallops, or rubs. GI: The abdomen is  soft, nontender, and nondistended. Previous  midline scar. There are no palpable masses. There is no hepatosplenomegaly. There are normal bowel sounds in all quadrants. GU: Rectal deferred.   MUSCULOSKELETAL: Normal muscle strength and tone. No cyanosis or edema.   SKIN: Turgor is good and there are no pathologic skin lesions or ulcers. NEUROLOGIC: Motor and sensation is grossly normal. Cranial nerves are grossly intact. PSYCH:  Oriented to person, place and time. Affect is normal.  Data Reviewed  I have personally reviewed the patient's imaging, laboratory findings and medical records.    Assessment/ Plan 46 year old female with obstructing sigmoid lesion on CT scan. Clinically not completely obstructed. The lesion is likely malignant and less likely from a diverticular stricture. In any case the next step would be for a sigmoid colectomy to remove the mass to avoid any impending total colonic obstruction. I do not want to wait for pathology and the patient was returned to go ahead and perform the operation as soon as possible. We'll schedule her for next Friday. Hopefully we can do a bowel prep and a primary anastomosis. Discussed with the patient in detail about the procedure. Risk, benefits and possible complications including but not limited to: Creation of a colostomy, bleeding, infection injury to the ureter. Anastomotic leak and further rate interventions. She understands and wishes to proceed. Please note that I spent about 40 minutes in this encounter with greater than 50% spent in coordination and counseling of her car Caroleen Hamman, MD [place] Surgeon 11/09/2017, 7:12 AM

## 2017-11-09 NOTE — Patient Instructions (Signed)
Your procedure is scheduled IE:[id number] 23, 2018 (Friday ) Report to Same Day Surgery.(SECOND FLOOR ) MEDICAL MALL ARRIVAL TIME 7:45 AM.  Remember: Instructions that are not followed completely may result in serious medical risk, up to and including death, or upon the discretion of your surgeon and anesthesiologist your surgery may need to be rescheduled.     _X__ 1. Do not eat food after midnight the night before your procedure.                 No gum chewing or hard candies. You may drink clear liquids up to 2 hours                 before you are scheduled to arrive for your surgery- DO not drink clear                 liquids within 2 hours of the start of your surgery.                 Clear Liquids include:  water, apple juice without pulp, clear carbohydrate                 drink such as Clearfast of Gartorade, Black Coffee or Tea (Do not add                 anything to coffee or tea).     _X__ 2.  No Alcohol for 24 hours before or after surgery.   _X__ 3.  Do Not Smoke or use e-cigarettes For 24 Hours Prior to Your Surgery.                 Do not use any chewable tobacco products for at least 6 hours prior to                 surgery.  ____  4.  Bring all medications with you on the day of surgery if instructed.   __X__  5.  Notify your doctor if there is any change in your medical condition      (cold, fever, infections).     Do not wear jewelry, make-up, hairpins, clips or nail polish. Do not wear lotions, powders, or perfumes.  Do not shave 48 hours prior to surgery. Men may shave face and neck. Do not bring valuables to the hospital.    [facility] is not responsible for any belongings or valuables.  Contacts, dentures or bridgework may not be worn into surgery. Leave your suitcase in the car. After surgery it may be brought to your room. For patients admitted to the hospital, discharge time is determined by your treatment team.   Patients  discharged the day of surgery will not be allowed to drive home.   Please read over the following fact sheets that you were given:   MRSA Information          __X__ Take these medicines the morning of surgery with A SIP OF WATER:    1. LEVOTHYROXINE  2. FOLLOW DR. Dahlia Byes COLON PREP INSTRUCTIONS AS ORDERED  3.   4.  5.  6.  ____ Fleet Enema (as directed)   _X___ Use CHG Soap as directed  ____ Use inhalers on the day of surgery  ____ Stop metformin 2 days prior to surgery    ____ Take 1/2 of usual insulin dose the night before surgery. No insulin the morning          of surgery.   __X__  Stop Coumadin/Plavix/aspirin on (NO ASPIRIN )  __X__ Stop Anti-inflammatories on (NO ASPIRIN PRODUCTS )   __X__ Stop supplements until after surgery.  (STOP FISH OIL, BIOTIN, APPLE CIDER SUPPLEMENTS NOW )  ____ Bring C-Pap to the hospital.

## 2017-11-09 NOTE — Telephone Encounter (Signed)
Pt advised of pre op date/time and sx date. Sx: 11/11/17 with Dr Pabon--laparoscopic sigmoid colectomy. Pre op: 11/09/17 @ 12:30pm--office.   Inpatient approval has been obtained from [facility] # [id number]

## 2017-11-10 MED ORDER — SODIUM CHLORIDE 0.9 % IV SOLN
1.0000 g | INTRAVENOUS | Status: AC
  Administered 2017-11-11: 1 g via INTRAVENOUS
  Filled 2017-11-10: qty 1

## 2017-11-11 ENCOUNTER — Inpatient Hospital Stay: Payer: Managed Care, Other (non HMO) | Admitting: Certified Registered Nurse Anesthetist

## 2017-11-11 ENCOUNTER — Encounter
Admission: RE | Disposition: A | Discharge status: Home / Self Care | Payer: Self-pay | Source: Ambulatory Visit | Attending: Surgery

## 2017-11-11 ENCOUNTER — Inpatient Hospital Stay
Admission: RE | Admit: 2017-11-11 | Discharge: 2017-11-15 | DRG: 330 | Disposition: A | Discharge status: Home / Self Care | Payer: Managed Care, Other (non HMO) | Source: Ambulatory Visit | Attending: Surgery | Admitting: Surgery

## 2017-11-11 ENCOUNTER — Encounter: Payer: Self-pay | Admitting: *Deleted

## 2017-11-11 ENCOUNTER — Other Ambulatory Visit: Payer: Self-pay

## 2017-11-11 DIAGNOSIS — Z803 Family history of malignant neoplasm of breast: Secondary | ICD-10-CM

## 2017-11-11 DIAGNOSIS — K5732 Diverticulitis of large intestine without perforation or abscess without bleeding: Secondary | ICD-10-CM | POA: Diagnosis present

## 2017-11-11 DIAGNOSIS — D6489 Other specified anemias: Secondary | ICD-10-CM | POA: Diagnosis present

## 2017-11-11 DIAGNOSIS — C187 Malignant neoplasm of sigmoid colon: Secondary | ICD-10-CM

## 2017-11-11 DIAGNOSIS — E039 Hypothyroidism, unspecified: Secondary | ICD-10-CM | POA: Diagnosis present

## 2017-11-11 DIAGNOSIS — Z9049 Acquired absence of other specified parts of digestive tract: Secondary | ICD-10-CM

## 2017-11-11 DIAGNOSIS — E785 Hyperlipidemia, unspecified: Secondary | ICD-10-CM | POA: Diagnosis present

## 2017-11-11 DIAGNOSIS — Z79890 Hormone replacement therapy (postmenopausal): Secondary | ICD-10-CM | POA: Diagnosis not present

## 2017-11-11 HISTORY — PX: LAPAROSCOPIC SIGMOID COLECTOMY: SHX5928

## 2017-11-11 HISTORY — DX: Malignant neoplasm of sigmoid colon: C18.7

## 2017-11-11 HISTORY — PX: BOWEL RESECTION: SHX1257

## 2017-11-11 LAB — CBC
HCT: 34.4 % — ABNORMAL LOW (ref 35.0–47.0)
Hemoglobin: 11.3 g/dL — ABNORMAL LOW (ref 12.0–16.0)
MCH: 26.4 pg (ref 26.0–34.0)
MCHC: 32.8 g/dL (ref 32.0–36.0)
MCV: 80.6 fL (ref 80.0–100.0)
PLATELETS: 279 10*3/uL (ref 150–440)
RBC: 4.27 MIL/uL (ref 3.80–5.20)
RDW: 20.7 % — ABNORMAL HIGH (ref 11.5–14.5)
WBC: 10.5 10*3/uL (ref 3.6–11.0)

## 2017-11-11 LAB — CREATININE, SERUM
Creatinine, Ser: 0.73 mg/dL (ref 0.44–1.00)
GFR calc non Af Amer: >60 mL/min (ref >60)

## 2017-11-11 SURGERY — COLECTOMY, SIGMOID, LAPAROSCOPIC
Anesthesia: General | Wound class: Clean Contaminated

## 2017-11-11 MED ORDER — BUPIVACAINE-EPINEPHRINE 0.5% -1:200000 IJ SOLN
INTRAMUSCULAR | Status: DC | PRN
  Administered 2017-11-11: 30 mL

## 2017-11-11 MED ORDER — GABAPENTIN 300 MG PO CAPS
ORAL_CAPSULE | ORAL | Status: AC
Start: 2017-11-11 — End: 2017-11-11
  Administered 2017-11-11: 300 mg via ORAL
  Filled 2017-11-11: qty 1

## 2017-11-11 MED ORDER — PROPOFOL 10 MG/ML IV BOLUS
INTRAVENOUS | Status: DC | PRN
  Administered 2017-11-11: 120 mg via INTRAVENOUS

## 2017-11-11 MED ORDER — DIPHENHYDRAMINE HCL 12.5 MG/5ML PO ELIX
12.5000 mg | ORAL_SOLUTION | Freq: Four times a day (QID) | ORAL | Status: DC | PRN
  Filled 2017-11-11: qty 5

## 2017-11-11 MED ORDER — SODIUM CHLORIDE 0.9 % IV SOLN
INTRAVENOUS | Status: DC | PRN
  Administered 2017-11-11: 70 mL

## 2017-11-11 MED ORDER — ONDANSETRON 4 MG PO TBDP: 4.0000 mg | ORAL_TABLET | Freq: Four times a day (QID) | ORAL | Status: DC | PRN

## 2017-11-11 MED ORDER — ACETAMINOPHEN 500 MG PO TABS
1000.0000 mg | ORAL_TABLET | Freq: Four times a day (QID) | ORAL | Status: DC
  Administered 2017-11-11 – 2017-11-15 (×14): 1000 mg via ORAL
  Filled 2017-11-11 (×15): qty 2

## 2017-11-11 MED ORDER — DEXTROSE IN LACTATED RINGERS 5 % IV SOLN
INTRAVENOUS | Status: DC
  Administered 2017-11-11 – 2017-11-14 (×6): via INTRAVENOUS

## 2017-11-11 MED ORDER — OXYCODONE HCL 5 MG PO TABS: 5.0000 mg | ORAL_TABLET | ORAL | Status: DC | PRN

## 2017-11-11 MED ORDER — LIDOCAINE HCL (CARDIAC) 20 MG/ML IV SOLN
INTRAVENOUS | Status: DC | PRN
  Administered 2017-11-11: 60 mg via INTRAVENOUS

## 2017-11-11 MED ORDER — ACETAMINOPHEN 500 MG PO TABS
ORAL_TABLET | ORAL | Status: AC
  Administered 2017-11-11: 1000 mg via ORAL
  Filled 2017-11-11: qty 2

## 2017-11-11 MED ORDER — FENTANYL CITRATE (PF) 100 MCG/2ML IJ SOLN
INTRAMUSCULAR | Status: AC
  Filled 2017-11-11: qty 2

## 2017-11-11 MED ORDER — KETOROLAC TROMETHAMINE 30 MG/ML IJ SOLN: 30.0000 mg | Freq: Four times a day (QID) | INTRAMUSCULAR | Status: DC | PRN

## 2017-11-11 MED ORDER — PHENYLEPHRINE HCL 10 MG/ML IJ SOLN
INTRAMUSCULAR | Status: DC | PRN
  Administered 2017-11-11: 200 ug via INTRAVENOUS
  Administered 2017-11-11: 100 ug via INTRAVENOUS

## 2017-11-11 MED ORDER — FAMOTIDINE 20 MG PO TABS
ORAL_TABLET | ORAL | Status: AC
  Administered 2017-11-11: 20 mg via ORAL
  Filled 2017-11-11: qty 1

## 2017-11-11 MED ORDER — ONDANSETRON HCL 4 MG/2ML IJ SOLN: 4.0000 mg | Freq: Once | INTRAMUSCULAR | Status: DC | PRN

## 2017-11-11 MED ORDER — KETOROLAC TROMETHAMINE 30 MG/ML IJ SOLN
30.0000 mg | Freq: Four times a day (QID) | INTRAMUSCULAR | Status: DC
  Administered 2017-11-11 – 2017-11-15 (×15): 30 mg via INTRAVENOUS
  Filled 2017-11-11 (×15): qty 1

## 2017-11-11 MED ORDER — DEXAMETHASONE SODIUM PHOSPHATE 10 MG/ML IJ SOLN
INTRAMUSCULAR | Status: DC | PRN
  Administered 2017-11-11: 10 mg via INTRAVENOUS

## 2017-11-11 MED ORDER — CHLORHEXIDINE GLUCONATE CLOTH 2 % EX PADS: 6.0000 | MEDICATED_PAD | Freq: Once | CUTANEOUS | Status: DC

## 2017-11-11 MED ORDER — SODIUM CHLORIDE 0.9 % IJ SOLN
INTRAMUSCULAR | Status: AC
  Filled 2017-11-11: qty 50

## 2017-11-11 MED ORDER — GABAPENTIN 300 MG PO CAPS
300.0000 mg | ORAL_CAPSULE | ORAL | Status: AC
  Administered 2017-11-11: 300 mg via ORAL

## 2017-11-11 MED ORDER — BUPIVACAINE-EPINEPHRINE (PF) 0.5% -1:200000 IJ SOLN
INTRAMUSCULAR | Status: AC
  Filled 2017-11-11: qty 30

## 2017-11-11 MED ORDER — DIPHENHYDRAMINE HCL 50 MG/ML IJ SOLN: 12.5000 mg | Freq: Four times a day (QID) | INTRAMUSCULAR | Status: DC | PRN

## 2017-11-11 MED ORDER — ACETAMINOPHEN 500 MG PO TABS
1000.0000 mg | ORAL_TABLET | ORAL | Status: AC
  Administered 2017-11-11: 1000 mg via ORAL

## 2017-11-11 MED ORDER — FAMOTIDINE 20 MG PO TABS
20.0000 mg | ORAL_TABLET | Freq: Once | ORAL | Status: AC
  Administered 2017-11-11: 20 mg via ORAL

## 2017-11-11 MED ORDER — ROCURONIUM BROMIDE 100 MG/10ML IV SOLN
INTRAVENOUS | Status: DC | PRN
  Administered 2017-11-11: 20 mg via INTRAVENOUS
  Administered 2017-11-11: 50 mg via INTRAVENOUS
  Administered 2017-11-11 (×3): 10 mg via INTRAVENOUS

## 2017-11-11 MED ORDER — PROPOFOL 10 MG/ML IV BOLUS
INTRAVENOUS | Status: AC
  Filled 2017-11-11: qty 20

## 2017-11-11 MED ORDER — FENTANYL CITRATE (PF) 250 MCG/5ML IJ SOLN
INTRAMUSCULAR | Status: AC
  Filled 2017-11-11: qty 5

## 2017-11-11 MED ORDER — ONDANSETRON HCL 4 MG/2ML IJ SOLN
INTRAMUSCULAR | Status: DC | PRN
  Administered 2017-11-11: 4 mg via INTRAVENOUS

## 2017-11-11 MED ORDER — PANTOPRAZOLE SODIUM 40 MG IV SOLR
40.0000 mg | Freq: Two times a day (BID) | INTRAVENOUS | Status: DC
  Administered 2017-11-11 – 2017-11-15 (×8): 40 mg via INTRAVENOUS
  Filled 2017-11-11 (×8): qty 40

## 2017-11-11 MED ORDER — ENOXAPARIN SODIUM 40 MG/0.4ML ~~LOC~~ SOLN
40.0000 mg | SUBCUTANEOUS | Status: DC
  Administered 2017-11-12 – 2017-11-14 (×3): 40 mg via SUBCUTANEOUS
  Filled 2017-11-11 (×4): qty 0.4

## 2017-11-11 MED ORDER — BUPIVACAINE HCL (PF) 0.25 % IJ SOLN
INTRAMUSCULAR | Status: AC
  Filled 2017-11-11: qty 30

## 2017-11-11 MED ORDER — MIDAZOLAM HCL 2 MG/2ML IJ SOLN
INTRAMUSCULAR | Status: DC | PRN
  Administered 2017-11-11: 2 mg via INTRAVENOUS

## 2017-11-11 MED ORDER — DEXAMETHASONE SODIUM PHOSPHATE 10 MG/ML IJ SOLN
INTRAMUSCULAR | Status: AC
  Filled 2017-11-11: qty 1

## 2017-11-11 MED ORDER — GABAPENTIN 600 MG PO TABS
300.0000 mg | ORAL_TABLET | Freq: Two times a day (BID) | ORAL | Status: DC
  Administered 2017-11-11 – 2017-11-15 (×8): 300 mg via ORAL
  Filled 2017-11-11 (×9): qty 1

## 2017-11-11 MED ORDER — LACTATED RINGERS IV SOLN
INTRAVENOUS | Status: DC
  Administered 2017-11-11 (×3): via INTRAVENOUS

## 2017-11-11 MED ORDER — FENTANYL CITRATE (PF) 100 MCG/2ML IJ SOLN: 25.0000 ug | INTRAMUSCULAR | Status: DC | PRN

## 2017-11-11 MED ORDER — BUPIVACAINE LIPOSOME 1.3 % IJ SUSP
INTRAMUSCULAR | Status: AC
  Filled 2017-11-11: qty 20

## 2017-11-11 MED ORDER — SUGAMMADEX SODIUM 200 MG/2ML IV SOLN
INTRAVENOUS | Status: DC | PRN
  Administered 2017-11-11: 190 mg via INTRAVENOUS

## 2017-11-11 MED ORDER — ONDANSETRON HCL 4 MG/2ML IJ SOLN: 4.0000 mg | Freq: Four times a day (QID) | INTRAMUSCULAR | Status: DC | PRN

## 2017-11-11 MED ORDER — MIDAZOLAM HCL 2 MG/2ML IJ SOLN
INTRAMUSCULAR | Status: AC
  Filled 2017-11-11: qty 2

## 2017-11-11 MED ORDER — FENTANYL CITRATE (PF) 100 MCG/2ML IJ SOLN
INTRAMUSCULAR | Status: DC | PRN
  Administered 2017-11-11 (×3): 50 ug via INTRAVENOUS

## 2017-11-11 MED ORDER — MORPHINE SULFATE (PF) 2 MG/ML IV SOLN
2.0000 mg | INTRAVENOUS | Status: DC | PRN
  Administered 2017-11-11 (×2): 2 mg via INTRAVENOUS
  Filled 2017-11-11 (×2): qty 1

## 2017-11-11 MED ORDER — CELECOXIB 200 MG PO CAPS
200.0000 mg | ORAL_CAPSULE | ORAL | Status: AC
Start: 2017-11-11 — End: 2017-11-11
  Administered 2017-11-11: 200 mg via ORAL

## 2017-11-11 MED ORDER — ROCURONIUM BROMIDE 50 MG/5ML IV SOLN
INTRAVENOUS | Status: AC
  Filled 2017-11-11: qty 1

## 2017-11-11 MED ORDER — CELECOXIB 200 MG PO CAPS
ORAL_CAPSULE | ORAL | Status: AC
  Administered 2017-11-11: 200 mg via ORAL
  Filled 2017-11-11: qty 1

## 2017-11-11 SURGICAL SUPPLY — 86 items
APPLIER CLIP 5 13 M/L LIGAMAX5 (MISCELLANEOUS)
BLADE SURG SZ10 CARB STEEL (BLADE) ×2 IMPLANT
BULB RESERV EVAC DRAIN JP 100C (MISCELLANEOUS) IMPLANT
CANISTER SUCT 1200ML W/VALVE (MISCELLANEOUS) ×2 IMPLANT
CATH ROBINSON RED A/P 16FR (CATHETERS) ×2 IMPLANT
CHLORAPREP W/TINT 26ML (MISCELLANEOUS) ×2 IMPLANT
CLIP APPLIE 5 13 M/L LIGAMAX5 (MISCELLANEOUS) IMPLANT
CNTNR SPEC 2.5X3XGRAD LEK (MISCELLANEOUS) ×1
CONT SPEC 4OZ STER OR WHT (MISCELLANEOUS) ×1
CONTAINER SPEC 2.5X3XGRAD LEK (MISCELLANEOUS) ×1 IMPLANT
DECANTER SPIKE VIAL GLASS SM (MISCELLANEOUS) ×2 IMPLANT
DEFOGGER SCOPE WARMER CLEARIFY (MISCELLANEOUS) ×2 IMPLANT
DERMABOND ADVANCED (GAUZE/BANDAGES/DRESSINGS) ×2
DERMABOND ADVANCED .7 DNX12 (GAUZE/BANDAGES/DRESSINGS) ×2 IMPLANT
DRAIN CHANNEL JP 19F (MISCELLANEOUS) IMPLANT
DRAPE INCISE IOBAN 66X45 STRL (DRAPES) ×2 IMPLANT
DRAPE LAPAROTOMY 100X77 ABD (DRAPES) ×2 IMPLANT
DRAPE LEGGINS SURG 28X43 STRL (DRAPES) ×4 IMPLANT
DRAPE UNDER BUTTOCK W/FLU (DRAPES) ×2 IMPLANT
ELECT BLADE 6.5 EXT (BLADE) ×2 IMPLANT
ELECT CAUTERY BLADE 6.4 (BLADE) ×2 IMPLANT
ELECT EZSTD 165MM 6.5IN (MISCELLANEOUS) ×2
ELECT REM PT RETURN 9FT ADLT (ELECTROSURGICAL) ×2
ELECTRODE EZSTD 165MM 6.5IN (MISCELLANEOUS) ×1 IMPLANT
ELECTRODE REM PT RTRN 9FT ADLT (ELECTROSURGICAL) ×1 IMPLANT
GAUZE SPONGE 4X4 12PLY STRL (GAUZE/BANDAGES/DRESSINGS) ×2 IMPLANT
GELPORT LAPAROSCOPIC (MISCELLANEOUS) ×2 IMPLANT
GLOVE BIO SURGEON STRL SZ7 (GLOVE) ×4 IMPLANT
GOWN STRL REUS W/ TWL LRG LVL3 (GOWN DISPOSABLE) ×5 IMPLANT
GOWN STRL REUS W/TWL LRG LVL3 (GOWN DISPOSABLE) ×5
HANDLE SUCTION POOLE (INSTRUMENTS) ×1 IMPLANT
HANDLE YANKAUER SUCT BULB TIP (MISCELLANEOUS) ×2 IMPLANT
HOLDER FOLEY CATH W/STRAP (MISCELLANEOUS) ×2 IMPLANT
IRRIGATION STRYKERFLOW (MISCELLANEOUS) ×1 IMPLANT
IRRIGATOR STRYKERFLOW (MISCELLANEOUS) ×2
IV NS 1000ML (IV SOLUTION) ×1
IV NS 1000ML BAXH (IV SOLUTION) ×1 IMPLANT
KIT PINK PAD W/HEAD ARE REST (MISCELLANEOUS) ×2
KIT PINK PAD W/HEAD ARM REST (MISCELLANEOUS) ×1 IMPLANT
KIT RM TURNOVER CYSTO AR (KITS) ×2 IMPLANT
KIT RM TURNOVER STRD PROC AR (KITS) ×2 IMPLANT
L-HOOK LAP DISP 36CM (ELECTROSURGICAL) ×2
LABEL OR SOLS (LABEL) ×2 IMPLANT
LHOOK LAP DISP 36CM (ELECTROSURGICAL) ×1 IMPLANT
LIGASURE IMPACT 36 18CM CVD LR (INSTRUMENTS) ×2 IMPLANT
NEEDLE HYPO 22GX1.5 SAFETY (NEEDLE) ×2 IMPLANT
NS IRRIG 1000ML POUR BTL (IV SOLUTION) ×2 IMPLANT
NS IRRIG 500ML POUR BTL (IV SOLUTION) ×4 IMPLANT
PACK BASIN MAJOR ARMC (MISCELLANEOUS) ×2 IMPLANT
PACK COLON CLEAN CLOSURE (MISCELLANEOUS) ×2 IMPLANT
PACK LAP CHOLECYSTECTOMY (MISCELLANEOUS) ×2 IMPLANT
PAD PREP 24X41 OB/GYN DISP (PERSONAL CARE ITEMS) ×2 IMPLANT
PENCIL ELECTRO HAND CTR (MISCELLANEOUS) ×2 IMPLANT
RELOAD STAPLER BLUE 60MM (STAPLE) ×11 IMPLANT
RELOAD STAPLER WHITE 60MM (STAPLE) IMPLANT
SCISSORS METZENBAUM CVD 33 (INSTRUMENTS) IMPLANT
SHEARS HARMONIC ACE PLUS 36CM (ENDOMECHANICALS) ×2 IMPLANT
SLEEVE ENDOPATH XCEL 5M (ENDOMECHANICALS) ×2 IMPLANT
SOL PREP PVP 2OZ (MISCELLANEOUS) ×2
SOLUTION PREP PVP 2OZ (MISCELLANEOUS) ×1 IMPLANT
SPONGE LAP 18X18 5 PK (GAUZE/BANDAGES/DRESSINGS) ×6 IMPLANT
STAPLE ECHEON FLEX 60 POW ENDO (STAPLE) ×4 IMPLANT
STAPLER CIRCULAR 29MM (STAPLE) ×2 IMPLANT
STAPLER CUT CVD 40MM BLUE (STAPLE) ×2 IMPLANT
STAPLER RELOAD BLUE 60MM (STAPLE) ×22
STAPLER RELOAD WHITE 60MM (STAPLE)
STAPLER SKIN PROX 35W (STAPLE) ×2 IMPLANT
SUCT SIGMOIDOSCOPE TIP 18 W/TU (SUCTIONS) IMPLANT
SUCTION POOLE HANDLE (INSTRUMENTS) ×2
SURGILUBE 2OZ TUBE FLIPTOP (MISCELLANEOUS) ×2 IMPLANT
SUT MNCRL AB 4-0 PS2 18 (SUTURE) ×4 IMPLANT
SUT PDS AB 0 CT1 27 (SUTURE) ×4 IMPLANT
SUT SILK 0 SH 30 (SUTURE) IMPLANT
SUT SILK 2 0 (SUTURE) ×1
SUT SILK 2 0SH CR/8 30 (SUTURE) ×2 IMPLANT
SUT SILK 2-0 18XBRD TIE 12 (SUTURE) ×1 IMPLANT
SUT VIC AB 3-0 SH 27 (SUTURE) ×1
SUT VIC AB 3-0 SH 27X BRD (SUTURE) ×1 IMPLANT
SYR 20CC LL (SYRINGE) ×2 IMPLANT
SYR 30ML LL (SYRINGE) ×4 IMPLANT
SYRINGE IRR TOOMEY STRL 70CC (SYRINGE) ×2 IMPLANT
TOWEL OR 17X26 4PK STRL BLUE (TOWEL DISPOSABLE) ×2 IMPLANT
TRAY FOLEY W/METER SILVER 16FR (SET/KITS/TRAYS/PACK) ×2 IMPLANT
TROCAR XCEL 12X100 BLDLESS (ENDOMECHANICALS) ×2 IMPLANT
TROCAR XCEL NON-BLD 5MMX100MML (ENDOMECHANICALS) ×2 IMPLANT
TUBING INSUF HEATED (TUBING) ×2 IMPLANT

## 2017-11-11 NOTE — Op Note (Addendum)
PROCEDURES: 1. Laparoscopic Low anterior resection 2. Laparoscopic takedown of splenic flexure 3. Small bowel resection  Pre-operative Diagnosis: Obstructing sigmoid colon cancer   Post-operative Diagnosis: Same  Surgeon: Marjory Lies Tonio Seider   Assistants: Dr. Windell Moment (required due to the complexity of the case,  EEA anastomosis)  Anesthesia: General endotracheal anesthesia  ASA Class: 2   Surgeon: Caroleen Hamman , MD FACS  Anesthesia: Gen. with endotracheal tube  Findings: Obstructing sigmoid colon mass with invasion into the ileum. SB Anastomosis 10 cms from ileocecal valve. No evidence of other metastatic disease  Estimated Blood Loss: 50cc         Drains: none         Specimens: SB and colon          Complications: none               Condition: stable  Procedure Details  The patient was seen again in the Holding Room. The benefits, complications, treatment options, and expected outcomes were discussed with the patient. The risks of bleeding, infection, recurrence of symptoms, failure to resolve symptoms,  bowel injury, any of which could require further surgery were reviewed with the patient.   The patient was taken to Operating Room, identified as Regina Patel and the procedure verified.  A Time Out was held and the above information confirmed.   Prior to the induction of general anesthesia, antibiotic prophylaxis was administered. VTE prophylaxis was in place. General endotracheal anesthesia was then administered and tolerated well. After the induction, the abdomen was prepped with Chloraprep and draped in the sterile fashion. The patient was positioned in lithotomy position.  7 cm incision was created as a midline mini laparotomy. The abdominal cavity was entered under direct visualization and the GelPort device was placed. A 5 mm port was placed in the suprapubic area under direct visualization and pneumoperitoneum was obtained. We also were able to place a 12 mm port in  the right lower quadrant and a 5 mm port in the left lower quadrant under direct visualization. There was significant adhesive disease in the pelvis from the sigmoid to the pelvic wall and also from the sigmoid to the ovary and the uterus. This adhesions were lysed with a combination of finger fracturing and Harmonic scalpel. The white line of pot was identified and divided and we mobilized the descending colon IN a lateral to medial fashion. We preserved the ureter at all times. We were also able to mobilize the splenic flexure using Harmonic scalpel in the standard fashion. We found cancer invading into the Ileum and therefore an en bloc resection was performed. Bowel resection performed after creating defect in the mesentery and dividing the proximal and distal ends w 60 mm blue load stapler.  A stapler side to side functional end to end anastomosis created w 60 mm stapler. Widely patent anastomosis w/o leak. We identified the takeoff of the inferior mesenteric artery dissected the pedicle and divided using a 60 mm vascular echelon stapler in the standard fashion. Using the Harmonic's scalpel were able to divide the mesorectum and and also divided proximal to the mesentery of the descending colon. Once we have an adequate visualization and mobilization we divided the sigmoid colon distal to the junction of the rectosigmoid area with multiple blue loads using the echelon stapler.  We mobilized the rectum from the peritoneal attachment to obtain adequate margins.  We'll remove the GelPort and visualized the colon in a direct fashion. An divided at the mid  descending colon with standard 60 mm blue load. We opened the stopped and measure the diameter of the bowel. A 29 mm dilator was perfect size. A pursestring was used after in certain the anvil device. Dr. Peyton Patel was able to pass a 29 mm standard EEA stapler device through the anus . Under direct visualization we perform an end to end anastomosis with the  EEA device. A leak test was performed inflating the colon with a Toomey syringe and a rubber catheter. No evidence of leak was observed. There was also adequate hemostasis. A 19 Blake drain was placed in the pelvis. We were able to mobilize the omentum and I created an omental flap to attach it to the anastomosis. . All the laparoscopic ports were removed and a second look showed no evidence of any bleeding or any other injuries. We changed gloves and place a new tray to close the abdomen with a 0 PDS suture in a running fashion and the skin was closed with 4-0 Monocryl. Liposomal Marcaine was injected on all incision sites under direct visualization. Dermabond was used to coat all the skin incisions. Needle and laparotomy count were correct and there were no immediate occasions  Caroleen Hamman, MD, FACS

## 2017-11-11 NOTE — Interval H&P Note (Signed)
History and Physical Interval Note:  11/11/2017 9:33 AM  Regina Patel  has presented today for surgery, with the diagnosis of Almedia  The various methods of treatment have been discussed with the patient and family. After consideration of risks, benefits and other options for treatment, the patient has consented to  Procedure(s): LAPAROSCOPIC SIGMOID COLECTOMY (N/A) COLOSTOMY (N/A) PARTIAL COLECTOMY (N/A) as a surgical intervention .  The patient's history has been reviewed, patient examined, no change in status, stable for surgery.  I have reviewed the patient's chart and labs.  Questions were answered to the patient's satisfaction.     Regina Patel

## 2017-11-11 NOTE — Transfer of Care (Signed)
Immediate Anesthesia Transfer of Care Note  Patient: Regina Patel  Procedure(s) Performed: LAPAROSCOPIC SIGMOID COLECTOMY (N/A ) SMALL BOWEL RESECTION (N/A )  Patient Location: PACU  Anesthesia Type:General  Level of Consciousness: drowsy and patient cooperative  Airway & Oxygen Therapy: Patient Spontanous Breathing and Patient connected to face mask oxygen  Post-op Assessment: Report given to RN, Post -op Vital signs reviewed and stable and Patient moving all extremities X 4  Post vital signs: Reviewed and stable  Last Vitals:  Vitals:   11/11/17 0752 11/11/17 1421  BP: 124/70 99/63  Pulse: 100 (!) 27  Resp: 18 11  Temp: 36.6 C 37.2 C  SpO2: 100% 100%    Last Pain:  Vitals:   11/11/17 0752  TempSrc: Tympanic  PainSc: 0-No pain         Complications: No apparent anesthesia complications

## 2017-11-11 NOTE — Anesthesia Preprocedure Evaluation (Signed)
Anesthesia Evaluation  Patient identified by MRN, date of birth, ID band Patient awake    Reviewed: Allergy & Precautions, NPO status , Patient's Chart, lab work & pertinent test results  Airway Mallampati: III  TM Distance: <3 FB     Dental  (+) Chipped, Caps   Pulmonary neg pulmonary ROS,    Pulmonary exam normal        Cardiovascular negative cardio ROS Normal cardiovascular exam+ Valvular Problems/Murmurs      Neuro/Psych  Headaches, negative psych ROS   GI/Hepatic Neg liver ROS, diverticulosis   Endo/Other  Hypothyroidism   Renal/GU negative Renal ROS  negative genitourinary   Musculoskeletal negative musculoskeletal ROS (+)   Abdominal Normal abdominal exam  (+)   Peds  Hematology  (+) anemia ,   Anesthesia Other Findings   Reproductive/Obstetrics negative OB ROS                             Anesthesia Physical Anesthesia Plan  ASA: II  Anesthesia Plan: General   Post-op Pain Management:    Induction: Intravenous  PONV Risk Score and Plan:   Airway Management Planned: Oral ETT  Additional Equipment:   Intra-op Plan:   Post-operative Plan: Extubation in OR  Informed Consent: I have reviewed the patients History and Physical, chart, labs and discussed the procedure including the risks, benefits and alternatives for the proposed anesthesia with the patient or authorized representative who has indicated his/her understanding and acceptance.   Dental advisory given  Plan Discussed with: CRNA and Surgeon  Anesthesia Plan Comments:         Anesthesia Quick Evaluation

## 2017-11-11 NOTE — Anesthesia Post-op Follow-up Note (Signed)
Anesthesia QCDR form completed.        

## 2017-11-11 NOTE — Anesthesia Procedure Notes (Addendum)
Procedure Name: Intubation Date/Time: 11/11/2017 10:30 AM Performed by: Allean Found, CRNA Pre-anesthesia Checklist: Emergency Drugs available, Suction available, Patient identified, Patient being monitored and Timeout performed Patient Re-evaluated:Patient Re-evaluated prior to induction Oxygen Delivery Method: Circle system utilized and Simple face mask Preoxygenation: Pre-oxygenation with 100% oxygen Induction Type: IV induction Ventilation: Mask ventilation without difficulty Laryngoscope Size: Mac and 3 Grade View: Grade I Tube type: Oral Tube size: 7.0 mm Number of attempts: 1 Airway Equipment and Method: Stylet Placement Confirmation: ETT inserted through vocal cords under direct vision,  positive ETCO2 and breath sounds checked- equal and bilateral Secured at: 21 cm Tube secured with: Tape Dental Injury: Teeth and Oropharynx as per pre-operative assessment

## 2017-11-11 NOTE — Progress Notes (Signed)
  Patient visited with this evening.  Patient states that her abdomen and back are sore.  She has been tolerating clear liquids without nausea but states she has no appetite.  The dressing to her abdomen is clean, dry, intact and was not taken down.  Had a long conversation about the anticipated recovery from the surgery.  Discussed the importance of getting out of bed to chair even this evening.  All questions answered to the patient's satisfaction.  Clayburn Pert, MD [facility]  Day [facility] [phone] Night [facility] [phone]

## 2017-11-12 LAB — BASIC METABOLIC PANEL
Anion gap: 5 (ref 5–15)
BUN: 6 mg/dL (ref 6–20)
CHLORIDE: 106 mmol/L (ref 101–111)
CO2: 26 mmol/L (ref 22–32)
Calcium: 8.5 mg/dL — ABNORMAL LOW (ref 8.9–10.3)
Creatinine, Ser: 0.62 mg/dL (ref 0.44–1.00)
GFR calc Af Amer: >60 mL/min (ref >60)
GFR calc non Af Amer: >60 mL/min (ref >60)
GLUCOSE: 155 mg/dL — AB (ref 65–99)
POTASSIUM: 4 mmol/L (ref 3.5–5.1)
SODIUM: 137 mmol/L (ref 135–145)

## 2017-11-12 LAB — CBC
HEMATOCRIT: 29.5 % — AB (ref 35.0–47.0)
HEMOGLOBIN: 9.5 g/dL — AB (ref 12.0–16.0)
MCH: 26.3 pg (ref 26.0–34.0)
MCHC: 32.4 g/dL (ref 32.0–36.0)
MCV: 81.3 fL (ref 80.0–100.0)
Platelets: 259 10*3/uL (ref 150–440)
RBC: 3.63 MIL/uL — ABNORMAL LOW (ref 3.80–5.20)
RDW: 20.5 % — ABNORMAL HIGH (ref 11.5–14.5)
WBC: 8.5 10*3/uL (ref 3.6–11.0)

## 2017-11-12 LAB — MAGNESIUM: MAGNESIUM: 1.8 mg/dL (ref 1.7–2.4)

## 2017-11-12 LAB — HIV ANTIBODY (ROUTINE TESTING W REFLEX): HIV Screen 4th Generation wRfx: NONREACTIVE

## 2017-11-12 NOTE — Progress Notes (Signed)
POD # 1 Doing well Some soreness No Emesis Taking clears Good UO AVSS  PE NAD Abd: dec bs, soft, incisions c/d/i. No infection or peritonitis  A/P Doing well mobilize DC foley in am ( low pelvic surgery)

## 2017-11-13 LAB — BASIC METABOLIC PANEL
ANION GAP: 3 — AB (ref 5–15)
BUN: 8 mg/dL (ref 6–20)
CHLORIDE: 108 mmol/L (ref 101–111)
CO2: 27 mmol/L (ref 22–32)
Calcium: 8.2 mg/dL — ABNORMAL LOW (ref 8.9–10.3)
Creatinine, Ser: 0.7 mg/dL (ref 0.44–1.00)
Glucose, Bld: 95 mg/dL (ref 65–99)
POTASSIUM: 3.8 mmol/L (ref 3.5–5.1)
SODIUM: 138 mmol/L (ref 135–145)

## 2017-11-13 MED ORDER — LEVOTHYROXINE SODIUM 50 MCG PO TABS
150.0000 ug | ORAL_TABLET | Freq: Every day | ORAL | Status: DC
  Administered 2017-11-13 – 2017-11-15 (×3): 150 ug via ORAL
  Filled 2017-11-13 (×3): qty 1

## 2017-11-13 NOTE — Progress Notes (Signed)
POD # 2 Doing well Minimal pain No Emesis Taking clears Good UO AVSS  PE NAD Abd: dec bs, soft, incisions c/d/i. No infection or peritonitis  A/P Doing well Keep clears DC foley mobilize

## 2017-11-14 ENCOUNTER — Telehealth: Payer: Self-pay | Admitting: General Practice

## 2017-11-14 ENCOUNTER — Encounter: Payer: Self-pay | Admitting: Surgery

## 2017-11-14 ENCOUNTER — Other Ambulatory Visit: Payer: Self-pay | Admitting: Pathology

## 2017-11-14 LAB — CBC
HEMATOCRIT: 24.8 % — AB (ref 35.0–47.0)
Hemoglobin: 8 g/dL — ABNORMAL LOW (ref 12.0–16.0)
MCH: 26.5 pg (ref 26.0–34.0)
MCHC: 32.4 g/dL (ref 32.0–36.0)
MCV: 81.6 fL (ref 80.0–100.0)
PLATELETS: 192 10*3/uL (ref 150–440)
RBC: 3.04 MIL/uL — ABNORMAL LOW (ref 3.80–5.20)
RDW: 20.2 % — AB (ref 11.5–14.5)
WBC: 3.7 10*3/uL (ref 3.6–11.0)

## 2017-11-14 LAB — BASIC METABOLIC PANEL
Anion gap: 6 (ref 5–15)
BUN: 8 mg/dL (ref 6–20)
CO2: 26 mmol/L (ref 22–32)
Calcium: 8.2 mg/dL — ABNORMAL LOW (ref 8.9–10.3)
Chloride: 107 mmol/L (ref 101–111)
Creatinine, Ser: 0.72 mg/dL (ref 0.44–1.00)
GFR calc Af Amer: >60 mL/min (ref >60)
Glucose, Bld: 82 mg/dL (ref 65–99)
POTASSIUM: 3.5 mmol/L (ref 3.5–5.1)
SODIUM: 139 mmol/L (ref 135–145)

## 2017-11-14 MED ORDER — INSULIN ASPART 100 UNIT/ML ~~LOC~~ SOLN: 14.0000 [IU] | Freq: Once | SUBCUTANEOUS | Status: DC

## 2017-11-14 MED ORDER — OXYCODONE HCL 5 MG PO TABS: 5.0000 mg | ORAL_TABLET | ORAL | 0 refills | Status: DC | PRN

## 2017-11-14 NOTE — Progress Notes (Addendum)
[facility] Day(s): 3.   Post op day(s): 3 Days Post-Op.   Interval History: Patient seen and examined, no acute events or new complaints overnight. Patient reports she feels well with minimal peri-incisional pain and has been ambulating many times around the halls and tolerating full liquids diet with +flatus and +formed BM's without difficulty and without N/V, fever/chills, CP, or SOB. She otherwise requests regular food.  Review of Systems:  Constitutional: denies fever, chills  HEENT: denies cough or congestion  Respiratory: denies any shortness of breath  Cardiovascular: denies chest pain or palpitations  Gastrointestinal: abdominal pain, N/V, and bowel function as per interval history Genitourinary: denies burning with urination or urinary frequency Musculoskeletal: denies pain, decreased motor or sensation Integumentary: denies any other rashes or skin discolorations except post-surgical abdominal wounds Neurological: denies HA or vision/hearing changes   Vital signs in last 24 hours: [min-max] current  Temp:  [98 F (36.7 C)-98.4 F (36.9 C)] 98.3 F (36.8 C) (11/26 0540) Pulse Rate:  [69-85] 69 (11/26 0540) Resp:  [17-18] 17 (11/26 0540) BP: (117-133)/(66-69) 117/66 (11/26 0540) SpO2:  [97 %-100 %] 97 % (11/26 0540)     Height: 5\' 7"  (170.2 cm) Weight: 205 lb (93 kg) BMI (Calculated): 32.1   Intake/Output this shift:  No intake/output data recorded.   Intake/Output last 2 shifts:  @IOLAST2SHIFTS @   Physical Exam:  Constitutional: alert, cooperative and no distress  HENT: normocephalic without obvious abnormality  Eyes: PERRL, EOM's grossly intact and symmetric  Neuro: CN II - XII grossly intact and symmetric without deficit  Respiratory: breathing non-labored at rest  Cardiovascular: regular rate and sinus rhythm  Gastrointestinal: soft and non-distended with minimal peri-incisional tenderness to palpation, incisions well-approximated  without erythema or drainage Musculoskeletal: UE and LE FROM, no edema or wounds, motor and sensation grossly intact, NT   Labs:  CBC Latest Ref Rng & Units 11/14/2017 11/12/2017 11/11/2017  WBC 3.6 - 11.0 K/uL 3.7 8.5 10.5  Hemoglobin 12.0 - 16.0 g/dL 8.0(L) 9.5(L) 11.3(L)  Hematocrit 35.0 - 47.0 % 24.8(L) 29.5(L) 34.4(L)  Platelets 150 - 440 K/uL 192 259 279   CMP Latest Ref Rng & Units 11/14/2017 11/13/2017 11/12/2017  Glucose 65 - 99 mg/dL 82 95 155(H)  BUN 6 - 20 mg/dL 8 8 6   Creatinine 0.44 - 1.00 mg/dL 0.72 0.70 0.62  Sodium 135 - 145 mmol/L 139 138 137  Potassium 3.5 - 5.1 mmol/L 3.5 3.8 4.0  Chloride 101 - 111 mmol/L 107 108 106  CO2 22 - 32 mmol/L 26 27 26   Calcium 8.9 - 10.3 mg/dL 8.2(L) 8.2(L) 8.5(L)  Total Protein 6.5 - 8.1 g/dL - - -  Total Bilirubin 0.3 - 1.2 mg/dL - - -  Alkaline Phos 38 - 126 U/L - - -  AST 15 - 41 U/L - - -  ALT 14 - 54 U/L - - -   Imaging studies: No new pertinent imaging studies   Assessment/Plan: (ICD-10's: C18.7) 46 y.o. 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    - advanced to soft diet  - follow-up pending pathology  - pain control prn (minimize narcotics)  - medical management of comorbidities (home PO meds)   - DVT prophylaxis,  ambulation encouraged  - check/trend morning hemoglobin level  - anticipate discharge tomorrow  All of the above findings and recommendations were discussed with the patient and patient's family, and all of patient's and her family's questions were answered to their expressed satisfaction.  -- Marilynne Drivers Rosana Hoes, MD, [place]: [facility] - Partnering for exceptional care. Office: [phone]

## 2017-11-14 NOTE — Anesthesia Postprocedure Evaluation (Signed)
Anesthesia Post Note  Patient: Regina Patel  Procedure(s) Performed: LAPAROSCOPIC SIGMOID COLECTOMY (N/A ) SMALL BOWEL RESECTION (N/A )  Patient location during evaluation: PACU Anesthesia Type: General Level of consciousness: awake and alert and oriented Pain management: pain level controlled Vital Signs Assessment: post-procedure vital signs reviewed and stable Respiratory status: spontaneous breathing Cardiovascular status: blood pressure returned to baseline Anesthetic complications: no     Last Vitals:  Vitals:   11/13/17 2004 11/14/17 0540  BP: 133/68 117/66  Pulse: 85 69  Resp: 18 17  Temp: 36.7 C 36.8 C  SpO2: 97% 97%    Last Pain:  Vitals:   11/14/17 0540  TempSrc: Oral  PainSc:                  Demetruis Depaul

## 2017-11-14 NOTE — Telephone Encounter (Signed)
Call made to patient at this time. I left a message for her regarding the message left by her daughter on Thursday. I stated that if there are any questions or concerns that we can address to give [place] a call and let us know.

## 2017-11-14 NOTE — Telephone Encounter (Signed)
Patients daughter called and left a message on Thursday at 3:33pm asking for one of the nurses to give a call back due to mother was suppose to have surgery done on Friday. Please call patient and advice.

## 2017-11-15 LAB — CBC
HEMATOCRIT: 25.7 % — AB (ref 35.0–47.0)
HEMOGLOBIN: 8.1 g/dL — AB (ref 12.0–16.0)
MCH: 25.5 pg — ABNORMAL LOW (ref 26.0–34.0)
MCHC: 31.5 g/dL — ABNORMAL LOW (ref 32.0–36.0)
MCV: 80.9 fL (ref 80.0–100.0)
PLATELETS: 205 10*3/uL (ref 150–440)
RBC: 3.18 MIL/uL — ABNORMAL LOW (ref 3.80–5.20)
RDW: 20.1 % — AB (ref 11.5–14.5)
WBC: 3.6 10*3/uL (ref 3.6–11.0)

## 2017-11-15 LAB — SURGICAL PATHOLOGY

## 2017-11-15 NOTE — Progress Notes (Signed)
Discharge instruction given to patient, patient voiced no concerns and verbalized understanding. All patient belongings gathered and patient transported home by family.

## 2017-11-17 ENCOUNTER — Ambulatory Visit: Payer: Managed Care, Other (non HMO) | Admitting: Surgery

## 2017-11-17 NOTE — Addendum Note (Signed)
Addended by: Caroleen Hamman F on: 11/17/2017 02:32 PM   Modules accepted: Orders, SmartSet

## 2017-11-17 NOTE — Discharge Summary (Signed)
Physician Discharge Summary  Patient ID: Regina Patel MRN: [id number] DOB/AGE: [birth year] 46 y.o.  Admit date: 11/11/2017 Discharge date: 11/17/2017  Admission Diagnoses:  Discharge Diagnoses:  Active Problems:   Cancer of sigmoid ([place])   S/P laparoscopic colectomy   Discharged Condition: good  Hospital Course: 46 y.o. female presented to [facility] for semi-elective laparoscopic LAR due to obstructing circumferential sigmoid colon mass visualized on flexible sigmoidoscopy following recurrent sigmoid colonic diverticulitis treated with prolonged antibiotic therapy. Informed consent was obtained and documented, and patient underwent laparoscopic LAR with takedown of splenic flexure and en-block small bowel resection ([place], 11/08/2017).  Post-operatively, patient's pain and bowel function improved and advancement of patient's diet and ambulation were well-tolerated. The remainder of patient's hospital course was essentially unremarkable, and discharge planning was initiated accordingly with patient safely able to be discharged home with appropriate discharge instructions, pain control, and outpatient post-operative surgical follow-up after all of her and her family's questions were answered to their expressed satisfaction.  Consults: None  Treatments: surgery: Laparoscopic LAR with takedown of splenic flexure and en-block small bowel resection ([place], 11/08/2017)  Discharge Exam: Blood pressure 109/68, pulse 67, temperature 98.2 F (36.8 C), temperature source Oral, resp. rate 16, height 5\' 7"  (1.702 m), weight 205 lb (93 kg), SpO2 100 %. General appearance: alert, cooperative and no distress GI: abdomen soft and non-distended with minimal peri-incisional tenderness to palpation, incisions well-approximated with no surrounding erythema or drainage  Disposition: 01-Home or Self Care   Allergies as of 11/15/2017   No Known Allergies     Medication List    STOP taking these medications    amoxicillin-clavulanate 875-125 MG tablet Commonly known as:  AUGMENTIN     TAKE these medications   APPLE CIDER VINEGAR PO Take 1 tablet by mouth daily.   Biotin 10 MG Tabs Take 2 tablets by mouth daily.   bisacodyl 5 MG EC tablet Commonly known as:  DULCOLAX Take 1 tablet (5 mg total) by mouth daily as needed for moderate constipation.   dicyclomine 10 MG capsule Commonly known as:  BENTYL Take 1 capsule (10 mg total) by mouth 4 (four) times daily -  before meals and at bedtime.   erythromycin base 500 MG tablet Commonly known as:  E-MYCIN Please see bowel prep instructions   IRON (IRON) Take 325 mg by mouth 2 (two) times daily.   levothyroxine 175 MCG tablet Commonly known as:  SYNTHROID, LEVOTHROID Take 1 tablet (175 mcg total) by mouth daily before breakfast.   Multiple Vitamin tablet Take 2 tablets by mouth daily.   neomycin 500 MG tablet Commonly known as:  MYCIFRADIN Please see bowel prep instructions   OMEGA-3 FATTY ACIDS PO Take 2 capsules by mouth daily.   oxyCODONE 5 MG immediate release tablet Commonly known as:  Oxy IR/ROXICODONE Take 1-2 tablets (5-10 mg total) by mouth every 4 (four) hours as needed for severe pain.   polyethylene glycol powder powder Commonly known as:  GLYCOLAX/MIRALAX Please see bowel prep instructions   VITAMIN D (CHOLECALCIFEROL) PO Take 1 tablet by mouth daily.     ASK your doctor about these medications   cyanocobalamin 1000 MCG/ML injection Commonly known as:  (VITAMIN B-12) Inject 1 mL (1,000 mcg total) once for 1 dose into the muscle. Ask about: Should I take this medication?      Follow-up Information    Jules Husbands, MD. Go on 11/30/2017.   Specialty:  General Surgery Why:  Wednesday at 1:30pm for hospital follow-up Contact  information: [address] [place] [address] [phone]           Signed: Vickie Epley 11/17/2017, 10:56 AM

## 2017-11-21 ENCOUNTER — Ambulatory Visit: Payer: Managed Care, Other (non HMO)

## 2017-11-22 ENCOUNTER — Telehealth: Payer: Self-pay | Admitting: Oncology

## 2017-11-22 ENCOUNTER — Ambulatory Visit (INDEPENDENT_AMBULATORY_CARE_PROVIDER_SITE_OTHER): Payer: Managed Care, Other (non HMO)

## 2017-11-22 DIAGNOSIS — E538 Deficiency of other specified B group vitamins: Secondary | ICD-10-CM

## 2017-11-22 MED ORDER — CYANOCOBALAMIN 1000 MCG/ML IJ SOLN
1000.0000 ug | Freq: Once | INTRAMUSCULAR | Status: AC
  Administered 2017-11-22: 1000 ug via INTRAMUSCULAR

## 2017-11-22 NOTE — Telephone Encounter (Signed)
Rschd from Dr Mike Gip to Dr Janese Banks.... Dbl book at 8:30 a.m, per 11/22/17 staff msg via Beverlee Nims T/Dr Janese Banks. Patient is aware.

## 2017-11-25 ENCOUNTER — Telehealth: Payer: Self-pay | Admitting: Oncology

## 2017-11-25 ENCOUNTER — Other Ambulatory Visit: Payer: Self-pay

## 2017-11-25 ENCOUNTER — Inpatient Hospital Stay: Payer: Managed Care, Other (non HMO)

## 2017-11-25 ENCOUNTER — Inpatient Hospital Stay: Payer: Managed Care, Other (non HMO) | Attending: Hematology and Oncology | Admitting: Oncology

## 2017-11-25 ENCOUNTER — Encounter: Payer: Self-pay | Admitting: Oncology

## 2017-11-25 VITALS — BP 141/83 | HR 98 | Temp 98.1°F | Resp 18 | Ht 67.32 in | Wt 199.9 lb

## 2017-11-25 DIAGNOSIS — E669 Obesity, unspecified: Secondary | ICD-10-CM | POA: Diagnosis not present

## 2017-11-25 DIAGNOSIS — Z7189 Other specified counseling: Secondary | ICD-10-CM

## 2017-11-25 DIAGNOSIS — Z79899 Other long term (current) drug therapy: Secondary | ICD-10-CM | POA: Insufficient documentation

## 2017-11-25 DIAGNOSIS — E039 Hypothyroidism, unspecified: Secondary | ICD-10-CM | POA: Insufficient documentation

## 2017-11-25 DIAGNOSIS — C187 Malignant neoplasm of sigmoid colon: Secondary | ICD-10-CM | POA: Diagnosis not present

## 2017-11-25 DIAGNOSIS — E559 Vitamin D deficiency, unspecified: Secondary | ICD-10-CM | POA: Insufficient documentation

## 2017-11-25 DIAGNOSIS — Z9049 Acquired absence of other specified parts of digestive tract: Secondary | ICD-10-CM | POA: Diagnosis not present

## 2017-11-25 DIAGNOSIS — K5900 Constipation, unspecified: Secondary | ICD-10-CM | POA: Insufficient documentation

## 2017-11-25 DIAGNOSIS — Z90710 Acquired absence of both cervix and uterus: Secondary | ICD-10-CM | POA: Diagnosis not present

## 2017-11-25 DIAGNOSIS — D500 Iron deficiency anemia secondary to blood loss (chronic): Secondary | ICD-10-CM | POA: Insufficient documentation

## 2017-11-25 DIAGNOSIS — D649 Anemia, unspecified: Secondary | ICD-10-CM | POA: Diagnosis not present

## 2017-11-25 DIAGNOSIS — K921 Melena: Secondary | ICD-10-CM | POA: Insufficient documentation

## 2017-11-25 LAB — CBC WITH DIFFERENTIAL/PLATELET
Basophils Absolute: 0 10*3/uL (ref 0–0.1)
Basophils Relative: 0 %
EOS ABS: 0.1 10*3/uL (ref 0–0.7)
EOS PCT: 1 %
HCT: 31.7 % — ABNORMAL LOW (ref 35.0–47.0)
Hemoglobin: 10.1 g/dL — ABNORMAL LOW (ref 12.0–16.0)
LYMPHS ABS: 1.6 10*3/uL (ref 1.0–3.6)
LYMPHS PCT: 29 %
MCH: 25.9 pg — ABNORMAL LOW (ref 26.0–34.0)
MCHC: 31.8 g/dL — AB (ref 32.0–36.0)
MCV: 81.5 fL (ref 80.0–100.0)
MONO ABS: 0.4 10*3/uL (ref 0.2–0.9)
MONOS PCT: 7 %
Neutro Abs: 3.5 10*3/uL (ref 1.4–6.5)
Neutrophils Relative %: 63 %
PLATELETS: 271 10*3/uL (ref 150–440)
RBC: 3.89 MIL/uL (ref 3.80–5.20)
RDW: 20 % — AB (ref 11.5–14.5)
WBC: 5.5 10*3/uL (ref 3.6–11.0)

## 2017-11-25 LAB — FOLATE: FOLATE: 20.5 ng/mL (ref >5.9)

## 2017-11-25 LAB — IRON AND TIBC
IRON: 14 ug/dL — AB (ref 28–170)
SATURATION RATIOS: 3 % — AB (ref 10.4–31.8)
TIBC: 418 ug/dL (ref 250–450)
UIBC: 405 ug/dL

## 2017-11-25 LAB — VITAMIN B12: Vitamin B-12: 433 pg/mL (ref 180–914)

## 2017-11-25 LAB — FERRITIN: Ferritin: 14 ng/mL (ref 11–307)

## 2017-11-25 MED ORDER — LIDOCAINE-PRILOCAINE 2.5-2.5 % EX CREA: TOPICAL_CREAM | CUTANEOUS | 3 refills | Status: DC

## 2017-11-25 MED ORDER — PROCHLORPERAZINE MALEATE 10 MG PO TABS: 10.0000 mg | ORAL_TABLET | Freq: Four times a day (QID) | ORAL | 1 refills | Status: DC | PRN

## 2017-11-25 MED ORDER — LORAZEPAM 0.5 MG PO TABS: 0.5000 mg | ORAL_TABLET | Freq: Four times a day (QID) | ORAL | 0 refills | Status: DC | PRN

## 2017-11-25 MED ORDER — DEXAMETHASONE 4 MG PO TABS: 8.0000 mg | ORAL_TABLET | Freq: Every day | ORAL | 1 refills | Status: DC

## 2017-11-25 MED ORDER — ONDANSETRON HCL 8 MG PO TABS: 8.0000 mg | ORAL_TABLET | Freq: Two times a day (BID) | ORAL | 1 refills | Status: DC | PRN

## 2017-11-25 NOTE — Progress Notes (Signed)
Hematology/Oncology Consult note [facility] [phone] Fax:([phone]) [phone]  Patient Care Team: Mikey College, NP as PCP - General (Nurse Practitioner)   Name of the patient: Regina Patel  [id number]  [birth year]    Reason for referral- colon cancer   Referring physician- Dr. Dahlia Byes  Date of visit: 11/25/17   History of presenting illness-patient is a 46 year old female who was seen by Dr. Vicente Males from GI for recurrent episodes of diverticulitis since august 2018 which was treated with antibiotics.  However patient continued to have some abdominal cramping along with some bleeding in her stool and constipation alternating with diarrhea.  Patient underwent flexible sigmoidoscopy on 11/07/2017.  A circumferential infiltrative completely obstructing large mass was found in the rectosigmoid colon.  Biopsy showed at least high-grade dysplasia.  Patient was referred to Dr. Dahlia Byes for consideration of hemicolectomy.  Patient underwent laparoscopic low anterior resection along with  small bowel resection and laparoscopic takedown of the splenic flexure on 11/11/2017.  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  Tumor size was 3 cm, moderately differentiated.  All margins were negative for invasive carcinoma.  Perineural invasion and lymphovascular invasion was present.  1 out of 17 lymph nodes was positive for malignancy.  Pathologic stage was pT4b pN1a. MSI stable  Preoperative CEA is not available.  CT abdomen on 10/26/2017 showed residual changes of diverticulitis although improved when compared to prior exam.  No perforation or abscess is identified.  No family history of colon cancer.  History of breast cancer in her mother in her 17s.  Currently patient reports doing well.  Her pain is well controlled and she denies any complaints  ECOG PS- 0  Pain scale- 0   Review of systems- Review of Systems    Constitutional: Negative for chills, fever, malaise/fatigue and weight loss.  HENT: Negative for congestion, ear discharge and nosebleeds.   Eyes: Negative for blurred vision.  Respiratory: Negative for cough, hemoptysis, sputum production, shortness of breath and wheezing.   Cardiovascular: Negative for chest pain, palpitations, orthopnea and claudication.  Gastrointestinal: Negative for abdominal pain, blood in stool, constipation, diarrhea, heartburn, melena, nausea and vomiting.  Genitourinary: Negative for dysuria, flank pain, frequency, hematuria and urgency.  Musculoskeletal: Negative for back pain, joint pain and myalgias.  Skin: Negative for rash.  Neurological: Negative for dizziness, tingling, focal weakness, seizures, weakness and headaches.  Endo/Heme/Allergies: Does not bruise/bleed easily.  Psychiatric/Behavioral: Negative for depression and suicidal ideas. The patient does not have insomnia.     No Known Allergies  Patient Active Problem List   Diagnosis Date Noted  . S/P laparoscopic colectomy 11/11/2017  . Cancer of sigmoid ([place])   . Abscess of sigmoid colon   . Diverticulitis of large intestine with abscess 07/23/2017  . Obesity 04/22/2016  . 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 Hypothyroidism 10/02/2015  . Status post abdominal hysterectomy and right salpingo-oophorectomy 10/02/2015  . Abdominal bloating 05/03/2015  . 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 Multiple benign melanocytic nevi 05/03/2015  . Avitaminosis D 05/03/2015     Past Medical History:  Diagnosis Date  . Anemia   . Diverticulosis   . Headache   . Heart murmur   . Hyperlipidemia   . Hypothyroidism   . Thyroid disease      Past Surgical History:  Procedure Laterality Date  . APPENDECTOMY     possible during hysterectomy  . BOWEL RESECTION N/A 11/11/2017  Procedure: SMALL BOWEL RESECTION;  Surgeon: Jules Husbands, MD;  Location:  [facility] ORS;  Service: General;  Laterality: N/A;  . FLEXIBLE SIGMOIDOSCOPY N/A 11/07/2017   Procedure: Beryle Quant;  Surgeon: Jonathon Bellows, MD;  Location: [facility];  Service: Gastroenterology;  Laterality: N/A;  . LAPAROSCOPIC SIGMOID COLECTOMY N/A 11/11/2017   Procedure: LAPAROSCOPIC SIGMOID COLECTOMY;  Surgeon: Jules Husbands, MD;  Location: [facility] ORS;  Service: General;  Laterality: N/A;  . MOUTH SURGERY    . TOTAL ABDOMINAL HYSTERECTOMY  05/20/2014   ooperectomy unilateral    Social History   Socioeconomic History  . Marital status: Married    Spouse name: Not on file  . Number of children: Not on file  . Years of education: Not on file  . Highest education level: Not on file  Social Needs  . Financial resource strain: Not on file  . Food insecurity - worry: Not on file  . Food insecurity - inability: Not on file  . Transportation needs - medical: Not on file  . Transportation needs - non-medical: Not on file  Occupational History  . Not on file  Tobacco Use  . Smoking status: Never Smoker  . Smokeless tobacco: Never Used  Substance and Sexual Activity  . Alcohol use: No    Alcohol/week: 0.0 oz  . Drug use: No  . Sexual activity: Yes  Other Topics Concern  . Not on file  Social History Narrative  . Not on file     Family History  Problem Relation Age of Onset  . Breast cancer Mother 43  . Hypertension Mother   . Thyroid disease Brother   . Hernia Brother      Current Outpatient Medications:  .  APPLE CIDER VINEGAR PO, Take 1 tablet by mouth daily., Disp: , Rfl:  .  Biotin 10 MG TABS, Take 2 tablets by mouth daily. , Disp: , Rfl:  .  cyanocobalamin (,VITAMIN B-12,) 1000 MCG/ML injection, , Disp: , Rfl:  .  dicyclomine (BENTYL) 10 MG capsule, Take 1 capsule (10 mg total) by mouth 4 (four) times daily -  before meals and at bedtime., Disp: 90 capsule, Rfl: 0 .  IRON, IRON,, Take 325 mg by mouth 2 (two) times daily. , Disp: , Rfl:  .  levothyroxine  (SYNTHROID, LEVOTHROID) 175 MCG tablet, Take 1 tablet (175 mcg total) by mouth daily before breakfast., Disp: 30 tablet, Rfl: 11 .  Multiple Vitamin tablet, Take 2 tablets by mouth daily. , Disp: , Rfl:  .  OMEGA-3 FATTY ACIDS PO, Take 2 capsules by mouth daily. , Disp: , Rfl:  .  VITAMIN D, CHOLECALCIFEROL, PO, Take 1 tablet by mouth daily., Disp: , Rfl:  .  bisacodyl (DULCOLAX) 5 MG EC tablet, Take 1 tablet (5 mg total) by mouth daily as needed for moderate constipation. (Patient not taking: Reported on 11/25/2017), Disp: 4 tablet, Rfl: 0 .  oxyCODONE (OXY IR/ROXICODONE) 5 MG immediate release tablet, Take 1-2 tablets (5-10 mg total) by mouth every 4 (four) hours as needed for severe pain. (Patient not taking: Reported on 11/25/2017), Disp: 30 tablet, Rfl: 0 .  polyethylene glycol powder (GLYCOLAX/MIRALAX) powder, Please see bowel prep instructions (Patient not taking: Reported on 11/25/2017), Disp: 255 g, Rfl: 0   Physical exam:  Vitals:   11/25/17 0834  BP: (!) 141/83  Pulse: 98  Resp: 18  Temp: 98.1 F (36.7 C)  TempSrc: Tympanic  Weight: 199 lb 14.4 oz (90.7 kg)  Height: 5' 7.32" (1.71  m)   Physical Exam  Constitutional: She is oriented to person, place, and time and well-developed, well-nourished, and in no distress.  HENT:  Head: Normocephalic and atraumatic.  Eyes: EOM are normal. Pupils are equal, round, and reactive to light.  Neck: Normal range of motion.  Cardiovascular: Normal rate, regular rhythm and normal heart sounds.  Pulmonary/Chest: Effort normal and breath sounds normal.  Abdominal: Soft. Bowel sounds are normal.  Midline surgical scar and laparoscopy scars are well opposed and healing well.  No signs of infection or open wounds.  Neurological: She is alert and oriented to person, place, and time.  Skin: Skin is warm and dry.       CMP Latest Ref Rng & Units 11/14/2017  Glucose 65 - 99 mg/dL 82  BUN 6 - 20 mg/dL 8  Creatinine 0.44 - 1.00 mg/dL 0.72  Sodium  135 - 145 mmol/L 139  Potassium 3.5 - 5.1 mmol/L 3.5  Chloride 101 - 111 mmol/L 107  CO2 22 - 32 mmol/L 26  Calcium 8.9 - 10.3 mg/dL 8.2(L)  Total Protein 6.5 - 8.1 g/dL -  Total Bilirubin 0.3 - 1.2 mg/dL -  Alkaline Phos 38 - 126 U/L -  AST 15 - 41 U/L -  ALT 14 - 54 U/L -   CBC Latest Ref Rng & Units 11/15/2017  WBC 3.6 - 11.0 K/uL 3.6  Hemoglobin 12.0 - 16.0 g/dL 8.1(L)  Hematocrit 35.0 - 47.0 % 25.7(L)  Platelets 150 - 440 K/uL 205    No images are attached to the encounter.  Ct Abdomen Pelvis W Contrast  Result Date: 10/26/2017 CLINICAL DATA:  Abdominal pain with history of diverticulitis, follow-up exam EXAM: CT ABDOMEN AND PELVIS WITH CONTRAST TECHNIQUE: Multidetector CT imaging of the abdomen and pelvis was performed using the standard protocol following bolus administration of intravenous contrast. CONTRAST:  100 mL Isovue-300 COMPARISON:  09/02/2017 FINDINGS: Lower chest: No acute abnormality. Hepatobiliary: No focal liver abnormality is seen. No gallstones, gallbladder wall thickening, or biliary dilatation. Pancreas: Unremarkable. No pancreatic ductal dilatation or surrounding inflammatory changes. Spleen: Normal in size without focal abnormality. Adrenals/Urinary Tract: Adrenal glands are unremarkable. Kidneys are normal, without renal calculi, focal lesion, or hydronephrosis. Bladder is unremarkable. Stomach/Bowel: In the sigmoid colon there is again identified wall thickening and mild pericolonic inflammatory changes consistent with diverticulitis. The overall appearance however has improved when compared with the prior study. Again no abscess is seen. No perforation is noted. No other inflammatory or obstructive changes are noted. Vascular/Lymphatic: No significant vascular findings are present. No enlarged abdominal or pelvic lymph nodes. Reproductive: Status post hysterectomy. No adnexal masses. Other: No abdominal wall hernia or abnormality. No abdominopelvic ascites.  Musculoskeletal: No acute or significant osseous findings. IMPRESSION: Residual changes of diverticulitis although improved when compared with the prior exam. No perforation or abscess is identified. No other focal abnormality is seen. Electronically Signed   By: Inez Catalina M.D.   On: 10/26/2017 14:12    Assessment and plan- Patient is a 46 y.o. 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  I discussed the results of the CT scan and pathology with the patient in detail.  At this time she would need a CT chest with contrast to complete her staging workup.    I spoke to Dr. Dahlia Byes over the phone today and again clarified with him that this was indeed colon  and not rectal cancer based on location.  Given that patient has high risk stage III colon cancer (T4 disease), I would recommend 6 months of adjuvant chemotherapy with FOLFOX instead of 3 months  Discussed risks and benefits of chemotherapy including all but not limited to nausea, vomiting, fatigue, diarrhea, low blood counts, risk of infections and hospitalizations.  Risk of peripheral neuropathy associated with oxaliplatin.  Chemotherapy will be given with curative intent.  Patient will need port placement prior to chemotherapy.  Treatment will be given IV every 2 weeks for 12 cycles or 6 months.  I will plan to get interim scans after 3 months of treatment.  I will obtain CBC with ferritin and iron studies today and if she continues to have iron deficiency I will plan to give her 2 doses of Feraheme.  Discussed risks and benefits of Feraheme including all but not limited to headache, fatigue, leg swelling and risk of infusion reactions.  Patient understands and agrees to proceed. Will also check B12 and folate levels  Discussed per [facility] ([facility] database) - there is a  ~78% chance of being disease-free 3 years after diagnosis and OS  of 80% with adjuvant chemotherapy.  Chemotherapy ideally needs to be started within 8 weeks of surgery.  Patient will proceed with port placement with Dr. Dahlia Byes.  I will proceed with adjuvant chemotherapy after surgical clearance.  Patient will proceed for chemotherapy class.  I will tentatively see her on 12/08/17. She will tentatively start chemotherapy on 12/14/17.   Given family history of breast cancer in her mother in 102s and personal history of colon cancer before the age of 38 I will refer her for genetic counseling as well  Patient will need completion colonoscopy and I think it would be reasonable for her to wait to go through this after she finishes her chemotherapy  Cbc/ cmp/ CEA, ferritina nd iron studies, b12 and folate today  I will discuss her case at the tumor board to see if there would be any role for adjuvant radiation given that she had T4b disease down the line upon completion of chemotherapy  Thank you for this kind referral and the opportunity to participate in the care of this patient   Visit Diagnosis 1. Cancer of sigmoid ([place])   2. Goals of care, counseling/discussion   3. Normocytic anemia     Dr. Randa Evens, MD, MPH [facility] at [facility] Pager- [phone] 11/25/2017 9:38 AM

## 2017-11-25 NOTE — Telephone Encounter (Signed)
Chemo class and Feraheme infusion dates rschd per patient request. [facility] Placement on 11/29/17) Judeen Hammans aware. MF

## 2017-11-25 NOTE — Progress Notes (Signed)
Here for new pt evaluation. S/p bowel resection Nov 23/18. Doing well since then she stated w minimal pain ( see pain evaluation )

## 2017-11-25 NOTE — H&P (View-Only) (Signed)
Hematology/Oncology Consult note [facility] [phone] [phone]  Patient Care Team: Mikey College, NP as PCP - General (Nurse Practitioner)   Name of the patient: Regina Patel  [id number]  04-Sep-1971    Reason for referral- colon cancer   Referring physician- Dr. Dahlia Byes  Date of visit: 11/25/17   History of presenting illness-patient is a 46 year old female who was seen by Dr. Vicente Males from GI for recurrent episodes of diverticulitis since august 2018 which was treated with antibiotics.  However patient continued to have some abdominal cramping along with some bleeding in her stool and constipation alternating with diarrhea.  Patient underwent flexible sigmoidoscopy on 11/07/2017.  A circumferential infiltrative completely obstructing large mass was found in the rectosigmoid colon.  Biopsy showed at least high-grade dysplasia.  Patient was referred to Dr. Dahlia Byes for consideration of hemicolectomy.  Patient underwent laparoscopic low anterior resection along with  small bowel resection and laparoscopic takedown of the splenic flexure on 11/11/2017.  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  Tumor size was 3 cm, moderately differentiated.  All margins were negative for invasive carcinoma.  Perineural invasion and lymphovascular invasion was present.  1 out of 17 lymph nodes was positive for malignancy.  Pathologic stage was pT4b pN1a. MSI stable  Preoperative CEA is not available.  CT abdomen on 10/26/2017 showed residual changes of diverticulitis although improved when compared to prior exam.  No perforation or abscess is identified.  No family history of colon cancer.  History of breast cancer in her mother in her 68s.  Currently patient reports doing well.  Her pain is well controlled and she denies any complaints  ECOG PS- 0  Pain scale- 0   Review of systems- Review of Systems    Constitutional: Negative for chills, fever, malaise/fatigue and weight loss.  HENT: Negative for congestion, ear discharge and nosebleeds.   Eyes: Negative for blurred vision.  Respiratory: Negative for cough, hemoptysis, sputum production, shortness of breath and wheezing.   Cardiovascular: Negative for chest pain, palpitations, orthopnea and claudication.  Gastrointestinal: Negative for abdominal pain, blood in stool, constipation, diarrhea, heartburn, melena, nausea and vomiting.  Genitourinary: Negative for dysuria, flank pain, frequency, hematuria and urgency.  Musculoskeletal: Negative for back pain, joint pain and myalgias.  Skin: Negative for rash.  Neurological: Negative for dizziness, tingling, focal weakness, seizures, weakness and headaches.  Endo/Heme/Allergies: Does not bruise/bleed easily.  Psychiatric/Behavioral: Negative for depression and suicidal ideas. The patient does not have insomnia.     No Known Allergies  Patient Active Problem List   Diagnosis Date Noted  . S/P laparoscopic colectomy 11/11/2017  . Cancer of sigmoid ([place])   . Abscess of sigmoid colon   . Diverticulitis of large intestine with abscess 07/23/2017  . Obesity 04/22/2016  . 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 Hypothyroidism 10/02/2015  . Status post abdominal hysterectomy and right salpingo-oophorectomy 10/02/2015  . Abdominal bloating 05/03/2015  . 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 Multiple benign melanocytic nevi 05/03/2015  . Avitaminosis D 05/03/2015     Past Medical History:  Diagnosis Date  . Anemia   . Diverticulosis   . Headache   . Heart murmur   . Hyperlipidemia   . Hypothyroidism   . Thyroid disease      Past Surgical History:  Procedure Laterality Date  . APPENDECTOMY     possible during hysterectomy  . BOWEL RESECTION N/A 11/11/2017  Procedure: SMALL BOWEL RESECTION;  Surgeon: Jules Husbands, MD;  Location:  [facility] ORS;  Service: General;  Laterality: N/A;  . FLEXIBLE SIGMOIDOSCOPY N/A 11/07/2017   Procedure: Beryle Quant;  Surgeon: Jonathon Bellows, MD;  Location: [facility] ENDOSCOPY;  Service: Gastroenterology;  Laterality: N/A;  . LAPAROSCOPIC SIGMOID COLECTOMY N/A 11/11/2017   Procedure: LAPAROSCOPIC SIGMOID COLECTOMY;  Surgeon: Jules Husbands, MD;  Location: [facility] ORS;  Service: General;  Laterality: N/A;  . MOUTH SURGERY    . TOTAL ABDOMINAL HYSTERECTOMY  05/20/2014   ooperectomy unilateral    Social History   Socioeconomic History  . Marital status: Married    Spouse name: Not on file  . Number of children: Not on file  . Years of education: Not on file  . Highest education level: Not on file  Social Needs  . Financial resource strain: Not on file  . Food insecurity - worry: Not on file  . Food insecurity - inability: Not on file  . Transportation needs - medical: Not on file  . Transportation needs - non-medical: Not on file  Occupational History  . Not on file  Tobacco Use  . Smoking status: Never Smoker  . Smokeless tobacco: Never Used  Substance and Sexual Activity  . Alcohol use: No    Alcohol/week: 0.0 oz  . Drug use: No  . Sexual activity: Yes  Other Topics Concern  . Not on file  Social History Narrative  . Not on file     Family History  Problem Relation Age of Onset  . Breast cancer Mother 34  . Hypertension Mother   . Thyroid disease Brother   . Hernia Brother      Current Outpatient Medications:  .  APPLE CIDER VINEGAR PO, Take 1 tablet by mouth daily., Disp: , Rfl:  .  Biotin 10 MG TABS, Take 2 tablets by mouth daily. , Disp: , Rfl:  .  cyanocobalamin (,VITAMIN B-12,) 1000 MCG/ML injection, , Disp: , Rfl:  .  dicyclomine (BENTYL) 10 MG capsule, Take 1 capsule (10 mg total) by mouth 4 (four) times daily -  before meals and at bedtime., Disp: 90 capsule, Rfl: 0 .  IRON, IRON,, Take 325 mg by mouth 2 (two) times daily. , Disp: , Rfl:  .  levothyroxine  (SYNTHROID, LEVOTHROID) 175 MCG tablet, Take 1 tablet (175 mcg total) by mouth daily before breakfast., Disp: 30 tablet, Rfl: 11 .  Multiple Vitamin tablet, Take 2 tablets by mouth daily. , Disp: , Rfl:  .  OMEGA-3 FATTY ACIDS PO, Take 2 capsules by mouth daily. , Disp: , Rfl:  .  VITAMIN D, CHOLECALCIFEROL, PO, Take 1 tablet by mouth daily., Disp: , Rfl:  .  bisacodyl (DULCOLAX) 5 MG EC tablet, Take 1 tablet (5 mg total) by mouth daily as needed for moderate constipation. (Patient not taking: Reported on 11/25/2017), Disp: 4 tablet, Rfl: 0 .  oxyCODONE (OXY IR/ROXICODONE) 5 MG immediate release tablet, Take 1-2 tablets (5-10 mg total) by mouth every 4 (four) hours as needed for severe pain. (Patient not taking: Reported on 11/25/2017), Disp: 30 tablet, Rfl: 0 .  polyethylene glycol powder (GLYCOLAX/MIRALAX) powder, Please see bowel prep instructions (Patient not taking: Reported on 11/25/2017), Disp: 255 g, Rfl: 0   Physical exam:  Vitals:   11/25/17 0834  BP: (!) 141/83  Pulse: 98  Resp: 18  Temp: 98.1 F (36.7 C)  TempSrc: Tympanic  Weight: 199 lb 14.4 oz (90.7 kg)  Height: 5' 7.32" (1.71  m)   Physical Exam  Constitutional: She is oriented to person, place, and time and well-developed, well-nourished, and in no distress.  HENT:  Head: Normocephalic and atraumatic.  Eyes: EOM are normal. Pupils are equal, round, and reactive to light.  Neck: Normal range of motion.  Cardiovascular: Normal rate, regular rhythm and normal heart sounds.  Pulmonary/Chest: Effort normal and breath sounds normal.  Abdominal: Soft. Bowel sounds are normal.  Midline surgical scar and laparoscopy scars are well opposed and healing well.  No signs of infection or open wounds.  Neurological: She is alert and oriented to person, place, and time.  Skin: Skin is warm and dry.       CMP Latest Ref Rng & Units 11/14/2017  Glucose 65 - 99 mg/dL 82  BUN 6 - 20 mg/dL 8  Creatinine 0.44 - 1.00 mg/dL 0.72  Sodium  135 - 145 mmol/L 139  Potassium 3.5 - 5.1 mmol/L 3.5  Chloride 101 - 111 mmol/L 107  CO2 22 - 32 mmol/L 26  Calcium 8.9 - 10.3 mg/dL 8.2(L)  Total Protein 6.5 - 8.1 g/dL -  Total Bilirubin 0.3 - 1.2 mg/dL -  Alkaline Phos 38 - 126 U/L -  AST 15 - 41 U/L -  ALT 14 - 54 U/L -   CBC Latest Ref Rng & Units 11/15/2017  WBC 3.6 - 11.0 K/uL 3.6  Hemoglobin 12.0 - 16.0 g/dL 8.1(L)  Hematocrit 35.0 - 47.0 % 25.7(L)  Platelets 150 - 440 K/uL 205    No images are attached to the encounter.  Ct Abdomen Pelvis W Contrast  Result Date: 10/26/2017 CLINICAL DATA:  Abdominal pain with history of diverticulitis, follow-up exam EXAM: CT ABDOMEN AND PELVIS WITH CONTRAST TECHNIQUE: Multidetector CT imaging of the abdomen and pelvis was performed using the standard protocol following bolus administration of intravenous contrast. CONTRAST:  100 mL Isovue-300 COMPARISON:  09/02/2017 FINDINGS: Lower chest: No acute abnormality. Hepatobiliary: No focal liver abnormality is seen. No gallstones, gallbladder wall thickening, or biliary dilatation. Pancreas: Unremarkable. No pancreatic ductal dilatation or surrounding inflammatory changes. Spleen: Normal in size without focal abnormality. Adrenals/Urinary Tract: Adrenal glands are unremarkable. Kidneys are normal, without renal calculi, focal lesion, or hydronephrosis. Bladder is unremarkable. Stomach/Bowel: In the sigmoid colon there is again identified wall thickening and mild pericolonic inflammatory changes consistent with diverticulitis. The overall appearance however has improved when compared with the prior study. Again no abscess is seen. No perforation is noted. No other inflammatory or obstructive changes are noted. Vascular/Lymphatic: No significant vascular findings are present. No enlarged abdominal or pelvic lymph nodes. Reproductive: Status post hysterectomy. No adnexal masses. Other: No abdominal wall hernia or abnormality. No abdominopelvic ascites.  Musculoskeletal: No acute or significant osseous findings. IMPRESSION: Residual changes of diverticulitis although improved when compared with the prior exam. No perforation or abscess is identified. No other focal abnormality is seen. Electronically Signed   By: Inez Catalina M.D.   On: 10/26/2017 14:12    Assessment and plan- Patient is a 46 y.o. 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  I discussed the results of the CT scan and pathology with the patient in detail.  At this time she would need a CT chest with contrast to complete her staging workup.    I spoke to Dr. Dahlia Byes over the phone today and again clarified with him that this was indeed colon  and not rectal cancer based on location.  Given that patient has high risk stage III colon cancer (T4 disease), I would recommend 6 months of adjuvant chemotherapy with FOLFOX instead of 3 months  Discussed risks and benefits of chemotherapy including all but not limited to nausea, vomiting, fatigue, diarrhea, low blood counts, risk of infections and hospitalizations.  Risk of peripheral neuropathy associated with oxaliplatin.  Chemotherapy will be given with curative intent.  Patient will need port placement prior to chemotherapy.  Treatment will be given IV every 2 weeks for 12 cycles or 6 months.  I will plan to get interim scans after 3 months of treatment.  I will obtain CBC with ferritin and iron studies today and if she continues to have iron deficiency I will plan to give her 2 doses of Feraheme.  Discussed risks and benefits of Feraheme including all but not limited to headache, fatigue, leg swelling and risk of infusion reactions.  Patient understands and agrees to proceed. Will also check B12 and folate levels  Discussed per [facility] ([facility]cent database) - there is a  ~78% chance of being disease-free 3 years after diagnosis and OS  of 80% with adjuvant chemotherapy.  Chemotherapy ideally needs to be started within 8 weeks of surgery.  Patient will proceed with port placement with Dr. Dahlia Byes.  I will proceed with adjuvant chemotherapy after surgical clearance.  Patient will proceed for chemotherapy class.  I will tentatively see her on 12/08/17. She will tentatively start chemotherapy on 12/14/17.   Given family history of breast cancer in her mother in 82s and personal history of colon cancer before the age of 40 I will refer her for genetic counseling as well  Patient will need completion colonoscopy and I think it would be reasonable for her to wait to go through this after she finishes her chemotherapy  Cbc/ cmp/ CEA, ferritina nd iron studies, b12 and folate today  I will discuss her case at the tumor board to see if there would be any role for adjuvant radiation given that she had T4b disease down the line upon completion of chemotherapy  Thank you for this kind referral and the opportunity to participate in the care of this patient   Visit Diagnosis 1. Cancer of sigmoid ([place])   2. Goals of care, counseling/discussion   3. Normocytic anemia     Dr. Randa Evens, MD, MPH [facility] at [facility] Pager- [phone] 11/25/2017 9:38 AM

## 2017-11-25 NOTE — Progress Notes (Signed)
  Oncology Nurse Navigator Documentation Met with Mrs. Teodoro Kil and her spouse during and after consult with Dr. Janese Banks. Introduced Therapist, nutritional and provided contact information for any future needs. Contacting her insurance company regarding port a cath insertion. She reports as of Nov 03, 2017 Dr. Dahlia Byes is no longer in her network.  Navigator Location: [facility] (11/25/17 1300)   )Navigator Encounter Type: Initial MedOnc (11/25/17 1300)                     Patient Visit Type: MedOnc;Initial (11/25/17 1300) Treatment Phase: Pre-Tx/Tx Discussion (11/25/17 1300) Barriers/Navigation Needs: Coordination of Care (11/25/17 1300)                Acuity: Level 2 (11/25/17 1300)   Acuity Level 2: Initial guidance, education and coordination as needed;Educational needs;Ongoing guidance and education throughout treatment as needed;Assistance expediting appointments (11/25/17 1300)     Time Spent with Patient: 90 (11/25/17 1300)

## 2017-11-25 NOTE — Progress Notes (Signed)
START ON PATHWAY REGIMEN - Colorectal     A cycle is every 14 days:     Oxaliplatin      Leucovorin      5-Fluorouracil      5-Fluorouracil   **Always confirm dose/schedule in your pharmacy ordering system**    Patient Characteristics: Colon Adjuvant, Stage III, High Risk (T4 or N2) Current evidence of distant metastases<= No AJCC T Category: T4b AJCC N Category: N1a AJCC M Category: M0 AJCC 8 Stage Grouping: IIIC Intent of Therapy: Curative Intent, Discussed with Patient

## 2017-11-26 LAB — CEA: CEA: 1.2 ng/mL (ref 0.0–4.7)

## 2017-11-29 ENCOUNTER — Ambulatory Visit: Payer: Managed Care, Other (non HMO) | Admitting: Anesthesiology

## 2017-11-29 ENCOUNTER — Other Ambulatory Visit: Payer: Managed Care, Other (non HMO)

## 2017-11-29 ENCOUNTER — Ambulatory Visit: Payer: Managed Care, Other (non HMO)

## 2017-11-29 ENCOUNTER — Ambulatory Visit
Admission: RE | Admit: 2017-11-29 | Discharge: 2017-11-29 | Disposition: A | Discharge status: Home / Self Care | Payer: Managed Care, Other (non HMO) | Source: Ambulatory Visit | Attending: Surgery | Admitting: Surgery

## 2017-11-29 ENCOUNTER — Encounter
Admission: RE | Disposition: A | Discharge status: Home / Self Care | Payer: Self-pay | Source: Ambulatory Visit | Attending: Surgery

## 2017-11-29 ENCOUNTER — Ambulatory Visit
Admit: 2017-11-29 | Discharge: 2017-11-29 | Disposition: A | Discharge status: Home / Self Care | Payer: Managed Care, Other (non HMO) | Attending: Surgery | Admitting: Surgery

## 2017-11-29 DIAGNOSIS — Z90710 Acquired absence of both cervix and uterus: Secondary | ICD-10-CM | POA: Insufficient documentation

## 2017-11-29 DIAGNOSIS — Z8249 Family history of ischemic heart disease and other diseases of the circulatory system: Secondary | ICD-10-CM | POA: Insufficient documentation

## 2017-11-29 DIAGNOSIS — K5900 Constipation, unspecified: Secondary | ICD-10-CM | POA: Insufficient documentation

## 2017-11-29 DIAGNOSIS — K5720 Diverticulitis of large intestine with perforation and abscess without bleeding: Secondary | ICD-10-CM | POA: Diagnosis not present

## 2017-11-29 DIAGNOSIS — Z8489 Family history of other specified conditions: Secondary | ICD-10-CM | POA: Insufficient documentation

## 2017-11-29 DIAGNOSIS — Z95828 Presence of other vascular implants and grafts: Secondary | ICD-10-CM

## 2017-11-29 DIAGNOSIS — C784 Secondary malignant neoplasm of small intestine: Secondary | ICD-10-CM | POA: Insufficient documentation

## 2017-11-29 DIAGNOSIS — D649 Anemia, unspecified: Secondary | ICD-10-CM | POA: Insufficient documentation

## 2017-11-29 DIAGNOSIS — Z8349 Family history of other endocrine, nutritional and metabolic diseases: Secondary | ICD-10-CM | POA: Insufficient documentation

## 2017-11-29 DIAGNOSIS — Z9049 Acquired absence of other specified parts of digestive tract: Secondary | ICD-10-CM | POA: Insufficient documentation

## 2017-11-29 DIAGNOSIS — E669 Obesity, unspecified: Secondary | ICD-10-CM | POA: Diagnosis not present

## 2017-11-29 DIAGNOSIS — E039 Hypothyroidism, unspecified: Secondary | ICD-10-CM | POA: Insufficient documentation

## 2017-11-29 DIAGNOSIS — E782 Mixed hyperlipidemia: Secondary | ICD-10-CM | POA: Insufficient documentation

## 2017-11-29 DIAGNOSIS — R51 Headache: Secondary | ICD-10-CM | POA: Diagnosis not present

## 2017-11-29 DIAGNOSIS — R011 Cardiac murmur, unspecified: Secondary | ICD-10-CM | POA: Insufficient documentation

## 2017-11-29 DIAGNOSIS — C187 Malignant neoplasm of sigmoid colon: Secondary | ICD-10-CM

## 2017-11-29 DIAGNOSIS — Z6830 Body mass index (BMI) 30.0-30.9, adult: Secondary | ICD-10-CM | POA: Diagnosis not present

## 2017-11-29 DIAGNOSIS — E559 Vitamin D deficiency, unspecified: Secondary | ICD-10-CM | POA: Insufficient documentation

## 2017-11-29 DIAGNOSIS — Z803 Family history of malignant neoplasm of breast: Secondary | ICD-10-CM | POA: Insufficient documentation

## 2017-11-29 DIAGNOSIS — Z79899 Other long term (current) drug therapy: Secondary | ICD-10-CM | POA: Diagnosis not present

## 2017-11-29 DIAGNOSIS — C19 Malignant neoplasm of rectosigmoid junction: Secondary | ICD-10-CM | POA: Insufficient documentation

## 2017-11-29 DIAGNOSIS — C189 Malignant neoplasm of colon, unspecified: Secondary | ICD-10-CM | POA: Diagnosis present

## 2017-11-29 DIAGNOSIS — E7849 Other hyperlipidemia: Secondary | ICD-10-CM | POA: Insufficient documentation

## 2017-11-29 HISTORY — PX: PORTACATH PLACEMENT: SHX2246

## 2017-11-29 SURGERY — INSERTION, TUNNELED CENTRAL VENOUS DEVICE, WITH PORT
Anesthesia: General | Site: Chest | Laterality: Right | Wound class: Clean

## 2017-11-29 MED ORDER — LIDOCAINE HCL (CARDIAC) 20 MG/ML IV SOLN
INTRAVENOUS | Status: DC | PRN
Start: 2017-11-29 — End: 2017-11-29
  Administered 2017-11-29: 50 mg via INTRAVENOUS

## 2017-11-29 MED ORDER — CHLORHEXIDINE GLUCONATE CLOTH 2 % EX PADS: 6.0000 | MEDICATED_PAD | Freq: Once | CUTANEOUS | Status: DC

## 2017-11-29 MED ORDER — LACTATED RINGERS IV SOLN
INTRAVENOUS | Status: DC
  Administered 2017-11-29: 13:00:00 via INTRAVENOUS

## 2017-11-29 MED ORDER — MIDAZOLAM HCL 2 MG/2ML IJ SOLN
INTRAMUSCULAR | Status: DC | PRN
  Administered 2017-11-29: 2 mg via INTRAVENOUS

## 2017-11-29 MED ORDER — PROPOFOL 500 MG/50ML IV EMUL
INTRAVENOUS | Status: DC | PRN
Start: 2017-11-29 — End: 2017-11-29
  Administered 2017-11-29: 100 ug/kg/min via INTRAVENOUS

## 2017-11-29 MED ORDER — CEFAZOLIN SODIUM-DEXTROSE 2-4 GM/100ML-% IV SOLN: 2.0000 g | INTRAVENOUS | Status: DC

## 2017-11-29 MED ORDER — SODIUM CHLORIDE 0.9 % IV SOLN
INTRAVENOUS | Status: DC | PRN
  Administered 2017-11-29: 14:00:00 5 mL via INTRAMUSCULAR

## 2017-11-29 MED ORDER — FENTANYL CITRATE (PF) 100 MCG/2ML IJ SOLN
INTRAMUSCULAR | Status: DC | PRN
  Administered 2017-11-29: 100 ug via INTRAVENOUS

## 2017-11-29 MED ORDER — BUPIVACAINE HCL (PF) 0.25 % IJ SOLN
INTRAMUSCULAR | Status: DC | PRN
  Administered 2017-11-29: 10 mL

## 2017-11-29 MED ORDER — LIDOCAINE-EPINEPHRINE 1 %-1:100000 IJ SOLN
INTRAMUSCULAR | Status: DC | PRN
  Administered 2017-11-29: 10 mL

## 2017-11-29 SURGICAL SUPPLY — 29 items
BLADE SURG SZ11 CARB STEEL (BLADE) ×3 IMPLANT
BOOT SUTURE AID YELLOW STND (SUTURE) ×3 IMPLANT
CANISTER SUCT 1200ML W/VALVE (MISCELLANEOUS) ×3 IMPLANT
CHLORAPREP W/TINT 26ML (MISCELLANEOUS) ×3 IMPLANT
COVER LIGHT HANDLE UNIVERSAL (MISCELLANEOUS) ×6 IMPLANT
DRAPE C-ARM XRAY 36X54 (DRAPES) ×3 IMPLANT
DRAPE INCISE IOBAN 66X45 STRL (DRAPES) ×3 IMPLANT
GEL ULTRASOUND 20GR AQUASONIC (MISCELLANEOUS) ×3 IMPLANT
GLOVE BIO SURGEON STRL SZ7 (GLOVE) ×3 IMPLANT
GOWN STRL REUS W/ TWL LRG LVL3 (GOWN DISPOSABLE) ×2 IMPLANT
GOWN STRL REUS W/TWL LRG LVL3 (GOWN DISPOSABLE) ×4
IV NS 500ML (IV SOLUTION) ×2
IV NS 500ML BAXH (IV SOLUTION) ×1 IMPLANT
KIT PORT POWER 8FR ISP CVUE (Miscellaneous) ×3 IMPLANT
LIQUID BAND (GAUZE/BANDAGES/DRESSINGS) ×3 IMPLANT
NDL HYPO TW 22X1.5 (NEEDLE) ×6 IMPLANT
NEEDLE HYPO 25GX1X1/2 BEV (NEEDLE) ×3 IMPLANT
NS IRRIG 1000ML POUR BTL (IV SOLUTION) ×3 IMPLANT
PACK PORT-A-CATH (MISCELLANEOUS) ×3 IMPLANT
PAD GROUND ADULT SPLIT (MISCELLANEOUS) ×3 IMPLANT
SPONGE LAP 18X18 5 PK (GAUZE/BANDAGES/DRESSINGS) ×3 IMPLANT
SUT MNCRL AB 4-0 PS2 18 (SUTURE) ×3 IMPLANT
SUT PROLENE 2-0 (SUTURE) ×2
SUT PROLENE 2-0 RB1 36X2 ARM (SUTURE) ×1
SUT VIC AB 3-0 SH 27 (SUTURE) ×2
SUT VIC AB 3-0 SH 27X BRD (SUTURE) ×1 IMPLANT
SUTURE PROLEN 2-0 RB1 36X2 ARM (SUTURE) ×1 IMPLANT
SYR 20CC LL (SYRINGE) ×6 IMPLANT
SYR 5ML LL (SYRINGE) ×3 IMPLANT

## 2017-11-29 NOTE — Interval H&P Note (Signed)
History and Physical Interval Note:  11/29/2017 12:19 PM  Regina Patel  has presented today for surgery, with the diagnosis of colon ca  The various methods of treatment have been discussed with the patient and family. After consideration of risks, benefits and other options for treatment, the patient has consented to  Procedure(s) with comments: INSERTION PORT-A-CATH (N/A) - May do local and IV sedation. Please have U/S as a surgical intervention .  The patient's history has been reviewed, patient examined, no change in status, stable for surgery.  I have reviewed the patient's chart and labs.  Questions were answered to the patient's satisfaction.   She is doing well after colectomy w/o complications. No CI to port placement  [place]

## 2017-11-29 NOTE — Anesthesia Preprocedure Evaluation (Signed)
Anesthesia Evaluation  Patient identified by MRN, date of birth, ID band Patient awake    History of Anesthesia Complications Negative for: history of anesthetic complications  Airway Mallampati: I  TM Distance: >3 FB Neck ROM: Full    Dental no notable dental hx.    Pulmonary neg pulmonary ROS,    Pulmonary exam normal breath sounds clear to auscultation       Cardiovascular Exercise Tolerance: Good + Valvular Problems/Murmurs (murmur)  Rhythm:Regular Rate:Normal + Systolic murmurs    Neuro/Psych negative neurological ROS     GI/Hepatic negative GI ROS,   Endo/Other  Hypothyroidism   Renal/GU negative Renal ROS     Musculoskeletal   Abdominal   Peds  Hematology  (+) Blood dyscrasia, anemia , Colon CA, ovarian CA   Anesthesia Other Findings   Reproductive/Obstetrics                             Anesthesia Physical Anesthesia Plan  ASA: III  Anesthesia Plan: General   Post-op Pain Management:    Induction: Intravenous  PONV Risk Score and Plan: 1 and Propofol infusion  Airway Management Planned: Natural Airway  Additional Equipment:   Intra-op Plan:   Post-operative Plan:   Informed Consent: I have reviewed the patients History and Physical, chart, labs and discussed the procedure including the risks, benefits and alternatives for the proposed anesthesia with the patient or authorized representative who has indicated his/her understanding and acceptance.     Plan Discussed with: CRNA  Anesthesia Plan Comments:         Anesthesia Quick Evaluation

## 2017-11-29 NOTE — Op Note (Signed)
  Pre-operative Diagnosis: colon ca  Post-operative Diagnosis: same   Surgeon: Caroleen Hamman, MD FACS  Anesthesia: IV sedation, marcaine .25% w epi and lidocaine 1%  Procedure: right IJ  Port placement with fluoroscopy under U/S guidance  Findings: Good position of the tip of the catheter by fluoroscopy  Estimated Blood Loss: Minimal         Drains: None         Specimens: None       Complications: none         Procedure Details  The patient was seen again in the Holding Room. The benefits, complications, treatment options, and expected outcomes were discussed with the patient. The risks of bleeding, infection, recurrence of symptoms, failure to resolve symptoms,  thrombosis nonfunction breakage pneumothorax hemopneumothorax any of which could require chest tube or further surgery were reviewed with the patient.   The patient was taken to Operating Room, identified as Regina Patel and the procedure verified.  A Time Out was held and the above information confirmed.  Prior to the induction of general anesthesia, antibiotic prophylaxis was administered. VTE prophylaxis was in place. Appropriate anesthesia was then administered and tolerated well. The chest was prepped with Chloraprep and draped in the sterile fashion. The patient was positioned in the supine position. Then the patient was placed in Trendelenburg position.  Patient was prepped and draped in sterile fashion and in a Trendelenburg position local anesthetic was infiltrated into the skin and subcutaneous tissues in the neck and anterior chest wall. The large bore needle was placed into the internal jugular vein under U/S guidance without difficulty and then the Seldinger wire was advanced. Fluoroscopy was utilized to confirm that the Seldinger wire was in the superior vena cava.  An incision was made and a port pocket developed with blunt and electrocautery dissection. The introducer dilator was placed over the Seldinger wire the  wire was removed. The previously flushed catheter was placed into the introducer dilator and the peel-away sheath was removed. The catheter length was confirmed and trimmed utilizing fluoroscopy for proper positioning. The catheter was then attached to the previously flushed port. The port was placed into the pocket. The port was held in with 2-0 Prolenes and flushed for function and heparin locked.  The wound was closed with interrupted 3-0 Vicryl followed by 4-0 subcuticular Monocryl sutures. Dermabond used to coat the skin  Patient was taken to the recovery room in stable condition where a postoperative chest film has been ordered.

## 2017-11-29 NOTE — Anesthesia Postprocedure Evaluation (Signed)
Anesthesia Post Note  Patient: Vanita Cannell  Procedure(s) Performed: INSERTION PORT-A-CATH (Right Chest)  Patient location during evaluation: PACU Anesthesia Type: General Level of consciousness: awake and alert, oriented and patient cooperative Pain management: pain level controlled Vital Signs Assessment: post-procedure vital signs reviewed and stable Respiratory status: spontaneous breathing, nonlabored ventilation and respiratory function stable Cardiovascular status: blood pressure returned to baseline and stable Postop Assessment: adequate PO intake Anesthetic complications: no    Darrin Nipper

## 2017-11-29 NOTE — Discharge Instructions (Signed)
General Anesthesia, Adult, Care After °These instructions provide you with information about caring for yourself after your procedure. Your health care provider may also give you more specific instructions. Your treatment has been planned according to current medical practices, but problems sometimes occur. Call your health care provider if you have any problems or questions after your procedure. °What can I expect after the procedure? °After the procedure, it is common to have: °· Vomiting. °· A sore throat. °· Mental slowness. ° °It is common to feel: °· Nauseous. °· Cold or shivery. °· Sleepy. °· Tired. °· Sore or achy, even in parts of your body where you did not have surgery. ° °Follow these instructions at home: °For at least 24 hours after the procedure: °· Do not: °? Participate in activities where you could fall or become injured. °? Drive. °? Use heavy machinery. °? Drink alcohol. °? Take sleeping pills or medicines that cause drowsiness. °? Make important decisions or sign legal documents. °? Take care of children on your own. °· Rest. °Eating and drinking °· If you vomit, drink water, juice, or soup when you can drink without vomiting. °· Drink enough fluid to keep your urine clear or pale yellow. °· Make sure you have little or no nausea before eating solid foods. °· Follow the diet recommended by your health care provider. °General instructions °· Have a responsible adult stay with you until you are awake and alert. °· Return to your normal activities as told by your health care provider. Ask your health care provider what activities are safe for you. °· Take over-the-counter and prescription medicines only as told by your health care provider. °· If you smoke, do not smoke without supervision. °· Keep all follow-up visits as told by your health care provider. This is important. °Contact a health care provider if: °· You continue to have nausea or vomiting at home, and medicines are not helpful. °· You  cannot drink fluids or start eating again. °· You cannot urinate after 8-12 hours. °· You develop a skin rash. °· You have fever. °· You have increasing redness at the site of your procedure. °Get help right away if: °· You have difficulty breathing. °· You have chest pain. °· You have unexpected bleeding. °· You feel that you are having a life-threatening or urgent problem. °This information is not intended to replace advice given to you by your health care provider. Make sure you discuss any questions you have with your health care provider. °Document Released: 03/14/2001 Document Revised: 05/10/2016 Document Reviewed: 11/20/2015 °Elsevier Interactive Patient Education © 2018 [employer] ° °

## 2017-11-29 NOTE — Transfer of Care (Signed)
Immediate Anesthesia Transfer of Care Note  Patient: Regina Patel  Procedure(s) Performed: INSERTION PORT-A-CATH (Right Chest)  Patient Location: PACU  Anesthesia Type: General  Level of Consciousness: awake, alert  and patient cooperative  Airway and Oxygen Therapy: Patient Spontanous Breathing and Patient connected to supplemental oxygen  Post-op Assessment: Post-op Vital signs reviewed, Patient's Cardiovascular Status Stable, Respiratory Function Stable, Patent Airway and No signs of Nausea or vomiting  Post-op Vital Signs: Reviewed and stable  Complications: No apparent anesthesia complications

## 2017-11-29 NOTE — Anesthesia Procedure Notes (Signed)
Performed by: Mata Rowen, CRNA Pre-anesthesia Checklist: Patient identified, Emergency Drugs available, Suction available, Timeout performed and Patient being monitored Patient Re-evaluated:Patient Re-evaluated prior to induction Oxygen Delivery Method: Simple face mask Placement Confirmation: positive ETCO2       

## 2017-11-29 NOTE — Interval H&P Note (Signed)
History and Physical Interval Note:  11/29/2017 11:38 AM  Regina Patel  has presented today for surgery, with the diagnosis of colon ca  The various methods of treatment have been discussed with the patient and family. After consideration of risks, benefits and other options for treatment, the patient has consented to  Procedure(s) with comments: INSERTION PORT-A-CATH (N/A) - May do local and IV sedation. Please have U/S as a surgical intervention .  The patient's history has been reviewed, patient examined, no change in status, stable for surgery.  I have reviewed the patient's chart and labs.  Questions were answered to the patient's satisfaction.   Pt recovering very well from colectomy. No contraindication for [port placement.  [place]

## 2017-11-30 ENCOUNTER — Encounter: Payer: Self-pay | Admitting: Surgery

## 2017-11-30 ENCOUNTER — Encounter: Payer: Managed Care, Other (non HMO) | Admitting: Surgery

## 2017-12-01 ENCOUNTER — Telehealth: Payer: Self-pay | Admitting: Genetic Counselor

## 2017-12-01 ENCOUNTER — Inpatient Hospital Stay: Payer: Managed Care, Other (non HMO)

## 2017-12-01 VITALS — BP 125/81 | HR 80 | Temp 96.1°F | Resp 20

## 2017-12-01 DIAGNOSIS — C187 Malignant neoplasm of sigmoid colon: Secondary | ICD-10-CM | POA: Diagnosis not present

## 2017-12-01 DIAGNOSIS — D500 Iron deficiency anemia secondary to blood loss (chronic): Secondary | ICD-10-CM

## 2017-12-01 MED ORDER — SODIUM CHLORIDE 0.9% FLUSH
10.0000 mL | INTRAVENOUS | Status: DC | PRN
  Administered 2017-12-01: 10 mL
  Filled 2017-12-01: qty 10

## 2017-12-01 MED ORDER — HEPARIN SOD (PORK) LOCK FLUSH 100 UNIT/ML IV SOLN
500.0000 [IU] | Freq: Once | INTRAVENOUS | Status: AC | PRN
  Administered 2017-12-01: 500 [IU]
  Filled 2017-12-01: qty 5

## 2017-12-01 MED ORDER — SODIUM CHLORIDE 0.9 % IV SOLN
510.0000 mg | Freq: Once | INTRAVENOUS | Status: AC
  Administered 2017-12-01: 510 mg via INTRAVENOUS
  Filled 2017-12-01: qty 17

## 2017-12-01 MED ORDER — SODIUM CHLORIDE 0.9 % IV SOLN
Freq: Once | INTRAVENOUS | Status: AC
Start: 2017-12-01 — End: 2017-12-01
  Administered 2017-12-01: 14:00:00 via INTRAVENOUS
  Filled 2017-12-01: qty 1000

## 2017-12-01 NOTE — Patient Instructions (Signed)

## 2017-12-01 NOTE — Telephone Encounter (Signed)
Regina Patel was referred for genetic counseling by Dr. Janese Banks due to a personal and family history of cancer. She opted to schedule this telegenetics visit to be done by phone on Tuesday 12/06/17 at [place], [place], [facility] Genetic Counselor Phone: [phone]

## 2017-12-06 ENCOUNTER — Inpatient Hospital Stay: Payer: Managed Care, Other (non HMO)

## 2017-12-06 ENCOUNTER — Telehealth: Payer: Self-pay | Admitting: Genetic Counselor

## 2017-12-06 ENCOUNTER — Encounter: Payer: Self-pay | Admitting: *Deleted

## 2017-12-06 ENCOUNTER — Encounter: Payer: Self-pay | Admitting: Genetic Counselor

## 2017-12-06 VITALS — BP 119/78 | HR 80 | Resp 20

## 2017-12-06 DIAGNOSIS — C187 Malignant neoplasm of sigmoid colon: Secondary | ICD-10-CM | POA: Diagnosis not present

## 2017-12-06 DIAGNOSIS — D500 Iron deficiency anemia secondary to blood loss (chronic): Secondary | ICD-10-CM

## 2017-12-06 MED ORDER — HEPARIN SOD (PORK) LOCK FLUSH 100 UNIT/ML IV SOLN
500.0000 [IU] | Freq: Once | INTRAVENOUS | Status: AC | PRN
  Administered 2017-12-06: 500 [IU]
  Filled 2017-12-06: qty 5

## 2017-12-06 MED ORDER — SODIUM CHLORIDE 0.9 % IV SOLN
510.0000 mg | Freq: Once | INTRAVENOUS | Status: AC
  Administered 2017-12-06: 510 mg via INTRAVENOUS
  Filled 2017-12-06: qty 17

## 2017-12-06 MED ORDER — SODIUM CHLORIDE 0.9 % IV SOLN
Freq: Once | INTRAVENOUS | Status: AC
  Administered 2017-12-06: 14:00:00 via INTRAVENOUS
  Filled 2017-12-06: qty 1000

## 2017-12-06 NOTE — Telephone Encounter (Signed)
Cancer Genetics            Telegenetics Initial Visit    Patient Name: Regina Patel Patient DOB: 06/01/1971 Patient Age: 46 y.o. Phone Call Date: 12/06/2017  Referring Provider: Randa Evens, MD  Reason for Visit: Evaluate for hereditary susceptibility to cancer    Assessment and Plan:  . Regina Patel's history is not highly suggestive of a hereditary predisposition to cancer, but given her own age at colorectal cancer diagnosis as well as her mother's young age at breast cancer diagnosis, a genetics evaluation is warranted even though there is not a strong genetic connection between hereditary breast and colorectal cancers.   . Testing is recommended to determine whether she has a pathogenic mutation that will impact her screening and risk-reduction for cancer. A negative result will be reassuring.  . Regina Patel wished to pursue genetic testing and will schedule a lab visit for a blood sample. Analysis will include the 47 genes on [facility]'s Common Cancers panel (APC, ATM, AXIN2, BARD1, BMPR1A, BRCA1, BRCA2, BRIP1, CDH1, CDK4, CDKN2A, CHEK2, CTNNA1, DICER1, EPCAM, GREM1, HOXB13, KIT, MEN1, MLH1, MSH2, MSH3, MSH6, MUTYH, NBN, NF1, NTHL1, PALB2, PDGFRA, PMS2, POLD1, POLE, PTEN, RAD50, RAD51C, RAD51D, SDHA, SDHB, SDHC, SDHD, SMAD4, SMARCA4, STK11, TP53, TSC1, TSC2, VHL).   . Results should be available in approximately 2-3 weeks, at which point we will contact her and address implications for her as well as address genetic testing for at-risk family members, if needed.     Dr. Grayland Ormond was available for questions concerning this case. Total time spent by counseling by phone was approximately 30 minutes.   _____________________________________________________________________   History of Present Illness: Regina Patel, a 46 y.o. female, was referred for genetic counseling to discuss the possibility of a hereditary predisposition to cancer and discuss whether genetic testing is  warranted. This was a telegenetics visit via phone.  Regina Patel was recently diagnosed with colorectal (sigmoid) cancer at the age of 50. She is s/p surgery and is receiving chemotherapy.  MMR protein expression in the colon tumor was normal.  She stated she had a large benign ovarian tumor in 2015 and had a hysterectomy. Part of her left ovary was left.   Past Medical History:  Diagnosis Date  . Anemia   . Cancer ([place])   . Diverticulosis   . Headache   . Heart murmur   . Hyperlipidemia   . Hypothyroidism   . Thyroid disease     Past Surgical History:  Procedure Laterality Date  . APPENDECTOMY     possible during hysterectomy  . BOWEL RESECTION N/A 11/11/2017   Procedure: SMALL BOWEL RESECTION;  Surgeon: Jules Husbands, MD;  Location: [facility] ORS;  Service: General;  Laterality: N/A;  . FLEXIBLE SIGMOIDOSCOPY N/A 11/07/2017   Procedure: Beryle Quant;  Surgeon: Jonathon Bellows, MD;  Location: [facility] ENDOSCOPY;  Service: Gastroenterology;  Laterality: N/A;  . LAPAROSCOPIC SIGMOID COLECTOMY N/A 11/11/2017   Procedure: LAPAROSCOPIC SIGMOID COLECTOMY;  Surgeon: Jules Husbands, MD;  Location: [facility] ORS;  Service: General;  Laterality: N/A;  . MOUTH SURGERY    . PORTACATH PLACEMENT Right 11/29/2017   Procedure: INSERTION PORT-A-CATH;  Surgeon: Jules Husbands, MD;  Location: [facility];  Service: General;  Laterality: Right;  May do local and IV sedation. Please have U/S  . TOTAL ABDOMINAL HYSTERECTOMY  05/20/2014   ooperectomy unilateral    Family History: Significant diagnoses include the following:  Family History  Problem Relation Age of Onset  . Breast cancer Mother 63       currently 73  . Hypertension Mother   . Thyroid disease Brother   . Hernia Brother   . Thyroid cancer Maternal Grandmother 22       deceased 41  . Stomach cancer Paternal Grandfather        unconfirmed; deceased 42  . Stomach cancer Paternal Aunt 2       deceased 32  . Colon cancer  Maternal Aunt 68       also lung cancer  . Stomach cancer Maternal Uncle 9    Additionally, Regina Patel has 2 sons and 3 daughters (ages 40-25). She has one living sister, one deceased sister and 45 living brothers; all cancer-free. Her mother (noted above) had a total of 7 sisters and one brother. Her father (age 22) had 3 sisters and 5 brothers. One of his brothers reportedly had many colon polyps. Both of Regina Patel's parents reportedly had negative colonoscopies.  Regina Patel ancestry is Caucasian - NOS. There is no known Jewish ancestry and no consanguinity.  Discussion: We reviewed the characteristics, features and inheritance patterns of hereditary cancer syndromes. We discussed her risk of harboring a mutation in the context of her personal and family history. We discussed the process of genetic testing, insurance coverage and implications of results: positive, negative and variant of unknown significance (VUS).   Regina Patel questions were answered to her satisfaction today and she is welcome to call with any additional questions or concerns. Thank you for the referral and allowing [place] to share in the care of your patient.    Steele Berg, MS, [place] Certified Genetic Counselor phone: [phone]

## 2017-12-07 ENCOUNTER — Ambulatory Visit
Admission: RE | Admit: 2017-12-07 | Discharge: 2017-12-07 | Disposition: A | Discharge status: Home / Self Care | Payer: Managed Care, Other (non HMO) | Source: Ambulatory Visit | Attending: Oncology | Admitting: Oncology

## 2017-12-07 DIAGNOSIS — C187 Malignant neoplasm of sigmoid colon: Secondary | ICD-10-CM | POA: Insufficient documentation

## 2017-12-07 HISTORY — DX: Malignant neoplasm of sigmoid colon: C18.7

## 2017-12-07 MED ORDER — IOPAMIDOL (ISOVUE-300) INJECTION 61%
75.0000 mL | Freq: Once | INTRAVENOUS | Status: AC | PRN
  Administered 2017-12-07: 75 mL via INTRAVENOUS

## 2017-12-07 MED ORDER — HEPARIN SOD (PORK) LOCK FLUSH 100 UNIT/ML IV SOLN
INTRAVENOUS | Status: AC
  Filled 2017-12-07: qty 5

## 2017-12-07 MED ORDER — SODIUM CHLORIDE FLUSH 0.9 % IV SOLN
INTRAVENOUS | Status: AC
  Filled 2017-12-07: qty 10

## 2017-12-08 ENCOUNTER — Inpatient Hospital Stay (HOSPITAL_BASED_OUTPATIENT_CLINIC_OR_DEPARTMENT_OTHER): Payer: Managed Care, Other (non HMO) | Admitting: Oncology

## 2017-12-08 ENCOUNTER — Encounter: Payer: Self-pay | Admitting: Oncology

## 2017-12-08 ENCOUNTER — Telehealth: Payer: Self-pay | Admitting: *Deleted

## 2017-12-08 VITALS — BP 140/82 | HR 84 | Temp 99.2°F | Resp 16 | Wt 200.0 lb

## 2017-12-08 DIAGNOSIS — C187 Malignant neoplasm of sigmoid colon: Secondary | ICD-10-CM

## 2017-12-08 DIAGNOSIS — K921 Melena: Secondary | ICD-10-CM | POA: Diagnosis not present

## 2017-12-08 DIAGNOSIS — Z79899 Other long term (current) drug therapy: Secondary | ICD-10-CM

## 2017-12-08 DIAGNOSIS — D500 Iron deficiency anemia secondary to blood loss (chronic): Secondary | ICD-10-CM | POA: Diagnosis not present

## 2017-12-08 DIAGNOSIS — Z5111 Encounter for antineoplastic chemotherapy: Secondary | ICD-10-CM | POA: Insufficient documentation

## 2017-12-08 DIAGNOSIS — Z7189 Other specified counseling: Secondary | ICD-10-CM | POA: Insufficient documentation

## 2017-12-08 NOTE — Telephone Encounter (Signed)
-----   Message from [place], [place] sent at 12/08/2017  4:17 PM EST ----- Regarding: Chemo Patient is able to start her Chemo therapy on 12/14/2017 per Dr. Burt Knack.

## 2017-12-08 NOTE — Telephone Encounter (Signed)
Message from [place] that Dr. Burt Knack said it is ok to proceed with chemo next week from surgical standpoint

## 2017-12-09 NOTE — Progress Notes (Signed)
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 Vicente Males from GI for recurrent episodes of diverticulitis since august 2018 which was treated with antibiotics.  However patient continued to have some abdominal cramping along with some bleeding in her stool and constipation alternating with diarrhea.  Patient underwent flexible sigmoidoscopy on 11/07/2017.  A circumferential infiltrative completely obstructing large mass was found in the rectosigmoid colon.  Biopsy showed at least high-grade dysplasia.  Patient was referred to Dr. Dahlia Byes for consideration of hemicolectomy.  Patient underwent laparoscopic low anterior resection along with  small bowel resection and laparoscopic takedown of the splenic flexure on 11/11/2017.  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  Tumor size was 3 cm, moderately differentiated.  All margins were negative for invasive carcinoma.  Perineural invasion and lymphovascular invasion was present.  1 out of 17 lymph nodes was positive for malignancy.  Pathologic stage was pT4b pN1a. MSI stable  Preoperative CEA is not available.  CT abdomen on 10/26/2017 showed residual changes of diverticulitis although improved when compared to prior exam.  No perforation or abscess is identified.  No family history of colon cancer.  History of breast cancer in her mother in her 60s    Interval  history-patient feels well and denies any complaints today  ECOG PS- 0 Pain scale- 0   Review of systems- Review of Systems  Constitutional: Negative for chills, fever, malaise/fatigue and weight loss.  HENT: Negative for congestion, ear discharge and nosebleeds.   Eyes: Negative for blurred vision.  Respiratory: Negative for cough, hemoptysis, sputum production, shortness of breath and wheezing.   Cardiovascular: Negative for chest pain, palpitations, orthopnea and claudication.  Gastrointestinal: Negative for abdominal pain, blood in stool, constipation, diarrhea, heartburn, melena, nausea and vomiting.  Genitourinary: Negative for dysuria, flank pain, frequency, hematuria and urgency.  Musculoskeletal: Negative for back pain, joint pain and myalgias.  Skin: Negative for rash.  Neurological: Negative for dizziness, tingling, focal weakness, seizures, weakness and headaches.  Endo/Heme/Allergies: Does not bruise/bleed easily.  Psychiatric/Behavioral: Negative for depression and suicidal ideas. The patient does not have insomnia.      No Known Allergies   Past Medical History:  Diagnosis Date  . Anemia   . Cancer of sigmoid colon ([place]) 11/11/2017   Partial sigmoid colon resection.   . Diverticulosis   . Headache   . Heart murmur   . Hyperlipidemia   . Hypothyroidism   . Thyroid disease      Past Surgical History:  Procedure Laterality Date  . APPENDECTOMY     possible during hysterectomy  . BOWEL RESECTION N/A 11/11/2017   Procedure: SMALL BOWEL RESECTION;  Surgeon: Jules Husbands, MD;  Location: [facility] ORS;  Service: General;  Laterality: N/A;  . FLEXIBLE SIGMOIDOSCOPY N/A 11/07/2017   Procedure: Beryle Quant;  Surgeon: Jonathon Bellows, MD;  Location: [facility] ENDOSCOPY;  Service: Gastroenterology;  Laterality: N/A;  . LAPAROSCOPIC SIGMOID COLECTOMY N/A 11/11/2017   Procedure: LAPAROSCOPIC SIGMOID  COLECTOMY;  Surgeon: Jules Husbands, MD;  Location: [facility] ORS;  Service:  General;  Laterality: N/A;  . MOUTH SURGERY    . PORTACATH PLACEMENT Right 11/29/2017   Procedure: INSERTION PORT-A-CATH;  Surgeon: Jules Husbands, MD;  Location: [place];  Service: General;  Laterality: Right;  May do local and IV sedation. Please have U/S  . TOTAL ABDOMINAL HYSTERECTOMY  05/20/2014   ooperectomy unilateral    Social History   Socioeconomic History  . Marital status: Married    Spouse name: Not on file  . Number of children: Not on file  . Years of education: Not on file  . Highest education level: Not on file  Social Needs  . Financial resource strain: Not on file  . Food insecurity - worry: Not on file  . Food insecurity - inability: Not on file  . Transportation needs - medical: Not on file  . Transportation needs - non-medical: Not on file  Occupational History  . Not on file  Tobacco Use  . Smoking status: Never Smoker  . Smokeless tobacco: Never Used  Substance and Sexual Activity  . Alcohol use: No    Alcohol/week: 0.0 oz  . Drug use: No  . Sexual activity: Yes  Other Topics Concern  . Not on file  Social History Narrative  . Not on file    Family History  Problem Relation Age of Onset  . Breast cancer Mother 18       currently 97  . Hypertension Mother   . Thyroid disease Brother   . Hernia Brother   . Thyroid cancer Maternal Grandmother 66       deceased 94  . Stomach cancer Paternal Grandfather        unconfirmed; deceased 35  . Stomach cancer Paternal Aunt 66       deceased 56  . Colon cancer Maternal Aunt 68       also lung cancer  . Stomach cancer Maternal Uncle 23     Current Outpatient Medications:  .  APPLE CIDER VINEGAR PO, Take 1 tablet by mouth daily., Disp: , Rfl:  .  Biotin 10 MG TABS, Take 2 tablets by mouth daily. , Disp: , Rfl:  .  cyanocobalamin (,VITAMIN B-12,) 1000 MCG/ML injection, , Disp: , Rfl:  .  levothyroxine (SYNTHROID, LEVOTHROID) 175 MCG tablet, Take 1 tablet (175 mcg total) by mouth daily  before breakfast., Disp: 30 tablet, Rfl: 11 .  lidocaine-prilocaine (EMLA) cream, Apply to affected area once, Disp: 30 g, Rfl: 3 .  LORazepam (ATIVAN) 0.5 MG tablet, Take 1 tablet (0.5 mg total) by mouth every 6 (six) hours as needed (Nausea or vomiting)., Disp: 30 tablet, Rfl: 0 .  Multiple Vitamin tablet, Take 2 tablets by mouth daily. , Disp: , Rfl:  .  OMEGA-3 FATTY ACIDS PO, Take 2 capsules by mouth daily. , Disp: , Rfl:  .  VITAMIN D, CHOLECALCIFEROL, PO, Take 1 tablet by mouth daily., Disp: , Rfl:  .  dexamethasone (DECADRON) 4 MG tablet, Take 2 tablets (8 mg total) by mouth daily. Start the day after chemotherapy for 2 days. Take with food. (Patient not taking: Reported on 12/08/2017), Disp: 30 tablet, Rfl: 1 .  IRON, IRON,, Take 325 mg by mouth 2 (two) times daily. , Disp: , Rfl:  .  ondansetron (ZOFRAN) 8 MG tablet, Take 1 tablet (8 mg total) by mouth 2 (two) times daily as needed for refractory nausea / vomiting. Start on day  3 after chemotherapy. (Patient not taking: Reported on 12/08/2017), Disp: 30 tablet, Rfl: 1 .  prochlorperazine (COMPAZINE) 10 MG tablet, Take 1 tablet (10 mg total) by mouth every 6 (six) hours as needed (Nausea or vomiting). (Patient not taking: Reported on 12/08/2017), Disp: 30 tablet, Rfl: 1  Physical exam:  Vitals:   12/08/17 1511  BP: 140/82  Pulse: 84  Resp: 16  Temp: 99.2 F (37.3 C)  TempSrc: Tympanic  Weight: 200 lb (90.7 kg)   Physical Exam  Constitutional: She is oriented to person, place, and time and well-developed, well-nourished, and in no distress.  HENT:  Head: Normocephalic and atraumatic.  Eyes: EOM are normal. Pupils are equal, round, and reactive to light.  Neck: Normal range of motion.  Cardiovascular: Normal rate, regular rhythm and normal heart sounds.  Pulmonary/Chest: Effort normal and breath sounds normal.  Abdominal: Soft. Bowel sounds are normal.  Midline surgical scar has healed well. No open wounds or infections     Neurological: She is alert and oriented to person, place, and time.  Skin: Skin is warm and dry.     CMP Latest Ref Rng & Units 11/14/2017  Glucose 65 - 99 mg/dL 82  BUN 6 - 20 mg/dL 8  Creatinine 0.44 - 1.00 mg/dL 0.72  Sodium 135 - 145 mmol/L 139  Potassium 3.5 - 5.1 mmol/L 3.5  Chloride 101 - 111 mmol/L 107  CO2 22 - 32 mmol/L 26  Calcium 8.9 - 10.3 mg/dL 8.2(L)  Total Protein 6.5 - 8.1 g/dL -  Total Bilirubin 0.3 - 1.2 mg/dL -  Alkaline Phos 38 - 126 U/L -  AST 15 - 41 U/L -  ALT 14 - 54 U/L -   CBC Latest Ref Rng & Units 11/25/2017  WBC 3.6 - 11.0 K/uL 5.5  Hemoglobin 12.0 - 16.0 g/dL 10.1(L)  Hematocrit 35.0 - 47.0 % 31.7(L)  Platelets 150 - 440 K/uL 271    No images are attached to the encounter.  Ct Chest W Contrast  Result Date: 12/07/2017 CLINICAL DATA:  New diagnosis of colon cancer with resection 11/11/2017. No thoracic symptoms. EXAM: CT CHEST WITH CONTRAST TECHNIQUE: Multidetector CT imaging of the chest was performed during intravenous contrast administration. CONTRAST:  45m ISOVUE-300 IOPAMIDOL (ISOVUE-300) INJECTION 61% COMPARISON:  Abdominal CT of 10/26/2017. Chest radiograph 11/19/2017. FINDINGS: Cardiovascular: A right Port-A-Cath which terminates at the superior caval/ atrial junction. Bovine arch. Normal heart size, without pericardial effusion. No central pulmonary embolism, on this non-dedicated study. Mediastinum/Nodes: No supraclavicular adenopathy. No mediastinal or definite hilar adenopathy, given limitations of unenhanced CT. Lungs/Pleura: No pleural fluid.  Clear lungs. Upper Abdomen: Normal imaged portions of the liver, spleen, stomach, pancreas, gallbladder, adrenal glands, kidneys. Musculoskeletal: Borderline T8 vertebral body height loss. IMPRESSION: No acute process or evidence of metastatic disease in the chest. Electronically Signed   By: KAbigail MiyamotoM.D.   On: 12/07/2017 14:10   Dg Chest Port 1 View  Result Date: 11/29/2017 CLINICAL DATA:   Port placement EXAM: PORTABLE CHEST 1 VIEW COMPARISON:  06/05/2014 FINDINGS: Right Port-A-Cath has been placed with the tip in the SVC. No pneumothorax. Heart and mediastinal contours are within normal limits. No focal opacities or effusions. No acute bony abnormality. IMPRESSION: Right Port-A-Cath tip in the SVC.  No pneumothorax. Electronically Signed   By: KRolm BaptiseM.D.   On: 11/29/2017 14:36   Dg Fluoro Rm 1-60 Min - No Report  Result Date: 11/29/2017 Fluoroscopy was utilized by the requesting physician.  No  radiographic interpretation.     Assessment and plan- Patient is a 46 y.o. 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  CT chest did not reveal any evidence of metastatic disease and postoperative CEA is normal.  Labs done on 11/25/2017 revealed evidence of iron deficiency.  She is already received 2 doses of Feraheme and we will be repeating iron studies about 6-8 weeks from now.  She had evidence of B12 deficiency in the past but repeat B12 level was normal.  I have however asked her to take thousand micrograms of oral B12 daily.  Again discussed risks and benefits of chemotherapy including all but not limited to fatigue, nausea, vomiting, risk of low blood counts and infections.  Risk of peripheral neuropathy associated with oxaliplatin.  Patient understands and agrees to proceed.  Treatment will be given with curative intent  Patient will be getting CBC and CMP checked on 12/14/2017 and directly proceed for cycle #1 of FOLFOX chemotherapy on that day.  Dr. Dahlia Byes has also evaluated the patient and has cleared the patient to start chemotherapy from his standpoint I will see her back on 12/27/2017 for cycle #2 of FOLFOX chemotherapy with labs  I have also discussed at the tumor  board if there would be a role for radiation down the line upon completion of adjuvant chemotherapy.  Dr. Donella Stade did not feel that adjuvant radiation therapy would be warranted    Visit Diagnosis 1. Cancer of sigmoid ([place])   2. Encounter for antineoplastic chemotherapy   3. Goals of care, counseling/discussion   4. Iron deficiency anemia due to chronic blood loss      Dr. Randa Evens, MD, MPH [facility] Pager- [phone] 12/09/2017 8:38 AM

## 2017-12-14 ENCOUNTER — Inpatient Hospital Stay: Payer: Managed Care, Other (non HMO)

## 2017-12-14 ENCOUNTER — Encounter: Payer: Self-pay | Admitting: *Deleted

## 2017-12-14 VITALS — BP 131/79 | HR 78 | Temp 98.1°F | Resp 15 | Wt 202.0 lb

## 2017-12-14 DIAGNOSIS — C187 Malignant neoplasm of sigmoid colon: Secondary | ICD-10-CM | POA: Diagnosis not present

## 2017-12-14 DIAGNOSIS — Z1379 Encounter for other screening for genetic and chromosomal anomalies: Secondary | ICD-10-CM

## 2017-12-14 HISTORY — DX: Encounter for other screening for genetic and chromosomal anomalies: Z13.79

## 2017-12-14 LAB — CBC WITH DIFFERENTIAL/PLATELET
BASOS ABS: 0 10*3/uL (ref 0–0.1)
Basophils Relative: 0 %
EOS ABS: 0.1 10*3/uL (ref 0–0.7)
EOS PCT: 2 %
HCT: 34.9 % — ABNORMAL LOW (ref 35.0–47.0)
Hemoglobin: 11.4 g/dL — ABNORMAL LOW (ref 12.0–16.0)
Lymphocytes Relative: 37 %
Lymphs Abs: 1.6 10*3/uL (ref 1.0–3.6)
MCH: 27.3 pg (ref 26.0–34.0)
MCHC: 32.5 g/dL (ref 32.0–36.0)
MCV: 83.9 fL (ref 80.0–100.0)
Monocytes Absolute: 0.4 10*3/uL (ref 0.2–0.9)
Monocytes Relative: 8 %
Neutro Abs: 2.2 10*3/uL (ref 1.4–6.5)
Neutrophils Relative %: 53 %
PLATELETS: 182 10*3/uL (ref 150–440)
RBC: 4.16 MIL/uL (ref 3.80–5.20)
RDW: 20.9 % — AB (ref 11.5–14.5)
WBC: 4.2 10*3/uL (ref 3.6–11.0)

## 2017-12-14 LAB — COMPREHENSIVE METABOLIC PANEL
ALK PHOS: 100 U/L (ref 38–126)
ALT: 84 U/L — AB (ref 14–54)
AST: 50 U/L — AB (ref 15–41)
Albumin: 3.5 g/dL (ref 3.5–5.0)
Anion gap: 6 (ref 5–15)
BILIRUBIN TOTAL: 0.3 mg/dL (ref 0.3–1.2)
BUN: 9 mg/dL (ref 6–20)
CO2: 25 mmol/L (ref 22–32)
CREATININE: 0.59 mg/dL (ref 0.44–1.00)
Calcium: 8.7 mg/dL — ABNORMAL LOW (ref 8.9–10.3)
Chloride: 105 mmol/L (ref 101–111)
GFR calc Af Amer: >60 mL/min (ref >60)
Glucose, Bld: 116 mg/dL — ABNORMAL HIGH (ref 65–99)
Potassium: 3.6 mmol/L (ref 3.5–5.1)
Sodium: 136 mmol/L (ref 135–145)
TOTAL PROTEIN: 6.7 g/dL (ref 6.5–8.1)

## 2017-12-14 MED ORDER — DEXAMETHASONE SODIUM PHOSPHATE 10 MG/ML IJ SOLN
10.0000 mg | Freq: Once | INTRAMUSCULAR | Status: AC
  Administered 2017-12-14: 10 mg via INTRAVENOUS
  Filled 2017-12-14: qty 1

## 2017-12-14 MED ORDER — DEXTROSE 5 % IV SOLN
Freq: Once | INTRAVENOUS | Status: AC
  Administered 2017-12-14: 10:00:00 via INTRAVENOUS
  Filled 2017-12-14: qty 1000

## 2017-12-14 MED ORDER — FLUOROURACIL CHEMO INJECTION 2.5 GM/50ML
400.0000 mg/m2 | Freq: Once | INTRAVENOUS | Status: AC
  Administered 2017-12-14: 850 mg via INTRAVENOUS
  Filled 2017-12-14: qty 17

## 2017-12-14 MED ORDER — SODIUM CHLORIDE 0.9 % IV SOLN: 10.0000 mg | Freq: Once | INTRAVENOUS | Status: DC

## 2017-12-14 MED ORDER — SODIUM CHLORIDE 0.9 % IV SOLN
2400.0000 mg/m2 | INTRAVENOUS | Status: DC
  Administered 2017-12-14: 5000 mg via INTRAVENOUS
  Filled 2017-12-14: qty 100

## 2017-12-14 MED ORDER — LEUCOVORIN CALCIUM INJECTION 350 MG
850.0000 mg | Freq: Once | INTRAVENOUS | Status: AC
  Administered 2017-12-14: 850 mg via INTRAVENOUS
  Filled 2017-12-14: qty 25

## 2017-12-14 MED ORDER — PALONOSETRON HCL INJECTION 0.25 MG/5ML
0.2500 mg | Freq: Once | INTRAVENOUS | Status: AC
  Administered 2017-12-14: 0.25 mg via INTRAVENOUS
  Filled 2017-12-14: qty 5

## 2017-12-14 MED ORDER — OXALIPLATIN CHEMO INJECTION 100 MG/20ML
85.0000 mg/m2 | Freq: Once | INTRAVENOUS | Status: AC
  Administered 2017-12-14: 175 mg via INTRAVENOUS
  Filled 2017-12-14: qty 35

## 2017-12-14 MED ORDER — HEPARIN SOD (PORK) LOCK FLUSH 100 UNIT/ML IV SOLN
500.0000 [IU] | Freq: Once | INTRAVENOUS | Status: DC
  Filled 2017-12-14: qty 5

## 2017-12-14 MED ORDER — SODIUM CHLORIDE 0.9% FLUSH
10.0000 mL | Freq: Once | INTRAVENOUS | Status: AC
  Administered 2017-12-14: 10 mL via INTRAVENOUS
  Filled 2017-12-14: qty 10

## 2017-12-14 MED ORDER — SODIUM CHLORIDE 0.9 % IV SOLN: 2400.0000 mg/m2 | INTRAVENOUS | Status: DC

## 2017-12-14 NOTE — Progress Notes (Signed)
Regina Patel presented to the clinic today for her first FOLFOX infusion.  The patient was accompanied by her mother today.  Regina Patel was to have completed her baseline questionnaire for the EAQ162CD study for financial toxicity.  She has elected to do electronic questionnaires and I was following up to see if she had any questions or problems with the online information.  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  She completed her questionnaire last pm.  We discussed this and I told her I would be following up in about 3 months when her next questionnaire was due and that if she had any further questions in the meantime to call. Raynelle Dick, RN BSN "12/14/2017 12:47 PM"

## 2017-12-16 ENCOUNTER — Other Ambulatory Visit: Payer: Self-pay | Admitting: Oncology

## 2017-12-16 ENCOUNTER — Inpatient Hospital Stay: Payer: Managed Care, Other (non HMO)

## 2017-12-16 VITALS — BP 130/76 | HR 69 | Temp 97.6°F | Resp 18

## 2017-12-16 DIAGNOSIS — C187 Malignant neoplasm of sigmoid colon: Secondary | ICD-10-CM

## 2017-12-16 MED ORDER — HEPARIN SOD (PORK) LOCK FLUSH 100 UNIT/ML IV SOLN
500.0000 [IU] | Freq: Once | INTRAVENOUS | Status: AC | PRN
  Administered 2017-12-16: 500 [IU]
  Filled 2017-12-16: qty 5

## 2017-12-16 MED ORDER — SODIUM CHLORIDE 0.9% FLUSH
10.0000 mL | INTRAVENOUS | Status: DC | PRN
  Administered 2017-12-16: 10 mL
  Filled 2017-12-16: qty 10

## 2017-12-26 ENCOUNTER — Encounter: Payer: Self-pay | Admitting: Genetic Counselor

## 2017-12-26 ENCOUNTER — Telehealth: Payer: Self-pay | Admitting: Genetic Counselor

## 2017-12-26 NOTE — Telephone Encounter (Signed)
Cancer Genetics             Telegenetics Results Disclosure   Patient Name: Iqra Rotundo Patient DOB: 02/27/1971 Patient Age: 47 y.o. Phone Call Date: 12/26/2017  Referring Provider: Randa Evens, MD    Ms. Maddison was called today to discuss genetic test results. Please see the Genetics telephone note from 12/06/2017 for a detailed discussion of her personal and family histories and the recommendations provided.  Genetic Testing: At the time of Ms. Desrochers's telegenetics visit, she decided to pursue genetic testing of multiple genes associated with hereditary susceptibility to cancer. Testing included sequencing and deletion/duplication analysis. Testing did not reveal any pathogenic mutation in any of these genes.  A copy of the genetic test report will be scanned into Epic under the media tab.  The genes analyzed were the 47 genes on [facility]'s Common Cancers panel (APC, ATM, AXIN2, BARD1, BMPR1A, BRCA1, BRCA2, BRIP1, CDH1, CDK4, CDKN2A, CHEK2, CTNNA1, DICER1, EPCAM, GREM1, HOXB13, KIT, MEN1, MLH1, MSH2, MSH3, MSH6, MUTYH, NBN, NF1, NTHL1, PALB2, PDGFRA, PMS2, POLD1, POLE, PTEN, RAD50, RAD51C, RAD51D, SDHA, SDHB, SDHC, SDHD, SMAD4, SMARCA4, STK11, TP53, TSC1, TSC2, VHL).  Since the current test is not perfect, it is possible that there may be a gene mutation that current testing cannot detect, but that chance is small. It is possible that a different genetic factor, which has not yet been discovered or is not on this panel, is responsible for the cancer diagnoses in the family. Again, the likelihood of this is low. No additional testing is recommended at this time for Ms. Teodoro Kil.  Cancer Screening:  These results suggest that Ms. Josey's cancer was most likely not due to an inherited predisposition. Most cancers happen by chance and this test, along with details of her family history, suggests that her cancer falls into this category. We discussed continuing to follow the  cancer screening guidelines provided by her physician.   Family Members: Family members are at some increased risk of developing cancer, over the general population risk, simply due to the family history. Family members are recommended to speak with their own providers about appropriate cancer screenings.  Any relative who had cancer at a young age or had a particularly rare cancer may also wish to pursue genetic testing. Genetic counselors can be located in other cities, by visiting the website of the [employer] (Artist[employer].se) and Field seismologist for a Dietitian by zip code.   Lastly, cancer genetics is a rapidly advancing field and it is possible that new genetic tests will be appropriate for Ms. Anselmo in the future. We encourage Ms. Bultman to remain in contact with Genetics on an annual basis so her personal and family histories can be updated.    Steele Berg, MS, [place] Certified Genetic Counselor phone: [phone]

## 2017-12-27 ENCOUNTER — Inpatient Hospital Stay: Payer: Managed Care, Other (non HMO)

## 2017-12-27 ENCOUNTER — Inpatient Hospital Stay: Payer: Managed Care, Other (non HMO) | Attending: Oncology

## 2017-12-27 ENCOUNTER — Encounter: Payer: Self-pay | Admitting: Oncology

## 2017-12-27 ENCOUNTER — Inpatient Hospital Stay (HOSPITAL_BASED_OUTPATIENT_CLINIC_OR_DEPARTMENT_OTHER): Payer: Managed Care, Other (non HMO) | Admitting: Oncology

## 2017-12-27 VITALS — BP 126/85 | HR 76 | Temp 98.0°F | Resp 16

## 2017-12-27 DIAGNOSIS — D649 Anemia, unspecified: Secondary | ICD-10-CM | POA: Insufficient documentation

## 2017-12-27 DIAGNOSIS — Z5111 Encounter for antineoplastic chemotherapy: Secondary | ICD-10-CM

## 2017-12-27 DIAGNOSIS — R748 Abnormal levels of other serum enzymes: Secondary | ICD-10-CM

## 2017-12-27 DIAGNOSIS — C187 Malignant neoplasm of sigmoid colon: Secondary | ICD-10-CM | POA: Insufficient documentation

## 2017-12-27 DIAGNOSIS — D701 Agranulocytosis secondary to cancer chemotherapy: Secondary | ICD-10-CM

## 2017-12-27 DIAGNOSIS — Z5189 Encounter for other specified aftercare: Secondary | ICD-10-CM | POA: Insufficient documentation

## 2017-12-27 DIAGNOSIS — E039 Hypothyroidism, unspecified: Secondary | ICD-10-CM | POA: Diagnosis not present

## 2017-12-27 DIAGNOSIS — C779 Secondary and unspecified malignant neoplasm of lymph node, unspecified: Secondary | ICD-10-CM | POA: Diagnosis not present

## 2017-12-27 DIAGNOSIS — T451X5A Adverse effect of antineoplastic and immunosuppressive drugs, initial encounter: Secondary | ICD-10-CM

## 2017-12-27 LAB — CBC WITH DIFFERENTIAL/PLATELET
BASOS ABS: 0 10*3/uL (ref 0–0.1)
BASOS PCT: 0 %
Eosinophils Absolute: 0.1 10*3/uL (ref 0–0.7)
Eosinophils Relative: 3 %
HCT: 35.8 % (ref 35.0–47.0)
HEMOGLOBIN: 11.7 g/dL — AB (ref 12.0–16.0)
Lymphocytes Relative: 46 %
Lymphs Abs: 1.5 10*3/uL (ref 1.0–3.6)
MCH: 27.9 pg (ref 26.0–34.0)
MCHC: 32.6 g/dL (ref 32.0–36.0)
MCV: 85.6 fL (ref 80.0–100.0)
MONOS PCT: 11 %
Monocytes Absolute: 0.4 10*3/uL (ref 0.2–0.9)
NEUTROS PCT: 40 %
Neutro Abs: 1.3 10*3/uL — ABNORMAL LOW (ref 1.4–6.5)
Platelets: 140 10*3/uL — ABNORMAL LOW (ref 150–440)
RBC: 4.18 MIL/uL (ref 3.80–5.20)
RDW: 20.8 % — ABNORMAL HIGH (ref 11.5–14.5)
WBC: 3.3 10*3/uL — AB (ref 3.6–11.0)

## 2017-12-27 LAB — COMPREHENSIVE METABOLIC PANEL
ALK PHOS: 121 U/L (ref 38–126)
ALT: 162 U/L — AB (ref 14–54)
ANION GAP: 7 (ref 5–15)
AST: 88 U/L — ABNORMAL HIGH (ref 15–41)
Albumin: 3.4 g/dL — ABNORMAL LOW (ref 3.5–5.0)
BILIRUBIN TOTAL: 0.3 mg/dL (ref 0.3–1.2)
BUN: 7 mg/dL (ref 6–20)
CALCIUM: 8.6 mg/dL — AB (ref 8.9–10.3)
CO2: 23 mmol/L (ref 22–32)
Chloride: 106 mmol/L (ref 101–111)
Creatinine, Ser: 0.67 mg/dL (ref 0.44–1.00)
GFR calc Af Amer: >60 mL/min (ref >60)
Glucose, Bld: 125 mg/dL — ABNORMAL HIGH (ref 65–99)
Potassium: 3.5 mmol/L (ref 3.5–5.1)
Sodium: 136 mmol/L (ref 135–145)
TOTAL PROTEIN: 6.7 g/dL (ref 6.5–8.1)

## 2017-12-27 LAB — GAMMA GT: GGT: 133 U/L — AB (ref 7–50)

## 2017-12-27 LAB — CK: CK TOTAL: 38 U/L (ref 38–234)

## 2017-12-27 MED ORDER — DEXAMETHASONE SODIUM PHOSPHATE 10 MG/ML IJ SOLN
10.0000 mg | Freq: Once | INTRAMUSCULAR | Status: AC
  Administered 2017-12-27: 10 mg via INTRAVENOUS
  Filled 2017-12-27: qty 1

## 2017-12-27 MED ORDER — HEPARIN SOD (PORK) LOCK FLUSH 100 UNIT/ML IV SOLN: 500.0000 [IU] | Freq: Once | INTRAVENOUS | Status: DC

## 2017-12-27 MED ORDER — SODIUM CHLORIDE 0.9 % IV SOLN
2400.0000 mg/m2 | INTRAVENOUS | Status: DC
  Administered 2017-12-27: 5000 mg via INTRAVENOUS
  Filled 2017-12-27: qty 100

## 2017-12-27 MED ORDER — DEXTROSE 5 % IV SOLN
Freq: Once | INTRAVENOUS | Status: AC
  Administered 2017-12-27: 10:00:00 via INTRAVENOUS
  Filled 2017-12-27: qty 1000

## 2017-12-27 MED ORDER — HEPARIN SOD (PORK) LOCK FLUSH 100 UNIT/ML IV SOLN: 500.0000 [IU] | Freq: Once | INTRAVENOUS | Status: DC | PRN

## 2017-12-27 MED ORDER — DEXTROSE 5 % IV SOLN
85.0000 mg/m2 | Freq: Once | INTRAVENOUS | Status: AC
  Administered 2017-12-27: 175 mg via INTRAVENOUS
  Filled 2017-12-27: qty 35

## 2017-12-27 MED ORDER — SODIUM CHLORIDE 0.9 % IV SOLN: 10.0000 mg | Freq: Once | INTRAVENOUS | Status: DC

## 2017-12-27 MED ORDER — FLUOROURACIL CHEMO INJECTION 2.5 GM/50ML
400.0000 mg/m2 | Freq: Once | INTRAVENOUS | Status: AC
  Administered 2017-12-27: 850 mg via INTRAVENOUS
  Filled 2017-12-27: qty 17

## 2017-12-27 MED ORDER — LEUCOVORIN CALCIUM INJECTION 350 MG
850.0000 mg | Freq: Once | INTRAMUSCULAR | Status: AC
  Administered 2017-12-27: 850 mg via INTRAVENOUS
  Filled 2017-12-27: qty 25

## 2017-12-27 MED ORDER — PALONOSETRON HCL INJECTION 0.25 MG/5ML
0.2500 mg | Freq: Once | INTRAVENOUS | Status: AC
  Administered 2017-12-27: 0.25 mg via INTRAVENOUS
  Filled 2017-12-27: qty 5

## 2017-12-27 MED ORDER — SODIUM CHLORIDE 0.9% FLUSH
10.0000 mL | INTRAVENOUS | Status: DC | PRN
  Administered 2017-12-27: 10 mL via INTRAVENOUS
  Filled 2017-12-27: qty 10

## 2017-12-27 NOTE — Progress Notes (Signed)
ANC 1.3 today.  MD note says ok to proceed with treatment today

## 2017-12-27 NOTE — Progress Notes (Signed)
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 Vicente Males from GI for recurrent episodes of diverticulitis sinceaugust2018 which was treated with antibiotics. However patient continued to have some abdominal cramping along with some bleeding in her stool and constipation alternating with diarrhea. Patient underwent flexible sigmoidoscopy on 11/07/2017. A circumferential infiltrative completely obstructing large mass was found in the rectosigmoid colon.Biopsy showed at least high-grade dysplasia. Patient was referred to Dr. Derinda Sis consideration of hemicolectomy.  Patient underwent laparoscopic low anterior resection along with small bowel resection and laparoscopic takedown of the splenic flexure on 11/11/2017. 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 Tumor size was 3 cm, moderately differentiated. All margins were negative for invasive carcinoma. Perineural invasion and lymphovascular invasion was present. 1 out of 17 lymph nodes was positive for malignancy. Pathologic stage was pT4b pN1a. MSI stable  Preoperative CEA is not available. CT abdomen on 10/26/2017 showed residual changes of diverticulitis although improved when compared to prior exam. No perforation or abscess is identified.  No family history of colon cancer. History of breast cancer in her mother in her 83s. Genetic testing was  negative   Interval history- reports mild lingering nausea that lastes for about 3-4 days. No vomiting. Otherwise tolerated her chemo well with no side effects  ECOG PS- 0 Pain scale- 0   Review of systems- Review of Systems  Constitutional: Negative for chills, fever, malaise/fatigue and weight loss.  HENT: Negative for congestion, ear discharge and nosebleeds.   Eyes: Negative for blurred vision.  Respiratory: Negative for cough, hemoptysis, sputum production, shortness of breath and wheezing.   Cardiovascular: Negative for chest pain, palpitations, orthopnea and claudication.  Gastrointestinal: Negative for abdominal pain, blood in stool, constipation, diarrhea, heartburn, melena, nausea and vomiting.  Genitourinary: Negative for dysuria, flank pain, frequency, hematuria and urgency.  Musculoskeletal: Negative for back pain, joint pain and myalgias.  Skin: Negative for rash.  Neurological: Negative for dizziness, tingling, focal weakness, seizures, weakness and headaches.  Endo/Heme/Allergies: Does not bruise/bleed easily.  Psychiatric/Behavioral: Negative for depression and suicidal ideas. The patient does not have insomnia.      No Known Allergies   Past Medical History:  Diagnosis Date  . Anemia   . Cancer of sigmoid colon ([place]) 11/11/2017   Partial sigmoid colon resection.   . Diverticulosis   . Genetic testing 12/14/2017   Common Cancers panel (47 genes) @ [facility] - No pathogenic mutations detected  . Headache   . Heart murmur   . Hyperlipidemia   . Hypothyroidism   . Thyroid disease      Past Surgical History:  Procedure Laterality Date  . APPENDECTOMY     possible during hysterectomy  . BOWEL RESECTION N/A 11/11/2017   Procedure: SMALL BOWEL RESECTION;  Surgeon: Jules Husbands, MD;  Location: [facility] ORS;  Service: General;  Laterality: N/A;  . FLEXIBLE SIGMOIDOSCOPY N/A 11/07/2017   Procedure: Beryle Quant;  Surgeon: Jonathon Bellows, MD;  Location: [facility]  ENDOSCOPY;  Service: Gastroenterology;  Laterality: N/A;  . LAPAROSCOPIC SIGMOID COLECTOMY N/A 11/11/2017   Procedure: LAPAROSCOPIC SIGMOID COLECTOMY;  Surgeon: Jules Husbands, MD;  Location: [facility] ORS;  Service: General;  Laterality: N/A;  . MOUTH SURGERY    . PORTACATH PLACEMENT Right 11/29/2017   Procedure: INSERTION PORT-A-CATH;  Surgeon: Jules Husbands, MD;  Location: [place];  Service: General;  Laterality: Right;  May do local and IV sedation. Please have U/S  . TOTAL ABDOMINAL HYSTERECTOMY  05/20/2014   ooperectomy unilateral    Social History   Socioeconomic History  . Marital status: Married    Spouse name: Not on file  . Number of children: Not on file  . Years of education: Not on file  . Highest education level: Not on file  Social Needs  . Financial resource strain: Not on file  . Food insecurity - worry: Not on file  . Food insecurity - inability: Not on file  . Transportation needs - medical: Not on file  . Transportation needs - non-medical: Not on file  Occupational History  . Not on file  Tobacco Use  . Smoking status: Never Smoker  . Smokeless tobacco: Never Used  Substance and Sexual Activity  . Alcohol use: No    Alcohol/week: 0.0 oz  . Drug use: No  . Sexual activity: Yes  Other Topics Concern  . Not on file  Social History Narrative  . Not on file    Family History  Problem Relation Age of Onset  . Breast cancer Mother 84       currently 44  . Hypertension Mother   . Thyroid disease Brother   . Hernia Brother   . Thyroid cancer Maternal Grandmother 40       deceased 9  . Stomach cancer Paternal Grandfather        unconfirmed; deceased 40  . Stomach cancer Paternal Aunt 40       deceased 84  . Colon cancer Maternal Aunt 68       also lung cancer  . Stomach cancer Maternal Uncle 92     Current Outpatient Medications:  .  APPLE CIDER VINEGAR PO, Take 1 tablet by mouth daily., Disp: , Rfl:  .  Biotin 10 MG TABS, Take 2  tablets by mouth daily. , Disp: , Rfl:  .  cyanocobalamin (,VITAMIN B-12,) 1000 MCG/ML injection, , Disp: , Rfl:  .  dexamethasone (DECADRON) 4 MG tablet, Take 2 tablets (8 mg total) by mouth daily. Start the day after chemotherapy for 2 days. Take with food., Disp: 30 tablet, Rfl: 1 .  IRON, IRON,, Take 325 mg by mouth 2 (two) times daily. , Disp: , Rfl:  .  levothyroxine (SYNTHROID, LEVOTHROID) 175 MCG tablet, Take 1 tablet (175 mcg total) by mouth daily before breakfast., Disp: 30 tablet, Rfl: 11 .  lidocaine-prilocaine (EMLA) cream, Apply to affected area once, Disp: 30 g, Rfl: 3 .  LORazepam (ATIVAN) 0.5 MG tablet, Take 1 tablet (0.5 mg total) by mouth every 6 (six) hours as needed (Nausea or vomiting)., Disp: 30 tablet, Rfl: 0 .  Multiple Vitamin tablet, Take 2 tablets by mouth daily. , Disp: , Rfl:  .  OMEGA-3 FATTY ACIDS PO, Take 2 capsules by mouth daily. , Disp: , Rfl:  .  ondansetron (ZOFRAN) 8 MG tablet, Take 1 tablet (8 mg total) by mouth 2 (two) times daily as needed for refractory nausea / vomiting. Start on day 3 after chemotherapy., Disp: 30 tablet, Rfl: 1 .  prochlorperazine (COMPAZINE) 10 MG tablet, Take 1 tablet (10 mg total) by mouth every 6 (six) hours as needed (Nausea or vomiting)., Disp: 30 tablet, Rfl: 1 .  VITAMIN D, CHOLECALCIFEROL, PO, Take 1 tablet by mouth daily., Disp: , Rfl:  No current facility-administered medications for this visit.   Facility-Administered Medications Ordered in Other Visits:  .  heparin lock flush 100 unit/mL, 500 Units, Intravenous, Once, Randa Evens C, MD .  sodium chloride flush (NS) 0.9 % injection 10 mL, 10 mL, Intravenous, PRN, Sindy Guadeloupe, MD, 10 mL at 12/27/17 4503  Physical exam:  Vitals:   12/27/17 0855  BP: 126/85  Pulse: 76  Resp: 16  Temp: 98 F (36.7 C)  TempSrc: Tympanic   Physical Exam  Constitutional: She is oriented to person, place, and time and well-developed, well-nourished, and in no distress.  HENT:  Head:  Normocephalic and atraumatic.  Eyes: EOM are normal. Pupils are equal, round, and reactive to light.  Neck: Normal range of motion.  Cardiovascular: Normal rate, regular rhythm and normal heart sounds.  Pulmonary/Chest: Effort normal and breath sounds normal.  Abdominal: Soft. Bowel sounds are normal.  Midline surgical scar well-healed  Neurological: She is alert and oriented to person, place, and time.  Skin: Skin is warm and dry.     CMP Latest Ref Rng & Units 12/27/2017  Glucose 65 - 99 mg/dL 125(H)  BUN 6 - 20 mg/dL 7  Creatinine 0.44 - 1.00 mg/dL 0.67  Sodium 135 - 145 mmol/L 136  Potassium 3.5 - 5.1 mmol/L 3.5  Chloride 101 - 111 mmol/L 106  CO2 22 - 32 mmol/L 23  Calcium 8.9 - 10.3 mg/dL 8.6(L)  Total Protein 6.5 - 8.1 g/dL 6.7  Total Bilirubin 0.3 - 1.2 mg/dL 0.3  Alkaline Phos 38 - 126 U/L 121  AST 15 - 41 U/L 88(H)  ALT 14 - 54 U/L 162(H)   CBC Latest Ref Rng & Units 12/27/2017  WBC 3.6 - 11.0 K/uL 3.3(L)  Hemoglobin 12.0 - 16.0 g/dL 11.7(L)  Hematocrit 35.0 - 47.0 % 35.8  Platelets 150 - 440 K/uL 140(L)    No images are attached to the encounter.  Ct Chest W Contrast  Result Date: 12/07/2017 CLINICAL DATA:  New diagnosis of colon cancer with resection 11/11/2017. No thoracic symptoms. EXAM: CT CHEST WITH CONTRAST TECHNIQUE: Multidetector CT imaging of the chest was performed during intravenous contrast administration. CONTRAST:  31m ISOVUE-300 IOPAMIDOL (ISOVUE-300) INJECTION 61% COMPARISON:  Abdominal CT of 10/26/2017. Chest radiograph 11/19/2017. FINDINGS: Cardiovascular: A right Port-A-Cath which terminates at the superior caval/ atrial junction. Bovine arch. Normal heart size, without pericardial effusion. No central pulmonary embolism, on this non-dedicated study. Mediastinum/Nodes: No supraclavicular adenopathy. No mediastinal or definite hilar adenopathy, given limitations of unenhanced CT. Lungs/Pleura: No pleural fluid.  Clear lungs. Upper Abdomen: Normal imaged  portions of the liver, spleen, stomach, pancreas, gallbladder, adrenal glands, kidneys. Musculoskeletal: Borderline T8 vertebral body height loss. IMPRESSION: No acute process or evidence of metastatic disease in the chest. Electronically Signed   By: KAbigail MiyamotoM.D.   On: 12/07/2017 14:10   Dg Chest Port 1 View  Result Date: 11/29/2017 CLINICAL DATA:  Port placement EXAM: PORTABLE CHEST 1 VIEW COMPARISON:  06/05/2014 FINDINGS: Right Port-A-Cath has been placed with the tip in the SVC. No pneumothorax. Heart and mediastinal contours are within normal limits. No focal opacities or effusions. No acute bony abnormality. IMPRESSION: Right Port-A-Cath tip in the SVC.  No pneumothorax. Electronically Signed  By: Rolm Baptise M.D.   On: 11/29/2017 14:36   Dg Fluoro Rm 1-60 Min - No Report  Result Date: 11/29/2017 Fluoroscopy was utilized by the requesting physician.  No radiographic interpretation.     Assessment and plan- Patient is a 47 y.o. 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  Her white count is 3.3 and her ANC is 1.3 today.  I will therefore give her Neulasta on day 3 when she comes for her pump disconnect.  Subsequently I will give her Neulasta on an as-needed basis based on her white count.  Mild anemia and thrombocytopenia.  Continue to monitor  Abnormal LFTs: Patient was noted to have mildly elevated AST and ALT even back in August 2018.  Her AST and ALT are slowly trending up.  CT abdomen in November 2018 did not reveal any evidence of fatty liver or metastatic disease in the liver.  I will proceed with chemotherapy today.  However I will get an MRI of the abdomen with and without contrast to rule out any liver metastases or other liver pathology contributing to abnormal LFTs.  I  will also check hepatitis C antibody along with hepatitis B surface antigen surface antibody and core antibody.  Also check CK and GGT to a certain of the AST ALT is coming from the muscle of the liver.  Chemo induced nausea: Continue as needed medications  I will see her back in 2 weeks time with CBC and CMP   Visit Diagnosis 1. Elevated liver enzymes   2. Cancer of sigmoid ([place])   3. Encounter for antineoplastic chemotherapy   4. Chemotherapy induced neutropenia (H[facility])      Dr. Randa Evens, MD, MPH [facility] Pager- [phone] 12/27/2017 9:29 AM

## 2017-12-28 LAB — HEPATITIS C ANTIBODY: HCV Ab: <0.1 s/co ratio (ref 0.0–0.9)

## 2017-12-28 LAB — HEPATITIS B CORE ANTIBODY, TOTAL: HEP B C TOTAL AB: NEGATIVE

## 2017-12-28 LAB — HEPATITIS B SURFACE ANTIBODY, QUANTITATIVE

## 2017-12-29 ENCOUNTER — Inpatient Hospital Stay: Payer: Managed Care, Other (non HMO)

## 2017-12-29 VITALS — BP 117/78 | HR 71 | Temp 97.0°F | Resp 20

## 2017-12-29 DIAGNOSIS — C187 Malignant neoplasm of sigmoid colon: Secondary | ICD-10-CM

## 2017-12-29 DIAGNOSIS — Z5111 Encounter for antineoplastic chemotherapy: Secondary | ICD-10-CM | POA: Diagnosis not present

## 2017-12-29 MED ORDER — HEPARIN SOD (PORK) LOCK FLUSH 100 UNIT/ML IV SOLN
500.0000 [IU] | Freq: Once | INTRAVENOUS | Status: AC | PRN
  Administered 2017-12-29: 500 [IU]
  Filled 2017-12-29: qty 5

## 2017-12-29 MED ORDER — SODIUM CHLORIDE 0.9% FLUSH
10.0000 mL | INTRAVENOUS | Status: DC | PRN
  Administered 2017-12-29: 10 mL
  Filled 2017-12-29: qty 10

## 2017-12-29 MED ORDER — PEGFILGRASTIM INJECTION 6 MG/0.6ML ~~LOC~~
6.0000 mg | PREFILLED_SYRINGE | Freq: Once | SUBCUTANEOUS | Status: AC
  Administered 2017-12-29: 6 mg via SUBCUTANEOUS
  Filled 2017-12-29: qty 0.6

## 2018-01-04 ENCOUNTER — Telehealth: Payer: Self-pay | Admitting: *Deleted

## 2018-01-04 NOTE — Telephone Encounter (Signed)
Patient called Regina Patel today and asked for a call back.  I called patient and she was talking to me about she is having the Neulasta pains.  Is lasted over a week now.  She states that she uses Tylenol every 6 hours it helps just a little bit but by the time the 6 hours is up its back to hurting really bad again.  She went home early yesterday from work because her back was hurting her so much.  I told her that I would check with Dr. Janese Banks and get back with her.  Dr. Janese Banks states for her to try ibuprofen 2 pills at a time up to 3 times a day and try it for a couple days and if it does not work to give [place] a call back and she will have to give her something a little bit stronger when it is bothering her so much.  I left this information on her voicemail and asked her to call me back after trying the Advil or if she had questions

## 2018-01-05 ENCOUNTER — Ambulatory Visit
Admission: RE | Admit: 2018-01-05 | Discharge: 2018-01-05 | Disposition: A | Discharge status: Home / Self Care | Payer: Managed Care, Other (non HMO) | Source: Ambulatory Visit | Attending: Oncology | Admitting: Oncology

## 2018-01-05 DIAGNOSIS — R748 Abnormal levels of other serum enzymes: Secondary | ICD-10-CM

## 2018-01-05 DIAGNOSIS — C187 Malignant neoplasm of sigmoid colon: Secondary | ICD-10-CM

## 2018-01-05 DIAGNOSIS — K8020 Calculus of gallbladder without cholecystitis without obstruction: Secondary | ICD-10-CM | POA: Insufficient documentation

## 2018-01-05 MED ORDER — GADOXETATE DISODIUM 0.25 MMOL/ML IV SOLN
10.0000 mL | Freq: Once | INTRAVENOUS | Status: AC | PRN
  Administered 2018-01-05: 9 mL via INTRAVENOUS

## 2018-01-09 NOTE — Progress Notes (Signed)
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 Vicente Males from GI for recurrent episodes of diverticulitis sinceaugust2018 which was treated with antibiotics. However patient continued to have some abdominal cramping along with some bleeding in her stool and constipation alternating with diarrhea. Patient underwent flexible sigmoidoscopy on 11/07/2017. A circumferential infiltrative completely obstructing large mass was found in the rectosigmoid colon.Biopsy showed at least high-grade dysplasia. Patient was referred to Dr. Derinda Sis consideration of hemicolectomy.  Patient underwent laparoscopic low anterior resection along with small bowel resection and laparoscopic takedown of the splenic flexure on 11/11/2017. 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 Tumor size was 3 cm, moderately differentiated. All margins were negative for invasive carcinoma. Perineural invasion and lymphovascular invasion was present. 1 out of 17 lymph nodes was positive for malignancy. Pathologic stage was pT4b pN1a. MSI stable  Preoperative CEA is not available. CT abdomen on 10/26/2017 showed residual changes of diverticulitis although improved when compared to prior exam. No perforation or abscess is identified.  No family history of colon cancer. History of breast cancer in her mother in her 56s. Genetic testing was  negative   Interval history-patient reports that she had constipation for a few days after chemotherapy.  She took about 7-8 packets of MiraLAX over 3 days following which her constipation resolved but then she had loose stools for 2-3 days after that.  Her hair is thinning out because of chemotherapy.  Denies any abdominal pain or other complaints  ECOG PS- 0 Pain scale- 0   Review of systems- Review of Systems  Constitutional: Negative for chills, fever, malaise/fatigue and weight loss.  HENT: Negative for congestion, ear discharge and nosebleeds.   Eyes: Negative for blurred vision.  Respiratory: Negative for cough, hemoptysis, sputum production, shortness of breath and wheezing.   Cardiovascular: Negative for chest pain, palpitations, orthopnea and claudication.  Gastrointestinal: Negative for abdominal pain, blood in stool, constipation, diarrhea, heartburn, melena, nausea and vomiting.  Genitourinary: Negative for dysuria, flank pain, frequency, hematuria and urgency.  Musculoskeletal: Negative for back pain, joint pain and myalgias.  Skin: Negative for rash.  Neurological: Negative for dizziness, tingling, focal weakness, seizures, weakness and headaches.  Endo/Heme/Allergies: Does not bruise/bleed easily.  Psychiatric/Behavioral: Negative for depression and suicidal ideas. The patient does not have insomnia.        No Known Allergies   Past Medical History:  Diagnosis Date  . Anemia   . Cancer of sigmoid colon ([place]) 11/11/2017   Partial sigmoid colon resection.   . Diverticulosis   . Genetic testing 12/14/2017   Common Cancers panel (47 genes) @ [facility] - No pathogenic mutations detected  . Headache   . Heart murmur   . Hyperlipidemia   . Hypothyroidism   . Thyroid disease      Past Surgical History:  Procedure Laterality Date  . APPENDECTOMY     possible during hysterectomy  . BOWEL RESECTION N/A 11/11/2017   Procedure: SMALL BOWEL RESECTION;  Surgeon:  Jules Husbands, MD;  Location: [facility] ORS;  Service: General;  Laterality: N/A;  . FLEXIBLE SIGMOIDOSCOPY N/A 11/07/2017   Procedure: FLEXIBLE SIGMOIDOSCOPY;  Surgeon: Jonathon Bellows, MD;  Location: [facility] ENDOSCOPY;  Service: Gastroenterology;  Laterality: N/A;  . LAPAROSCOPIC SIGMOID COLECTOMY N/A 11/11/2017   Procedure: LAPAROSCOPIC SIGMOID COLECTOMY;  Surgeon: Jules Husbands, MD;  Location: [facility] ORS;  Service: General;  Laterality: N/A;  . MOUTH SURGERY    . PORTACATH PLACEMENT Right 11/29/2017   Procedure: INSERTION PORT-A-CATH;  Surgeon: Jules Husbands, MD;  Location: [place];  Service: General;  Laterality: Right;  May do local and IV sedation. Please have U/S  . TOTAL ABDOMINAL HYSTERECTOMY  05/20/2014   ooperectomy unilateral    Social History   Socioeconomic History  . Marital status: Married    Spouse name: Not on file  . Number of children: Not on file  . Years of education: Not on file  . Highest education level: Not on file  Social Needs  . Financial resource strain: Not on file  . Food insecurity - worry: Not on file  . Food insecurity - inability: Not on file  . Transportation needs - medical: Not on file  . Transportation needs - non-medical: Not on file  Occupational History  . Not on file  Tobacco Use  . Smoking status: Never Smoker  . Smokeless tobacco: Never Used  Substance and Sexual Activity  . Alcohol use: No    Alcohol/week: 0.0 oz  . Drug use: No  . Sexual activity: Yes  Other Topics Concern  . Not on file  Social History Narrative  . Not on file    Family History  Problem Relation Age of Onset  . Breast cancer Mother 28       currently 65  . Hypertension Mother   . Thyroid disease Brother   . Hernia Brother   . Thyroid cancer Maternal Grandmother 30       deceased 71  . Stomach cancer Paternal Grandfather        unconfirmed; deceased 70  . Stomach cancer Paternal Aunt 50       deceased 34  . Colon cancer Maternal Aunt 68        also lung cancer  . Stomach cancer Maternal Uncle 82     Current Outpatient Medications:  .  APPLE CIDER VINEGAR PO, Take 1 tablet by mouth daily., Disp: , Rfl:  .  Biotin 10 MG TABS, Take 2 tablets by mouth daily. , Disp: , Rfl:  .  cyanocobalamin (,VITAMIN B-12,) 1000 MCG/ML injection, , Disp: , Rfl:  .  dexamethasone (DECADRON) 4 MG tablet, Take 2 tablets (8 mg total) by mouth daily. Start the day after chemotherapy for 2 days. Take with food., Disp: 30 tablet, Rfl: 1 .  IRON, IRON,, Take 325 mg by mouth 2 (two) times daily. , Disp: , Rfl:  .  levothyroxine (SYNTHROID, LEVOTHROID) 175 MCG tablet, Take 1 tablet (175 mcg total) by mouth daily before breakfast., Disp: 30 tablet, Rfl: 11 .  lidocaine-prilocaine (EMLA) cream, Apply to affected area once, Disp: 30 g, Rfl: 3 .  LORazepam (ATIVAN) 0.5 MG tablet, Take 1 tablet (0.5 mg total) by mouth every 6 (six) hours as needed (Nausea or vomiting)., Disp: 30 tablet, Rfl: 0 .  Multiple Vitamin tablet, Take 2 tablets by mouth daily. , Disp: , Rfl:  .  OMEGA-3 FATTY ACIDS PO, Take 2 capsules by mouth daily. , Disp: , Rfl:  .  ondansetron (ZOFRAN) 8  MG tablet, Take 1 tablet (8 mg total) by mouth 2 (two) times daily as needed for refractory nausea / vomiting. Start on day 3 after chemotherapy., Disp: 30 tablet, Rfl: 1 .  prochlorperazine (COMPAZINE) 10 MG tablet, Take 1 tablet (10 mg total) by mouth every 6 (six) hours as needed (Nausea or vomiting)., Disp: 30 tablet, Rfl: 1 .  VITAMIN D, CHOLECALCIFEROL, PO, Take 1 tablet by mouth daily., Disp: , Rfl:   Physical exam:  Vitals:   01/10/18 0901  BP: (!) 153/100  Temp: 97.9 F (36.6 C)  TempSrc: Tympanic  Weight: 201 lb (91.2 kg)   Physical Exam  Constitutional: She is oriented to person, place, and time and well-developed, well-nourished, and in no distress.  HENT:  Head: Normocephalic and atraumatic.  Eyes: EOM are normal. Pupils are equal, round, and reactive to light.  Neck: Normal range  of motion.  Cardiovascular: Normal rate, regular rhythm and normal heart sounds.  Pulmonary/Chest: Effort normal and breath sounds normal.  Abdominal: Soft. Bowel sounds are normal.  Neurological: She is alert and oriented to person, place, and time.  Skin: Skin is warm and dry.     CMP Latest Ref Rng & Units 01/10/2018  Glucose 65 - 99 mg/dL 111(H)  BUN 6 - 20 mg/dL 8  Creatinine 0.44 - 1.00 mg/dL 0.69  Sodium 135 - 145 mmol/L 137  Potassium 3.5 - 5.1 mmol/L 3.7  Chloride 101 - 111 mmol/L 102  CO2 22 - 32 mmol/L 27  Calcium 8.9 - 10.3 mg/dL 8.9  Total Protein 6.5 - 8.1 g/dL 6.5  Total Bilirubin 0.3 - 1.2 mg/dL 0.3  Alkaline Phos 38 - 126 U/L 132(H)  AST 15 - 41 U/L 85(H)  ALT 14 - 54 U/L 242(H)   CBC Latest Ref Rng & Units 01/10/2018  WBC 3.6 - 11.0 K/uL 9.1  Hemoglobin 12.0 - 16.0 g/dL 12.4  Hematocrit 35.0 - 47.0 % 37.5  Platelets 150 - 440 K/uL 86(L)    No images are attached to the encounter.  Mr Abdomen W Wo Contrast  Result Date: 01/05/2018 CLINICAL DATA:  Elevated liver function tests. Colon carcinoma. Undergoing chemotherapy. EXAM: MRI ABDOMEN WITHOUT AND WITH CONTRAST TECHNIQUE: Multiplanar multisequence MR imaging of the abdomen was performed both before and after the administration of intravenous contrast. CONTRAST:  22m EOVIST GADOXETATE DISODIUM 0.25 MOL/L IV SOLN COMPARISON:  None. FINDINGS: Lower chest: No acute findings. Hepatobiliary: No hepatic masses identified. The liver shows diffuse T2 hypointense signal, and T1 hypointense signal on inphase gradient echo images, consistent with iron deposition. Sparing of the spleen, pancreas, and bone marrow noted, raising suspicion for primary hemochromatosis. No gross morphologic changes of cirrhosis identified. Numerous small gallstones are seen, however there is no evidence of cholecystitis or biliary ductal dilatation. Pancreas: Normal signal intensity. No mass or inflammatory changes. Spleen: Within normal limits in size  and appearance. Normal signal intensity. Adrenals/Urinary Tract: No masses identified. No evidence of hydronephrosis. Stomach/Bowel: Visualized abdominal bowel unremarkable. Vascular/Lymphatic: No pathologically enlarged lymph nodes identified. No abdominal aortic aneurysm. Other:  None. Musculoskeletal:  No suspicious bone lesions identified. IMPRESSION: No evidence of hepatic or other abdominal metastatic disease. Cholelithiasis. No radiographic evidence of cholecystitis or biliary dilatation. Diffuse hepatic iron deposition, suspicious for primary hemochromatosis. Electronically Signed   By: JEarle GellM.D.   On: 01/05/2018 12:41     Assessment and plan- Patient is a 47y.o. 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  This is likely secondary to oxaliplatin.  I will therefore hold her chemotherapy today.  Abnormal LFTs: Likely secondary to oxaliplatin.  Hold chemotherapy today and monitor LFTs next week.  If LFTs are coming down it would be okay to proceed with FOLFOX chemotherapy next week.  Hep B and hep C testing was negative.  GGT was elevated indicating it is coming from the liver.  MRI of the abdomen did not reveal any evidence of metastatic disease.  Diffuse iron causation was suspicious for primary hemochromatosis.  I suspect that these findings are secondary to IV iron that she has been her low ferritin and TIBC argue against hemochromatosis  Iron deficiency anemia: H&H is improved after 2 doses of Feraheme.  I will repeat ferritin and iron studies in 3 weeks time  She will be seen by covering provider (MD) in 1 week with CBC, CMP for reconsideration of cycle 3 of FOLFOX chemotherapy.  If her [place] is less than 1.5 she will need  Neulasta with treatment.  Okay to give chemotherapy next week if platelet counts are more than 75 and LFTs are trending down     Visit Diagnosis 1. Cancer of sigmoid ([place])   2. Encounter for antineoplastic chemotherapy   3. Abnormal LFTs      Dr. Randa Evens, MD, MPH [facility] at [facility] Pager- [phone] 01/10/2018 11:59 AM

## 2018-01-10 ENCOUNTER — Inpatient Hospital Stay: Payer: Managed Care, Other (non HMO)

## 2018-01-10 ENCOUNTER — Inpatient Hospital Stay (HOSPITAL_BASED_OUTPATIENT_CLINIC_OR_DEPARTMENT_OTHER): Payer: Managed Care, Other (non HMO) | Admitting: Oncology

## 2018-01-10 ENCOUNTER — Encounter: Payer: Self-pay | Admitting: Oncology

## 2018-01-10 ENCOUNTER — Other Ambulatory Visit: Payer: Self-pay | Admitting: *Deleted

## 2018-01-10 VITALS — BP 153/100 | Temp 97.9°F | Wt 201.0 lb

## 2018-01-10 DIAGNOSIS — E039 Hypothyroidism, unspecified: Secondary | ICD-10-CM | POA: Diagnosis not present

## 2018-01-10 DIAGNOSIS — R945 Abnormal results of liver function studies: Secondary | ICD-10-CM

## 2018-01-10 DIAGNOSIS — C187 Malignant neoplasm of sigmoid colon: Secondary | ICD-10-CM

## 2018-01-10 DIAGNOSIS — C779 Secondary and unspecified malignant neoplasm of lymph node, unspecified: Secondary | ICD-10-CM

## 2018-01-10 DIAGNOSIS — Z5111 Encounter for antineoplastic chemotherapy: Secondary | ICD-10-CM

## 2018-01-10 DIAGNOSIS — D509 Iron deficiency anemia, unspecified: Secondary | ICD-10-CM | POA: Diagnosis not present

## 2018-01-10 DIAGNOSIS — R7989 Other specified abnormal findings of blood chemistry: Secondary | ICD-10-CM

## 2018-01-10 LAB — COMPREHENSIVE METABOLIC PANEL
ALBUMIN: 3.5 g/dL (ref 3.5–5.0)
ALK PHOS: 132 U/L — AB (ref 38–126)
ALT: 242 U/L — ABNORMAL HIGH (ref 14–54)
ANION GAP: 8 (ref 5–15)
AST: 85 U/L — AB (ref 15–41)
BUN: 8 mg/dL (ref 6–20)
CALCIUM: 8.9 mg/dL (ref 8.9–10.3)
CO2: 27 mmol/L (ref 22–32)
Chloride: 102 mmol/L (ref 101–111)
Creatinine, Ser: 0.69 mg/dL (ref 0.44–1.00)
GFR calc Af Amer: >60 mL/min (ref >60)
GFR calc non Af Amer: >60 mL/min (ref >60)
GLUCOSE: 111 mg/dL — AB (ref 65–99)
POTASSIUM: 3.7 mmol/L (ref 3.5–5.1)
SODIUM: 137 mmol/L (ref 135–145)
Total Bilirubin: 0.3 mg/dL (ref 0.3–1.2)
Total Protein: 6.5 g/dL (ref 6.5–8.1)

## 2018-01-10 LAB — CBC WITH DIFFERENTIAL/PLATELET
Basophils Absolute: 0.1 10*3/uL (ref 0–0.1)
Basophils Relative: 1 %
EOS PCT: 3 %
Eosinophils Absolute: 0.2 10*3/uL (ref 0–0.7)
HEMATOCRIT: 37.5 % (ref 35.0–47.0)
Hemoglobin: 12.4 g/dL (ref 12.0–16.0)
LYMPHS PCT: 24 %
Lymphs Abs: 2.2 10*3/uL (ref 1.0–3.6)
MCH: 28.1 pg (ref 26.0–34.0)
MCHC: 33.1 g/dL (ref 32.0–36.0)
MCV: 85 fL (ref 80.0–100.0)
MONO ABS: 0.4 10*3/uL (ref 0.2–0.9)
MONOS PCT: 5 %
NEUTROS ABS: 6.2 10*3/uL (ref 1.4–6.5)
Neutrophils Relative %: 67 %
Platelets: 86 10*3/uL — ABNORMAL LOW (ref 150–440)
RBC: 4.42 MIL/uL (ref 3.80–5.20)
RDW: 20.4 % — AB (ref 11.5–14.5)
WBC: 9.1 10*3/uL (ref 3.6–11.0)

## 2018-01-10 MED ORDER — SODIUM CHLORIDE 0.9% FLUSH
10.0000 mL | Freq: Once | INTRAVENOUS | Status: AC
  Administered 2018-01-10: 10 mL via INTRAVENOUS
  Filled 2018-01-10: qty 10

## 2018-01-10 MED ORDER — HEPARIN SOD (PORK) LOCK FLUSH 100 UNIT/ML IV SOLN
500.0000 [IU] | Freq: Once | INTRAVENOUS | Status: AC
  Administered 2018-01-10: 500 [IU] via INTRAVENOUS
  Filled 2018-01-10: qty 5

## 2018-01-10 MED ORDER — LORAZEPAM 0.5 MG PO TABS: 0.5000 mg | ORAL_TABLET | Freq: Four times a day (QID) | ORAL | 0 refills | Status: DC | PRN

## 2018-01-12 ENCOUNTER — Inpatient Hospital Stay: Payer: Managed Care, Other (non HMO)

## 2018-01-16 NOTE — Progress Notes (Signed)
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 Vicente Males from GI for recurrent episodes of diverticulitis sinceaugust2018 which was treated with antibiotics. However patient continued to have some abdominal cramping along with some bleeding in her stool and constipation alternating with diarrhea. Patient underwent flexible sigmoidoscopy on 11/07/2017. A circumferential infiltrative completely obstructing large mass was found in the rectosigmoid colon.Biopsy showed at least high-grade dysplasia. Patient was referred to Dr. Derinda Sis consideration of hemicolectomy.  Patient underwent laparoscopic low anterior resection along with small bowel resection and laparoscopic takedown of the splenic flexure on 11/11/2017. 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 Tumor size was 3 cm, moderately differentiated. All margins were negative for invasive carcinoma. Perineural invasion and lymphovascular invasion was present. 1 out of 17 lymph nodes was positive for malignancy. Pathologic stage was pT4b pN1a. MSI stable  Preoperative CEA is not available. CT abdomen on 10/26/2017 showed residual changes of diverticulitis although improved when compared to prior exam. No perforation or abscess is identified.  No family history of colon cancer. History of breast cancer in her mother in her 50s. Genetic testing was  negative    Interval history-patient is doing well and denies any nausea vomiting or abdominal pain today.  Her bowel movements have now regularized.  She denies any tingling numbness in her hands and feet  ECOG PS- 0 Pain scale- 0   Review of systems- Review of Systems  Constitutional: Negative for chills, fever, malaise/fatigue and weight loss.  HENT: Negative for congestion, ear discharge and nosebleeds.   Eyes: Negative for blurred vision.  Respiratory: Negative for cough, hemoptysis, sputum production, shortness of breath and wheezing.   Cardiovascular: Negative for chest pain, palpitations, orthopnea and claudication.  Gastrointestinal: Negative for abdominal pain, blood in stool, constipation, diarrhea, heartburn, melena, nausea and vomiting.  Genitourinary: Negative for dysuria, flank pain, frequency, hematuria and urgency.  Musculoskeletal: Negative for back pain, joint pain and myalgias.  Skin: Negative for rash.  Neurological: Negative for dizziness, tingling, focal weakness, seizures, weakness and headaches.  Endo/Heme/Allergies: Does not bruise/bleed easily.  Psychiatric/Behavioral: Negative for depression and suicidal ideas. The patient does not have insomnia.       No Known Allergies   Past Medical History:  Diagnosis Date  . Anemia   . Cancer of sigmoid colon ([place]) 11/11/2017   Partial sigmoid colon resection.   . Diverticulosis   . Genetic testing 12/14/2017   Common Cancers panel (47 genes) @ [facility] - No pathogenic mutations detected  . Headache   . Heart murmur   . Hyperlipidemia   . Hypothyroidism   . Thyroid disease      Past Surgical History:  Procedure Laterality Date  . APPENDECTOMY     possible during hysterectomy  . BOWEL RESECTION N/A 11/11/2017   Procedure: SMALL BOWEL RESECTION;  Surgeon: Jules Husbands, MD;  Location: [facility] ORS;  Service: General;  Laterality: N/A;  . FLEXIBLE SIGMOIDOSCOPY N/A 11/07/2017   Procedure: Beryle Quant;  Surgeon: Jonathon Bellows, MD;  Location: [facility] ENDOSCOPY;  Service: Gastroenterology;  Laterality: N/A;  . LAPAROSCOPIC SIGMOID COLECTOMY N/A 11/11/2017   Procedure: LAPAROSCOPIC SIGMOID COLECTOMY;  Surgeon: Jules Husbands, MD;  Location: [facility] ORS;  Service: General;  Laterality: N/A;  . MOUTH SURGERY    . PORTACATH PLACEMENT Right 11/29/2017   Procedure: INSERTION PORT-A-CATH;  Surgeon: Jules Husbands, MD;  Location: [place];  Service: General;  Laterality: Right;  May do local and IV sedation. Please have U/S  . TOTAL ABDOMINAL HYSTERECTOMY  05/20/2014   ooperectomy unilateral    Social History   Socioeconomic History  . Marital status: Married    Spouse name: Not on file  . Number of children: Not on file  . Years of education: Not on file  . Highest education level: Not on file  Social Needs  . Financial resource strain: Not on file  . Food insecurity - worry: Not on file  . Food insecurity - inability: Not on file  . Transportation needs - medical: Not on file  . Transportation needs - non-medical: Not on file  Occupational History  . Not on file  Tobacco Use  . Smoking status: Never Smoker  . Smokeless tobacco: Never Used  Substance and Sexual Activity  . Alcohol use: No    Alcohol/week: 0.0 oz  . Drug use: No  . Sexual activity: Yes  Other Topics Concern  . Not on file  Social History Narrative  . Not on file    Family History  Problem Relation Age of Onset  . Breast cancer Mother 43       currently 9  . Hypertension Mother   . Thyroid disease Brother   . Hernia Brother   . Thyroid cancer Maternal Grandmother 40       deceased 30  . Stomach cancer Paternal Grandfather        unconfirmed; deceased 70  . Stomach cancer Paternal Aunt 51       deceased 62  . Colon cancer Maternal Aunt 68       also lung cancer  . Stomach cancer Maternal Uncle 59     Current Outpatient Medications:  .  APPLE CIDER VINEGAR PO, Take 1 tablet by mouth  daily., Disp: , Rfl:  .  Biotin 10 MG TABS, Take 2 tablets by mouth daily. , Disp: , Rfl:  .  cyanocobalamin (,VITAMIN B-12,) 1000 MCG/ML injection, , Disp: , Rfl:  .  dexamethasone (DECADRON) 4 MG tablet, Take 2 tablets (8 mg total) by mouth daily. Start the day after chemotherapy for 2 days. Take with food., Disp: 30 tablet, Rfl: 1 .  levothyroxine (SYNTHROID, LEVOTHROID) 175 MCG tablet, Take 1 tablet (175 mcg total) by mouth daily before breakfast., Disp: 30 tablet, Rfl: 11 .  lidocaine-prilocaine (EMLA) cream, Apply to affected area once, Disp: 30 g, Rfl: 3 .  LORazepam (ATIVAN) 0.5 MG tablet, Take 1 tablet (0.5 mg total) by mouth every 6 (six) hours as needed (Nausea or vomiting)., Disp: 30 tablet, Rfl: 0 .  Multiple Vitamin tablet, Take 2 tablets by mouth daily. , Disp: , Rfl:  .  OMEGA-3 FATTY ACIDS PO, Take 2 capsules by mouth daily. , Disp: , Rfl:  .  ondansetron (ZOFRAN) 8 MG tablet, Take 1 tablet (8 mg total) by mouth 2 (two) times daily as needed for refractory nausea / vomiting. Start on day 3 after chemotherapy., Disp: 30 tablet, Rfl: 1 .  prochlorperazine (COMPAZINE) 10 MG tablet, Take 1 tablet (10 mg  total) by mouth every 6 (six) hours as needed (Nausea or vomiting)., Disp: 30 tablet, Rfl: 1 .  VITAMIN D, CHOLECALCIFEROL, PO, Take 1 tablet by mouth daily., Disp: , Rfl:   Physical exam:  Vitals:   01/17/18 0917  BP: (!) 146/92  Pulse: (!) 105  Resp: 16  Temp: (!) 96.8 F (36 C)  TempSrc: Tympanic  Weight: 204 lb (92.5 kg)   Physical Exam  Constitutional: She is oriented to person, place, and time and well-developed, well-nourished, and in no distress.  HENT:  Head: Normocephalic and atraumatic.  Eyes: EOM are normal. Pupils are equal, round, and reactive to light.  Neck: Normal range of motion.  Cardiovascular: Normal rate, regular rhythm and normal heart sounds.  Pulmonary/Chest: Effort normal and breath sounds normal.  Abdominal: Soft. Bowel sounds are normal.    Midline surgical scar well-healed.  Neurological: She is alert and oriented to person, place, and time.  Skin: Skin is warm and dry.     CMP Latest Ref Rng & Units 01/17/2018  Glucose 65 - 99 mg/dL 114(H)  BUN 6 - 20 mg/dL 9  Creatinine 0.44 - 1.00 mg/dL 0.61  Sodium 135 - 145 mmol/L 137  Potassium 3.5 - 5.1 mmol/L 3.8  Chloride 101 - 111 mmol/L 104  CO2 22 - 32 mmol/L 23  Calcium 8.9 - 10.3 mg/dL 9.0  Total Protein 6.5 - 8.1 g/dL 6.8  Total Bilirubin 0.3 - 1.2 mg/dL 0.3  Alkaline Phos 38 - 126 U/L 117  AST 15 - 41 U/L 63(H)  ALT 14 - 54 U/L 135(H)   CBC Latest Ref Rng & Units 01/17/2018  WBC 3.6 - 11.0 K/uL 4.8  Hemoglobin 12.0 - 16.0 g/dL 12.6  Hematocrit 35.0 - 47.0 % 37.8  Platelets 150 - 440 K/uL 181    No images are attached to the encounter.  Mr Abdomen W Wo Contrast  Result Date: 01/05/2018 CLINICAL DATA:  Elevated liver function tests. Colon carcinoma. Undergoing chemotherapy. EXAM: MRI ABDOMEN WITHOUT AND WITH CONTRAST TECHNIQUE: Multiplanar multisequence MR imaging of the abdomen was performed both before and after the administration of intravenous contrast. CONTRAST:  61m EOVIST GADOXETATE DISODIUM 0.25 MOL/L IV SOLN COMPARISON:  None. FINDINGS: Lower chest: No acute findings. Hepatobiliary: No hepatic masses identified. The liver shows diffuse T2 hypointense signal, and T1 hypointense signal on inphase gradient echo images, consistent with iron deposition. Sparing of the spleen, pancreas, and bone marrow noted, raising suspicion for primary hemochromatosis. No gross morphologic changes of cirrhosis identified. Numerous small gallstones are seen, however there is no evidence of cholecystitis or biliary ductal dilatation. Pancreas: Normal signal intensity. No mass or inflammatory changes. Spleen: Within normal limits in size and appearance. Normal signal intensity. Adrenals/Urinary Tract: No masses identified. No evidence of hydronephrosis. Stomach/Bowel: Visualized  abdominal bowel unremarkable. Vascular/Lymphatic: No pathologically enlarged lymph nodes identified. No abdominal aortic aneurysm. Other:  None. Musculoskeletal:  No suspicious bone lesions identified. IMPRESSION: No evidence of hepatic or other abdominal metastatic disease. Cholelithiasis. No radiographic evidence of cholecystitis or biliary dilatation. Diffuse hepatic iron deposition, suspicious for primary hemochromatosis. Electronically Signed   By: JEarle GellM.D.   On: 01/05/2018 12:41     Assessment and plan- Patient is a 47y.o. 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  Chemo induced thrombocytopenia has now resolved.  Her LFTs have also improved.  AST and ALT are trending down and alkaline phosphatase has normalized.  She will therefore proceed with cycle #3 of FOLFOX chemotherapy today.  She did have chemo-induced neutropenia with cycle #1 and she is getting Neulasta on an as-needed basis.  She will be getting Neulasta on day 3 for this cycle as well.  I will dose reduce her oxaliplatin to 65 mg/m given her prior thrombocytopenia and elevated LFTs.  2.  She will continue bowel meds for her constipation but back off if she develops any significant diarrhea  3.  Iron deficiency anemia: Status post 2 doses of Feraheme.  Check iron studies next time  I will see her back in 2 weeks time with CBC and CMP for cycle #4 of FOLFOX chemotherapy.     Visit Diagnosis 1. Cancer of sigmoid ([place])   2. Abnormal LFTs   3. Chemotherapy-induced thrombocytopenia   4. Encounter for antineoplastic chemotherapy      Dr. Randa Evens, MD, MPH [facility] at [facility] Pager- [phone] 01/16/2018 4:40 PM

## 2018-01-17 ENCOUNTER — Inpatient Hospital Stay: Payer: Managed Care, Other (non HMO)

## 2018-01-17 ENCOUNTER — Encounter: Payer: Self-pay | Admitting: Oncology

## 2018-01-17 ENCOUNTER — Inpatient Hospital Stay (HOSPITAL_BASED_OUTPATIENT_CLINIC_OR_DEPARTMENT_OTHER): Payer: Managed Care, Other (non HMO) | Admitting: Oncology

## 2018-01-17 VITALS — BP 146/92 | HR 105 | Temp 96.8°F | Resp 16 | Wt 204.0 lb

## 2018-01-17 DIAGNOSIS — D6959 Other secondary thrombocytopenia: Secondary | ICD-10-CM

## 2018-01-17 DIAGNOSIS — C779 Secondary and unspecified malignant neoplasm of lymph node, unspecified: Secondary | ICD-10-CM | POA: Diagnosis not present

## 2018-01-17 DIAGNOSIS — C187 Malignant neoplasm of sigmoid colon: Secondary | ICD-10-CM | POA: Diagnosis not present

## 2018-01-17 DIAGNOSIS — R945 Abnormal results of liver function studies: Secondary | ICD-10-CM

## 2018-01-17 DIAGNOSIS — T451X5A Adverse effect of antineoplastic and immunosuppressive drugs, initial encounter: Secondary | ICD-10-CM

## 2018-01-17 DIAGNOSIS — R7989 Other specified abnormal findings of blood chemistry: Secondary | ICD-10-CM

## 2018-01-17 DIAGNOSIS — Z5111 Encounter for antineoplastic chemotherapy: Secondary | ICD-10-CM | POA: Diagnosis not present

## 2018-01-17 LAB — COMPREHENSIVE METABOLIC PANEL
ALT: 135 U/L — AB (ref 14–54)
ANION GAP: 10 (ref 5–15)
AST: 63 U/L — ABNORMAL HIGH (ref 15–41)
Albumin: 3.6 g/dL (ref 3.5–5.0)
Alkaline Phosphatase: 117 U/L (ref 38–126)
BUN: 9 mg/dL (ref 6–20)
CALCIUM: 9 mg/dL (ref 8.9–10.3)
CHLORIDE: 104 mmol/L (ref 101–111)
CO2: 23 mmol/L (ref 22–32)
CREATININE: 0.61 mg/dL (ref 0.44–1.00)
Glucose, Bld: 114 mg/dL — ABNORMAL HIGH (ref 65–99)
Potassium: 3.8 mmol/L (ref 3.5–5.1)
Sodium: 137 mmol/L (ref 135–145)
Total Bilirubin: 0.3 mg/dL (ref 0.3–1.2)
Total Protein: 6.8 g/dL (ref 6.5–8.1)

## 2018-01-17 LAB — CBC WITH DIFFERENTIAL/PLATELET
Basophils Absolute: 0 10*3/uL (ref 0–0.1)
Basophils Relative: 1 %
EOS ABS: 0.1 10*3/uL (ref 0–0.7)
Eosinophils Relative: 2 %
HEMATOCRIT: 37.8 % (ref 35.0–47.0)
HEMOGLOBIN: 12.6 g/dL (ref 12.0–16.0)
LYMPHS ABS: 1.9 10*3/uL (ref 1.0–3.6)
Lymphocytes Relative: 40 %
MCH: 28.6 pg (ref 26.0–34.0)
MCHC: 33.3 g/dL (ref 32.0–36.0)
MCV: 86 fL (ref 80.0–100.0)
MONOS PCT: 9 %
Monocytes Absolute: 0.4 10*3/uL (ref 0.2–0.9)
NEUTROS ABS: 2.3 10*3/uL (ref 1.4–6.5)
NEUTROS PCT: 48 %
Platelets: 181 10*3/uL (ref 150–440)
RBC: 4.39 MIL/uL (ref 3.80–5.20)
RDW: 21.4 % — ABNORMAL HIGH (ref 11.5–14.5)
WBC: 4.8 10*3/uL (ref 3.6–11.0)

## 2018-01-17 MED ORDER — DEXTROSE 5 % IV SOLN
Freq: Once | INTRAVENOUS | Status: AC
  Administered 2018-01-17: 10:00:00 via INTRAVENOUS
  Filled 2018-01-17: qty 1000

## 2018-01-17 MED ORDER — LEUCOVORIN CALCIUM INJECTION 350 MG
850.0000 mg | Freq: Once | INTRAVENOUS | Status: AC
  Administered 2018-01-17: 850 mg via INTRAVENOUS
  Filled 2018-01-17: qty 35

## 2018-01-17 MED ORDER — FLUOROURACIL CHEMO INJECTION 2.5 GM/50ML
400.0000 mg/m2 | Freq: Once | INTRAVENOUS | Status: AC
  Administered 2018-01-17: 850 mg via INTRAVENOUS
  Filled 2018-01-17: qty 17

## 2018-01-17 MED ORDER — OXALIPLATIN CHEMO INJECTION 100 MG/20ML
65.0000 mg/m2 | Freq: Once | INTRAVENOUS | Status: AC
  Administered 2018-01-17: 135 mg via INTRAVENOUS
  Filled 2018-01-17: qty 20

## 2018-01-17 MED ORDER — SODIUM CHLORIDE 0.9 % IV SOLN: 10.0000 mg | Freq: Once | INTRAVENOUS | Status: DC

## 2018-01-17 MED ORDER — SODIUM CHLORIDE 0.9 % IV SOLN
2400.0000 mg/m2 | INTRAVENOUS | Status: DC
  Administered 2018-01-17: 5000 mg via INTRAVENOUS
  Filled 2018-01-17: qty 100

## 2018-01-17 MED ORDER — DEXAMETHASONE SODIUM PHOSPHATE 10 MG/ML IJ SOLN
10.0000 mg | Freq: Once | INTRAMUSCULAR | Status: AC
  Administered 2018-01-17: 10 mg via INTRAVENOUS
  Filled 2018-01-17: qty 1

## 2018-01-17 MED ORDER — SODIUM CHLORIDE 0.9% FLUSH
10.0000 mL | INTRAVENOUS | Status: DC | PRN
  Administered 2018-01-17: 10 mL via INTRAVENOUS
  Filled 2018-01-17: qty 10

## 2018-01-17 MED ORDER — PALONOSETRON HCL INJECTION 0.25 MG/5ML
0.2500 mg | Freq: Once | INTRAVENOUS | Status: AC
  Administered 2018-01-17: 0.25 mg via INTRAVENOUS
  Filled 2018-01-17: qty 5

## 2018-01-17 NOTE — Progress Notes (Signed)
Labs outside of parameters, Ok per Dr Janese Banks to proceed with treatment today.

## 2018-01-19 ENCOUNTER — Inpatient Hospital Stay: Payer: Managed Care, Other (non HMO)

## 2018-01-19 DIAGNOSIS — Z5111 Encounter for antineoplastic chemotherapy: Secondary | ICD-10-CM | POA: Diagnosis not present

## 2018-01-19 DIAGNOSIS — C187 Malignant neoplasm of sigmoid colon: Secondary | ICD-10-CM

## 2018-01-19 MED ORDER — SODIUM CHLORIDE 0.9% FLUSH
10.0000 mL | INTRAVENOUS | Status: DC | PRN
  Administered 2018-01-19: 10 mL via INTRAVENOUS
  Filled 2018-01-19: qty 10

## 2018-01-19 MED ORDER — PEGFILGRASTIM INJECTION 6 MG/0.6ML ~~LOC~~
6.0000 mg | PREFILLED_SYRINGE | Freq: Once | SUBCUTANEOUS | Status: AC
  Administered 2018-01-19: 6 mg via SUBCUTANEOUS
  Filled 2018-01-19: qty 0.6

## 2018-01-19 MED ORDER — HEPARIN SOD (PORK) LOCK FLUSH 100 UNIT/ML IV SOLN
500.0000 [IU] | Freq: Once | INTRAVENOUS | Status: AC
  Administered 2018-01-19: 500 [IU] via INTRAVENOUS
  Filled 2018-01-19: qty 5

## 2018-01-26 ENCOUNTER — Encounter: Payer: Self-pay | Admitting: Oncology

## 2018-01-31 ENCOUNTER — Inpatient Hospital Stay: Payer: Managed Care, Other (non HMO)

## 2018-01-31 ENCOUNTER — Inpatient Hospital Stay: Payer: Managed Care, Other (non HMO) | Attending: Oncology | Admitting: Oncology

## 2018-01-31 ENCOUNTER — Other Ambulatory Visit: Payer: Self-pay

## 2018-01-31 ENCOUNTER — Encounter: Payer: Self-pay | Admitting: Oncology

## 2018-01-31 VITALS — BP 168/100 | HR 87 | Temp 97.3°F | Resp 18 | Wt 208.6 lb

## 2018-01-31 VITALS — BP 140/85 | HR 76

## 2018-01-31 DIAGNOSIS — C187 Malignant neoplasm of sigmoid colon: Secondary | ICD-10-CM | POA: Diagnosis not present

## 2018-01-31 DIAGNOSIS — G629 Polyneuropathy, unspecified: Secondary | ICD-10-CM | POA: Diagnosis not present

## 2018-01-31 DIAGNOSIS — G620 Drug-induced polyneuropathy: Secondary | ICD-10-CM

## 2018-01-31 DIAGNOSIS — Z5111 Encounter for antineoplastic chemotherapy: Secondary | ICD-10-CM

## 2018-01-31 DIAGNOSIS — D6959 Other secondary thrombocytopenia: Secondary | ICD-10-CM | POA: Diagnosis not present

## 2018-01-31 DIAGNOSIS — R7989 Other specified abnormal findings of blood chemistry: Secondary | ICD-10-CM | POA: Diagnosis not present

## 2018-01-31 DIAGNOSIS — T451X5A Adverse effect of antineoplastic and immunosuppressive drugs, initial encounter: Secondary | ICD-10-CM

## 2018-01-31 LAB — CBC WITH DIFFERENTIAL/PLATELET
BASOS ABS: 0 10*3/uL (ref 0–0.1)
Basophils Relative: 0 %
Eosinophils Absolute: 0.4 10*3/uL (ref 0–0.7)
Eosinophils Relative: 6 %
HEMATOCRIT: 37.2 % (ref 35.0–47.0)
HEMOGLOBIN: 12.4 g/dL (ref 12.0–16.0)
Lymphocytes Relative: 29 %
Lymphs Abs: 2.1 10*3/uL (ref 1.0–3.6)
MCH: 29 pg (ref 26.0–34.0)
MCHC: 33.3 g/dL (ref 32.0–36.0)
MCV: 87.3 fL (ref 80.0–100.0)
MONOS PCT: 8 %
Monocytes Absolute: 0.6 10*3/uL (ref 0.2–0.9)
NEUTROS ABS: 4.1 10*3/uL (ref 1.4–6.5)
NEUTROS PCT: 57 %
Platelets: 119 10*3/uL — ABNORMAL LOW (ref 150–440)
RBC: 4.26 MIL/uL (ref 3.80–5.20)
RDW: 23.1 % — ABNORMAL HIGH (ref 11.5–14.5)
WBC: 7.2 10*3/uL (ref 3.6–11.0)

## 2018-01-31 LAB — COMPREHENSIVE METABOLIC PANEL
ALK PHOS: 116 U/L (ref 38–126)
ALT: 81 U/L — ABNORMAL HIGH (ref 14–54)
ANION GAP: 7 (ref 5–15)
AST: 47 U/L — ABNORMAL HIGH (ref 15–41)
Albumin: 3.5 g/dL (ref 3.5–5.0)
BILIRUBIN TOTAL: 0.4 mg/dL (ref 0.3–1.2)
BUN: 7 mg/dL (ref 6–20)
CALCIUM: 8.9 mg/dL (ref 8.9–10.3)
CO2: 25 mmol/L (ref 22–32)
Chloride: 105 mmol/L (ref 101–111)
Creatinine, Ser: 0.61 mg/dL (ref 0.44–1.00)
Glucose, Bld: 116 mg/dL — ABNORMAL HIGH (ref 65–99)
Potassium: 3.7 mmol/L (ref 3.5–5.1)
Sodium: 137 mmol/L (ref 135–145)
TOTAL PROTEIN: 6.6 g/dL (ref 6.5–8.1)

## 2018-01-31 LAB — IRON AND TIBC
Iron: 83 ug/dL (ref 28–170)
SATURATION RATIOS: 24 % (ref 10.4–31.8)
TIBC: 353 ug/dL (ref 250–450)
UIBC: 270 ug/dL

## 2018-01-31 LAB — FERRITIN: FERRITIN: 249 ng/mL (ref 11–307)

## 2018-01-31 MED ORDER — FLUOROURACIL CHEMO INJECTION 2.5 GM/50ML
400.0000 mg/m2 | Freq: Once | INTRAVENOUS | Status: AC
  Administered 2018-01-31: 850 mg via INTRAVENOUS
  Filled 2018-01-31: qty 17

## 2018-01-31 MED ORDER — SODIUM CHLORIDE 0.9 % IV SOLN: 10.0000 mg | Freq: Once | INTRAVENOUS | Status: DC

## 2018-01-31 MED ORDER — SODIUM CHLORIDE 0.9 % IV SOLN
2400.0000 mg/m2 | INTRAVENOUS | Status: DC
  Administered 2018-01-31: 5000 mg via INTRAVENOUS
  Filled 2018-01-31: qty 100

## 2018-01-31 MED ORDER — DEXTROSE 5 % IV SOLN
Freq: Once | INTRAVENOUS | Status: AC
  Administered 2018-01-31: 11:00:00 via INTRAVENOUS
  Filled 2018-01-31: qty 1000

## 2018-01-31 MED ORDER — DEXTROSE 5 % IV SOLN
65.0000 mg/m2 | Freq: Once | INTRAVENOUS | Status: AC
  Administered 2018-01-31: 135 mg via INTRAVENOUS
  Filled 2018-01-31: qty 20

## 2018-01-31 MED ORDER — LEUCOVORIN CALCIUM INJECTION 350 MG
850.0000 mg | Freq: Once | INTRAVENOUS | Status: AC
  Administered 2018-01-31: 850 mg via INTRAVENOUS
  Filled 2018-01-31: qty 10

## 2018-01-31 MED ORDER — DEXAMETHASONE SODIUM PHOSPHATE 10 MG/ML IJ SOLN
10.0000 mg | Freq: Once | INTRAMUSCULAR | Status: AC
  Administered 2018-01-31: 10 mg via INTRAVENOUS
  Filled 2018-01-31: qty 1

## 2018-01-31 MED ORDER — PALONOSETRON HCL INJECTION 0.25 MG/5ML
0.2500 mg | Freq: Once | INTRAVENOUS | Status: AC
  Administered 2018-01-31: 0.25 mg via INTRAVENOUS
  Filled 2018-01-31: qty 5

## 2018-01-31 NOTE — Progress Notes (Signed)
Here for follow up. Overall stated doing well. Bp elevated- will repeat -pt aware.asymptomatic she stated .  Stated has had occasional small nose bleeds- last one yesterday -goes away quickly per pt. Stated woke up w headache this am-took tylenol and headache resolved. 1050 am- pt went to chemo suite-call to [facility] charge and informed of elevated BP-asked per Dr Janese Banks request to repeat it-stated she would follow up.

## 2018-01-31 NOTE — Progress Notes (Signed)
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 Vicente Males from GI for recurrent episodes of diverticulitis sinceaugust2018 which was treated with antibiotics. However patient continued to have some abdominal cramping along with some bleeding in her stool and constipation alternating with diarrhea. Patient underwent flexible sigmoidoscopy on 11/07/2017. A circumferential infiltrative completely obstructing large mass was found in the rectosigmoid colon.Biopsy showed at least high-grade dysplasia. Patient was referred to Dr. Derinda Sis consideration of hemicolectomy.  Patient underwent laparoscopic low anterior resection along with small bowel resection and laparoscopic takedown of the splenic flexure on 11/11/2017. 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 Tumor size was 3 cm, moderately differentiated. All margins were negative for invasive carcinoma. Perineural invasion and lymphovascular invasion was present. 1 out of 17 lymph nodes was positive for malignancy. Pathologic stage was pT4b pN1a. MSI stable  Preoperative CEA is not available. CT abdomen on 10/26/2017 showed residual changes of diverticulitis although improved when compared to prior exam. No perforation or abscess is identified.  No family history of colon cancer. History of breast cancer in her mother in her 21s. Genetic testing was  negative   Interval history-reports that symptoms of cold sensitivity were much improved over the last 2 weeks.  Bowel movements are regular.  She does have some mild fatigue.  She had 2 episodes of nosebleeds which were mild and self-limited.  ECOG PS- 0 Pain scale- 0 Opioid associated constipation- no  Review of systems- Review of Systems  Constitutional: Negative for chills, fever, malaise/fatigue and weight loss.  HENT: Positive for nosebleeds. Negative for congestion and ear discharge.   Eyes: Negative for blurred vision.  Respiratory: Negative for cough, hemoptysis, sputum production, shortness of breath and wheezing.   Cardiovascular: Negative for chest pain, palpitations, orthopnea and claudication.  Gastrointestinal: Negative for abdominal pain, blood in stool, constipation, diarrhea, heartburn, melena, nausea and vomiting.  Genitourinary: Negative for dysuria, flank pain, frequency, hematuria and urgency.  Musculoskeletal: Negative for back pain, joint pain and myalgias.  Skin: Negative for rash.  Neurological: Negative for dizziness, tingling, focal weakness, seizures, weakness and headaches.  Endo/Heme/Allergies: Does not bruise/bleed easily.  Psychiatric/Behavioral: Negative for depression and suicidal ideas. The patient does not have insomnia.       No Known Allergies   Past Medical History:  Diagnosis Date  . Anemia   . Cancer of sigmoid colon ([place]) 11/11/2017   Partial sigmoid colon resection.   . Diverticulosis   . Genetic testing 12/14/2017   Common Cancers panel (47 genes) @ [facility] - No pathogenic mutations detected  . Headache   . Heart murmur   . Hyperlipidemia   . Hypothyroidism   . Thyroid disease      Past Surgical History:  Procedure Laterality Date  . APPENDECTOMY     possible during hysterectomy  . BOWEL RESECTION N/A 11/11/2017   Procedure: SMALL BOWEL RESECTION;  Surgeon: Jules Husbands, MD;  Location: [facility] ORS;  Service: General;   Laterality: N/A;  . FLEXIBLE SIGMOIDOSCOPY N/A 11/07/2017  Procedure: FLEXIBLE SIGMOIDOSCOPY;  Surgeon: Jonathon Bellows, MD;  Location: [facility] ENDOSCOPY;  Service: Gastroenterology;  Laterality: N/A;  . LAPAROSCOPIC SIGMOID COLECTOMY N/A 11/11/2017   Procedure: LAPAROSCOPIC SIGMOID COLECTOMY;  Surgeon: Jules Husbands, MD;  Location: [facility] ORS;  Service: General;  Laterality: N/A;  . MOUTH SURGERY    . PORTACATH PLACEMENT Right 11/29/2017   Procedure: INSERTION PORT-A-CATH;  Surgeon: Jules Husbands, MD;  Location: [place];  Service: General;  Laterality: Right;  May do local and IV sedation. Please have U/S  . TOTAL ABDOMINAL HYSTERECTOMY  05/20/2014   ooperectomy unilateral    Social History   Socioeconomic History  . Marital status: Married    Spouse name: Not on file  . Number of children: Not on file  . Years of education: Not on file  . Highest education level: Not on file  Social Needs  . Financial resource strain: Not on file  . Food insecurity - worry: Not on file  . Food insecurity - inability: Not on file  . Transportation needs - medical: Not on file  . Transportation needs - non-medical: Not on file  Occupational History  . Not on file  Tobacco Use  . Smoking status: Never Smoker  . Smokeless tobacco: Never Used  Substance and Sexual Activity  . Alcohol use: No    Alcohol/week: 0.0 oz  . Drug use: No  . Sexual activity: Yes  Other Topics Concern  . Not on file  Social History Narrative  . Not on file    Family History  Problem Relation Age of Onset  . Breast cancer Mother 26       currently 78  . Hypertension Mother   . Thyroid disease Brother   . Hernia Brother   . Thyroid cancer Maternal Grandmother 72       deceased 47  . Stomach cancer Paternal Grandfather        unconfirmed; deceased 33  . Stomach cancer Paternal Aunt 50       deceased 29  . Colon cancer Maternal Aunt 68       also lung cancer  . Stomach cancer Maternal Uncle 68      Current Outpatient Medications:  .  APPLE CIDER VINEGAR PO, Take 1 tablet by mouth daily., Disp: , Rfl:  .  Biotin 10 MG TABS, Take 2 tablets by mouth daily. , Disp: , Rfl:  .  dexamethasone (DECADRON) 4 MG tablet, Take 2 tablets (8 mg total) by mouth daily. Start the day after chemotherapy for 2 days. Take with food., Disp: 30 tablet, Rfl: 1 .  levothyroxine (SYNTHROID, LEVOTHROID) 175 MCG tablet, Take 1 tablet (175 mcg total) by mouth daily before breakfast., Disp: 30 tablet, Rfl: 11 .  lidocaine-prilocaine (EMLA) cream, Apply to affected area once, Disp: 30 g, Rfl: 3 .  Multiple Vitamin tablet, Take 2 tablets by mouth daily. , Disp: , Rfl:  .  OMEGA-3 FATTY ACIDS PO, Take 2 capsules by mouth daily. , Disp: , Rfl:  .  prochlorperazine (COMPAZINE) 10 MG tablet, Take 1 tablet (10 mg total) by mouth every 6 (six) hours as needed (Nausea or vomiting)., Disp: 30 tablet, Rfl: 1 .  VITAMIN D, CHOLECALCIFEROL, PO, Take 1 tablet by mouth daily., Disp: , Rfl:  .  LORazepam (ATIVAN) 0.5 MG tablet, Take 1 tablet (0.5 mg total) by mouth every 6 (six) hours as needed (Nausea or vomiting). (Patient not taking: Reported on 01/31/2018), Disp: 30 tablet, Rfl: 0 .  ondansetron (ZOFRAN) 8 MG tablet, Take 1 tablet (8 mg total) by mouth 2 (two) times daily as needed for refractory nausea / vomiting. Start on day 3 after chemotherapy. (Patient not taking: Reported on 01/31/2018), Disp: 30 tablet, Rfl: 1 No current facility-administered medications for this visit.   Facility-Administered Medications Ordered in Other Visits:  .  fluorouracil (ADRUCIL) 5,000 mg in sodium chloride 0.9 % 150 mL chemo infusion, 2,400 mg/m2 (Treatment Plan Recorded), Intravenous, 1 day or 1 dose, Sindy Guadeloupe, MD .  fluorouracil (ADRUCIL) chemo injection 850 mg, 400 mg/m2 (Treatment Plan Recorded), Intravenous, Once, Sindy Guadeloupe, MD  Physical exam:  Vitals:   01/31/18 0951  BP: (!) 168/100  Pulse: 87  Resp: 18  Temp: (!) 97.3  F (36.3 C)  TempSrc: Tympanic  Weight: 208 lb 9.6 oz (94.6 kg)   Physical Exam  Constitutional: She is oriented to person, place, and time and well-developed, well-nourished, and in no distress.  HENT:  Head: Normocephalic and atraumatic.  Eyes: EOM are normal. Pupils are equal, round, and reactive to light.  Neck: Normal range of motion.  Cardiovascular: Normal rate, regular rhythm and normal heart sounds.  Pulmonary/Chest: Effort normal and breath sounds normal.  Abdominal: Soft. Bowel sounds are normal.  Neurological: She is alert and oriented to person, place, and time.  Skin: Skin is warm and dry.     CMP Latest Ref Rng & Units 01/31/2018  Glucose 65 - 99 mg/dL 116(H)  BUN 6 - 20 mg/dL 7  Creatinine 0.44 - 1.00 mg/dL 0.61  Sodium 135 - 145 mmol/L 137  Potassium 3.5 - 5.1 mmol/L 3.7  Chloride 101 - 111 mmol/L 105  CO2 22 - 32 mmol/L 25  Calcium 8.9 - 10.3 mg/dL 8.9  Total Protein 6.5 - 8.1 g/dL 6.6  Total Bilirubin 0.3 - 1.2 mg/dL 0.4  Alkaline Phos 38 - 126 U/L 116  AST 15 - 41 U/L 47(H)  ALT 14 - 54 U/L 81(H)   CBC Latest Ref Rng & Units 01/31/2018  WBC 3.6 - 11.0 K/uL 7.2  Hemoglobin 12.0 - 16.0 g/dL 12.4  Hematocrit 35.0 - 47.0 % 37.2  Platelets 150 - 440 K/uL 119(L)    No images are attached to the encounter.  Mr Abdomen W Wo Contrast  Result Date: 01/05/2018 CLINICAL DATA:  Elevated liver function tests. Colon carcinoma. Undergoing chemotherapy. EXAM: MRI ABDOMEN WITHOUT AND WITH CONTRAST TECHNIQUE: Multiplanar multisequence MR imaging of the abdomen was performed both before and after the administration of intravenous contrast. CONTRAST:  84m EOVIST GADOXETATE DISODIUM 0.25 MOL/L IV SOLN COMPARISON:  None. FINDINGS: Lower chest: No acute findings. Hepatobiliary: No hepatic masses identified. The liver shows diffuse T2 hypointense signal, and T1 hypointense signal on inphase gradient echo images, consistent with iron deposition. Sparing of the spleen, pancreas,  and bone marrow noted, raising suspicion for primary hemochromatosis. No gross morphologic changes of cirrhosis identified. Numerous small gallstones are seen, however there is no evidence of cholecystitis or biliary ductal dilatation. Pancreas: Normal signal intensity. No mass or inflammatory changes. Spleen: Within normal limits in size and appearance. Normal signal intensity. Adrenals/Urinary Tract: No masses identified. No evidence of hydronephrosis. Stomach/Bowel: Visualized abdominal bowel unremarkable. Vascular/Lymphatic: No pathologically enlarged lymph nodes identified. No abdominal aortic aneurysm. Other:  None. Musculoskeletal:  No suspicious bone lesions identified. IMPRESSION: No evidence of hepatic or other abdominal metastatic disease. Cholelithiasis. No radiographic evidence of cholecystitis or biliary dilatation. Diffuse hepatic iron deposition, suspicious for primary hemochromatosis. Electronically Signed  By: Earle Gell M.D.   On: 01/05/2018 12:41     Assessment and plan- Patient is a 47 y.o. 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  Patient did have significant pain and side effects from the last and would like to hold off for this cycle.  She may need it with subsequent cycles again.  We did give it to her for cycle #2 and 3.    LFTs are mildly abnormal but overall improved as compared to prior values.  This was likely secondary to oxaliplatin  Mild thrombocytopenia also likely secondary to oxaliplatin.  Continue to monitor  Iron deficiency anemia: Status post 2 doses of Feraheme iron studies today are normal.  Anemia has resolved  Chemo-induced peripheral neuropathy: Currently intermittent and does not affect her quality of life.  Oxaliplatin was dose reduced to 65 mg/m  square which we will continue  I will see her back in 2 weeks time with CBC and CMP for cycle #6 of FOLFOX chemotherapy and possible Neulasta   Visit Diagnosis 1. Cancer of sigmoid ([place])   2. Encounter for antineoplastic chemotherapy   3. Chemotherapy-induced thrombocytopenia   4. Chemotherapy-induced peripheral neuropathy ([facility])      Dr. Randa Evens, MD, MPH [facility] at [facility] Pager- [phone] 01/31/2018 1:47 PM

## 2018-02-02 ENCOUNTER — Inpatient Hospital Stay: Payer: Managed Care, Other (non HMO)

## 2018-02-02 VITALS — BP 128/81 | HR 82 | Temp 97.6°F

## 2018-02-02 DIAGNOSIS — Z5111 Encounter for antineoplastic chemotherapy: Secondary | ICD-10-CM | POA: Diagnosis not present

## 2018-02-02 DIAGNOSIS — C187 Malignant neoplasm of sigmoid colon: Secondary | ICD-10-CM

## 2018-02-02 MED ORDER — HEPARIN SOD (PORK) LOCK FLUSH 100 UNIT/ML IV SOLN
INTRAVENOUS | Status: AC
  Filled 2018-02-02: qty 5

## 2018-02-02 MED ORDER — HEPARIN SOD (PORK) LOCK FLUSH 100 UNIT/ML IV SOLN
500.0000 [IU] | Freq: Once | INTRAVENOUS | Status: AC | PRN
  Administered 2018-02-02: 500 [IU]

## 2018-02-02 MED ORDER — SODIUM CHLORIDE 0.9% FLUSH
10.0000 mL | INTRAVENOUS | Status: DC | PRN
  Administered 2018-02-02: 10 mL
  Filled 2018-02-02: qty 10

## 2018-02-14 ENCOUNTER — Inpatient Hospital Stay: Payer: Managed Care, Other (non HMO)

## 2018-02-14 ENCOUNTER — Encounter: Payer: Self-pay | Admitting: Oncology

## 2018-02-14 ENCOUNTER — Inpatient Hospital Stay (HOSPITAL_BASED_OUTPATIENT_CLINIC_OR_DEPARTMENT_OTHER): Payer: Managed Care, Other (non HMO) | Admitting: Oncology

## 2018-02-14 ENCOUNTER — Other Ambulatory Visit: Payer: Self-pay

## 2018-02-14 VITALS — BP 162/102 | HR 80 | Temp 97.9°F | Wt 208.4 lb

## 2018-02-14 DIAGNOSIS — R945 Abnormal results of liver function studies: Secondary | ICD-10-CM | POA: Diagnosis not present

## 2018-02-14 DIAGNOSIS — C187 Malignant neoplasm of sigmoid colon: Secondary | ICD-10-CM

## 2018-02-14 DIAGNOSIS — R7989 Other specified abnormal findings of blood chemistry: Secondary | ICD-10-CM

## 2018-02-14 DIAGNOSIS — Z5111 Encounter for antineoplastic chemotherapy: Secondary | ICD-10-CM

## 2018-02-14 LAB — CBC WITH DIFFERENTIAL/PLATELET
Basophils Absolute: 0 10*3/uL (ref 0–0.1)
Basophils Relative: 0 %
EOS PCT: 5 %
Eosinophils Absolute: 0.2 10*3/uL (ref 0–0.7)
HCT: 35.6 % (ref 35.0–47.0)
HEMOGLOBIN: 12.2 g/dL (ref 12.0–16.0)
LYMPHS ABS: 1.6 10*3/uL (ref 1.0–3.6)
LYMPHS PCT: 43 %
MCH: 30.5 pg (ref 26.0–34.0)
MCHC: 34.1 g/dL (ref 32.0–36.0)
MCV: 89.3 fL (ref 80.0–100.0)
Monocytes Absolute: 0.5 10*3/uL (ref 0.2–0.9)
Monocytes Relative: 13 %
Neutro Abs: 1.5 10*3/uL (ref 1.4–6.5)
Neutrophils Relative %: 39 %
PLATELETS: 151 10*3/uL (ref 150–440)
RBC: 3.99 MIL/uL (ref 3.80–5.20)
RDW: 23 % — ABNORMAL HIGH (ref 11.5–14.5)
WBC: 3.8 10*3/uL (ref 3.6–11.0)

## 2018-02-14 LAB — COMPREHENSIVE METABOLIC PANEL
ALBUMIN: 3.4 g/dL — AB (ref 3.5–5.0)
ALT: 139 U/L — ABNORMAL HIGH (ref 14–54)
AST: 98 U/L — AB (ref 15–41)
Alkaline Phosphatase: 93 U/L (ref 38–126)
Anion gap: 5 (ref 5–15)
BUN: 8 mg/dL (ref 6–20)
CHLORIDE: 108 mmol/L (ref 101–111)
CO2: 27 mmol/L (ref 22–32)
Calcium: 8.8 mg/dL — ABNORMAL LOW (ref 8.9–10.3)
Creatinine, Ser: 0.59 mg/dL (ref 0.44–1.00)
GFR calc Af Amer: >60 mL/min (ref >60)
GLUCOSE: 78 mg/dL (ref 65–99)
POTASSIUM: 3.8 mmol/L (ref 3.5–5.1)
SODIUM: 140 mmol/L (ref 135–145)
Total Bilirubin: 0.4 mg/dL (ref 0.3–1.2)
Total Protein: 6.6 g/dL (ref 6.5–8.1)

## 2018-02-14 MED ORDER — DEXAMETHASONE SODIUM PHOSPHATE 10 MG/ML IJ SOLN
10.0000 mg | Freq: Once | INTRAMUSCULAR | Status: AC
  Administered 2018-02-14: 10 mg via INTRAVENOUS
  Filled 2018-02-14: qty 1

## 2018-02-14 MED ORDER — LEUCOVORIN CALCIUM INJECTION 350 MG
850.0000 mg | Freq: Once | INTRAVENOUS | Status: AC
  Administered 2018-02-14: 850 mg via INTRAVENOUS
  Filled 2018-02-14: qty 35

## 2018-02-14 MED ORDER — DEXTROSE 5 % IV SOLN
Freq: Once | INTRAVENOUS | Status: AC
  Administered 2018-02-14: 12:00:00 via INTRAVENOUS
  Filled 2018-02-14: qty 1000

## 2018-02-14 MED ORDER — OXALIPLATIN CHEMO INJECTION 100 MG/20ML
65.0000 mg/m2 | Freq: Once | INTRAVENOUS | Status: AC
  Administered 2018-02-14: 135 mg via INTRAVENOUS
  Filled 2018-02-14: qty 20

## 2018-02-14 MED ORDER — FLUOROURACIL CHEMO INJECTION 2.5 GM/50ML
400.0000 mg/m2 | Freq: Once | INTRAVENOUS | Status: AC
  Administered 2018-02-14: 850 mg via INTRAVENOUS
  Filled 2018-02-14: qty 17

## 2018-02-14 MED ORDER — PALONOSETRON HCL INJECTION 0.25 MG/5ML
0.2500 mg | Freq: Once | INTRAVENOUS | Status: AC
  Administered 2018-02-14: 0.25 mg via INTRAVENOUS
  Filled 2018-02-14: qty 5

## 2018-02-14 MED ORDER — SODIUM CHLORIDE 0.9 % IV SOLN: 10.0000 mg | Freq: Once | INTRAVENOUS | Status: DC

## 2018-02-14 MED ORDER — SODIUM CHLORIDE 0.9 % IV SOLN
2400.0000 mg/m2 | INTRAVENOUS | Status: DC
  Administered 2018-02-14: 5000 mg via INTRAVENOUS
  Filled 2018-02-14: qty 100

## 2018-02-14 NOTE — Progress Notes (Signed)
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 Vicente Males from GI for recurrent episodes of diverticulitis sinceaugust2018 which was treated with antibiotics. However patient continued to have some abdominal cramping along with some bleeding in her stool and constipation alternating with diarrhea. Patient underwent flexible sigmoidoscopy on 11/07/2017. A circumferential infiltrative completely obstructing large mass was found in the rectosigmoid colon.Biopsy showed at least high-grade dysplasia. Patient was referred to Dr. Derinda Sis consideration of hemicolectomy.  Patient underwent laparoscopic low anterior resection along with small bowel resection and laparoscopic takedown of the splenic flexure on 11/11/2017. 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 Tumor size was 3 cm, moderately differentiated. All margins were negative for invasive carcinoma. Perineural invasion and lymphovascular invasion was present. 1 out of 17 lymph nodes was positive for malignancy. Pathologic stage was pT4b pN1a. MSI stable  Preoperative CEA is not available. CT abdomen on 10/26/2017 showed residual changes of diverticulitis although improved when compared to prior exam. No perforation or abscess is identified.  No family history of colon cancer. History of breast cancer in her mother in her 38s. Genetic testing was  negative  Interval history-overall she feels well and reports that symptoms of cold sensitivity are far and few only when she touches cold objects but not otherwise.  Denies any fatigue.  Appetite is good and she denies any abdominal pain.  She has constipation for a few days following chemotherapy which resolved with laxatives and then she has a few days of loose stools after that but no frank diarrhea  ECOG PS- 0 Pain scale- 0 Opioid associated constipation- no  Review of systems- Review of Systems  Constitutional: Negative for chills, fever, malaise/fatigue and weight loss.  HENT: Negative for congestion, ear discharge and nosebleeds.   Eyes: Negative for blurred vision.  Respiratory: Negative for cough, hemoptysis, sputum production, shortness of breath and wheezing.   Cardiovascular: Negative for chest pain, palpitations, orthopnea and claudication.  Gastrointestinal: Negative for abdominal pain, blood in stool, constipation, diarrhea, heartburn, melena, nausea and vomiting.  Genitourinary: Negative for dysuria, flank pain, frequency, hematuria and urgency.  Musculoskeletal: Negative for back pain, joint pain and myalgias.  Skin: Negative for rash.  Neurological: Negative for dizziness, tingling, focal weakness, seizures, weakness and headaches.  Endo/Heme/Allergies: Does not bruise/bleed easily.  Psychiatric/Behavioral: Negative for depression and suicidal ideas. The patient does not have insomnia.      No Known Allergies   Past Medical History:  Diagnosis Date  . Anemia   . Cancer of sigmoid colon ([place]) 11/11/2017   Partial sigmoid colon resection.   . Diverticulosis   . Genetic testing 12/14/2017   Common Cancers panel (47 genes) @ [facility] - No pathogenic mutations detected  . Headache   . Heart murmur   . Hyperlipidemia   . Hypothyroidism   . Thyroid disease      Past Surgical History:  Procedure Laterality Date  . APPENDECTOMY     possible during hysterectomy   . BOWEL RESECTION N/A 11/11/2017   Procedure:  SMALL BOWEL RESECTION;  Surgeon: Jules Husbands, MD;  Location: [facility] ORS;  Service: General;  Laterality: N/A;  . FLEXIBLE SIGMOIDOSCOPY N/A 11/07/2017   Procedure: Beryle Quant;  Surgeon: Jonathon Bellows, MD;  Location: [facility] ENDOSCOPY;  Service: Gastroenterology;  Laterality: N/A;  . LAPAROSCOPIC SIGMOID COLECTOMY N/A 11/11/2017   Procedure: LAPAROSCOPIC SIGMOID COLECTOMY;  Surgeon: Jules Husbands, MD;  Location: [facility] ORS;  Service: General;  Laterality: N/A;  . MOUTH SURGERY    . PORTACATH PLACEMENT Right 11/29/2017   Procedure: INSERTION PORT-A-CATH;  Surgeon: Jules Husbands, MD;  Location: [place];  Service: General;  Laterality: Right;  May do local and IV sedation. Please have U/S  . TOTAL ABDOMINAL HYSTERECTOMY  05/20/2014   ooperectomy unilateral    Social History   Socioeconomic History  . Marital status: Married    Spouse name: Not on file  . Number of children: Not on file  . Years of education: Not on file  . Highest education level: Not on file  Social Needs  . Financial resource strain: Not on file  . Food insecurity - worry: Not on file  . Food insecurity - inability: Not on file  . Transportation needs - medical: Not on file  . Transportation needs - non-medical: Not on file  Occupational History  . Not on file  Tobacco Use  . Smoking status: Never Smoker  . Smokeless tobacco: Never Used  Substance and Sexual Activity  . Alcohol use: No    Alcohol/week: 0.0 oz  . Drug use: No  . Sexual activity: Yes  Other Topics Concern  . Not on file  Social History Narrative  . Not on file    Family History  Problem Relation Age of Onset  . Breast cancer Mother 46       currently 29  . Hypertension Mother   . Thyroid disease Brother   . Hernia Brother   . Thyroid cancer Maternal Grandmother 27       deceased 48  . Stomach cancer Paternal Grandfather        unconfirmed; deceased 86  . Stomach  cancer Paternal Aunt 57       deceased 64  . Colon cancer Maternal Aunt 68       also lung cancer  . Stomach cancer Maternal Uncle 60     Current Outpatient Medications:  .  APPLE CIDER VINEGAR PO, Take 1 tablet by mouth daily., Disp: , Rfl:  .  Biotin 10 MG TABS, Take 2 tablets by mouth daily. , Disp: , Rfl:  .  dexamethasone (DECADRON) 4 MG tablet, Take 2 tablets (8 mg total) by mouth daily. Start the day after chemotherapy for 2 days. Take with food., Disp: 30 tablet, Rfl: 1 .  levothyroxine (SYNTHROID, LEVOTHROID) 175 MCG tablet, Take 1 tablet (175 mcg total) by mouth daily before breakfast., Disp: 30 tablet, Rfl: 11 .  lidocaine-prilocaine (EMLA) cream, Apply to affected area once, Disp: 30 g, Rfl: 3 .  LORazepam (ATIVAN) 0.5 MG tablet, Take 1 tablet (0.5 mg total) by mouth every 6 (six) hours as needed (Nausea or vomiting)., Disp: 30 tablet, Rfl: 0 .  Multiple Vitamin tablet, Take 2 tablets by mouth daily. , Disp: , Rfl:  .  OMEGA-3 FATTY ACIDS PO, Take 2 capsules by mouth daily. , Disp: , Rfl:  .  ondansetron (ZOFRAN) 8 MG tablet, Take 1 tablet (8 mg total) by mouth 2 (two) times daily as needed for refractory nausea / vomiting.  Start on day 3 after chemotherapy., Disp: 30 tablet, Rfl: 1 .  prochlorperazine (COMPAZINE) 10 MG tablet, Take 1 tablet (10 mg total) by mouth every 6 (six) hours as needed (Nausea or vomiting)., Disp: 30 tablet, Rfl: 1 .  VITAMIN D, CHOLECALCIFEROL, PO, Take 1 tablet by mouth daily., Disp: , Rfl:  No current facility-administered medications for this visit.   Facility-Administered Medications Ordered in Other Visits:  .  fluorouracil (ADRUCIL) 5,000 mg in sodium chloride 0.9 % 150 mL chemo infusion, 2,400 mg/m2 (Treatment Plan Recorded), Intravenous, 1 day or 1 dose, Sindy Guadeloupe, MD .  fluorouracil (ADRUCIL) chemo injection 850 mg, 400 mg/m2 (Treatment Plan Recorded), Intravenous, Once, Sindy Guadeloupe, MD .  leucovorin 850 mg in dextrose 5 % 250 mL  infusion, 850 mg, Intravenous, Once, Sindy Guadeloupe, MD, Last Rate: 146 mL/hr at 02/14/18 1211, 850 mg at 02/14/18 1211 .  oxaliplatin (ELOXATIN) 135 mg in dextrose 5 % 500 mL chemo infusion, 65 mg/m2 (Treatment Plan Recorded), Intravenous, Once, Sindy Guadeloupe, MD, Last Rate: 264 mL/hr at 02/14/18 1212, 135 mg at 02/14/18 1212  Physical exam:  Vitals:   02/14/18 1048  BP: (!) 162/102  Pulse: 80  Temp: 97.9 F (36.6 C)  TempSrc: Tympanic  Weight: 208 lb 6.4 oz (94.5 kg)   Physical Exam  Constitutional: She is oriented to person, place, and time and well-developed, well-nourished, and in no distress.  HENT:  Head: Normocephalic and atraumatic.  Eyes: EOM are normal. Pupils are equal, round, and reactive to light.  Neck: Normal range of motion.  Cardiovascular: Normal rate, regular rhythm and normal heart sounds.  Pulmonary/Chest: Effort normal and breath sounds normal.  Abdominal: Soft. Bowel sounds are normal.  Neurological: She is alert and oriented to person, place, and time.  Skin: Skin is warm and dry.     CMP Latest Ref Rng & Units 02/14/2018  Glucose 65 - 99 mg/dL 78  BUN 6 - 20 mg/dL 8  Creatinine 0.44 - 1.00 mg/dL 0.59  Sodium 135 - 145 mmol/L 140  Potassium 3.5 - 5.1 mmol/L 3.8  Chloride 101 - 111 mmol/L 108  CO2 22 - 32 mmol/L 27  Calcium 8.9 - 10.3 mg/dL 8.8(L)  Total Protein 6.5 - 8.1 g/dL 6.6  Total Bilirubin 0.3 - 1.2 mg/dL 0.4  Alkaline Phos 38 - 126 U/L 93  AST 15 - 41 U/L 98(H)  ALT 14 - 54 U/L 139(H)   CBC Latest Ref Rng & Units 02/14/2018  WBC 3.6 - 11.0 K/uL 3.8  Hemoglobin 12.0 - 16.0 g/dL 12.2  Hematocrit 35.0 - 47.0 % 35.6  Platelets 150 - 440 K/uL 151      Assessment and plan- Patient is a 47 y.o. 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  Platelet  counts and hemoglobin are normal.  CMP shows elevated AST and ALT at 98 and 139 respectively increased from a prior value of 47 and 81.  She has done that in the past and when we gave her a break from chemotherapy for a week and dose reduce her oxaliplatin her LFTs did come down.  She did have hepatitis B and C workup which was negative an MRI of her liver also did not show any evidence of metastatic disease.  I will therefore proceed with  cycle #5 of FOLFOX chemotherapy today.  She will get Neulasta on day 3 of pump disconnect as her [place] is 1.5 today.  Continue reduced dose of oxaliplatin at 65 mg/m square  I will see her back in 2 weeks time with CBC and CMP tentatively for cycle #6 of FOLFOX chemotherapy.  If her LFTs are any higher at that time I will have to again give her a treatment break of 1-2 weeks for her LFTs to improve before we can proceed with further cycles.  Patient is in understanding of the plan    Visit Diagnosis 1. Cancer of sigmoid ([place])   2. Encounter for antineoplastic chemotherapy   3. Abnormal LFTs      Dr. Randa Evens, MD, MPH [facility] at [facility] Pager- [phone] 02/14/2018 1:19 PM

## 2018-02-14 NOTE — Progress Notes (Signed)
Proceed with today's treatment per Dr. Janese Banks.  She is aware of all lab values today

## 2018-02-16 ENCOUNTER — Inpatient Hospital Stay: Payer: Managed Care, Other (non HMO)

## 2018-02-16 VITALS — BP 132/85 | HR 81 | Temp 98.4°F | Resp 18

## 2018-02-16 DIAGNOSIS — C187 Malignant neoplasm of sigmoid colon: Secondary | ICD-10-CM

## 2018-02-16 DIAGNOSIS — Z5111 Encounter for antineoplastic chemotherapy: Secondary | ICD-10-CM | POA: Diagnosis not present

## 2018-02-16 MED ORDER — SODIUM CHLORIDE 0.9% FLUSH
10.0000 mL | INTRAVENOUS | Status: DC | PRN
  Administered 2018-02-16: 10 mL
  Filled 2018-02-16: qty 10

## 2018-02-16 MED ORDER — HEPARIN SOD (PORK) LOCK FLUSH 100 UNIT/ML IV SOLN
500.0000 [IU] | Freq: Once | INTRAVENOUS | Status: AC | PRN
  Administered 2018-02-16: 500 [IU]
  Filled 2018-02-16: qty 5

## 2018-02-16 MED ORDER — PEGFILGRASTIM INJECTION 6 MG/0.6ML ~~LOC~~
6.0000 mg | PREFILLED_SYRINGE | Freq: Once | SUBCUTANEOUS | Status: AC
  Administered 2018-02-16: 6 mg via SUBCUTANEOUS
  Filled 2018-02-16: qty 0.6

## 2018-02-27 NOTE — Progress Notes (Signed)
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 Vicente Males from GI for recurrent episodes of diverticulitis sinceaugust2018 which was treated with antibiotics. However patient continued to have some abdominal cramping along with some bleeding in her stool and constipation alternating with diarrhea. Patient underwent flexible sigmoidoscopy on 11/07/2017. A circumferential infiltrative completely obstructing large mass was found in the rectosigmoid colon.Biopsy showed at least high-grade dysplasia. Patient was referred to Dr. Derinda Sis consideration of hemicolectomy.  Patient underwent laparoscopic low anterior resection along with small bowel resection and laparoscopic takedown of the splenic flexure on 11/11/2017. 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 Tumor size was 3 cm, moderately differentiated. All margins were negative for invasive carcinoma. Perineural invasion and lymphovascular invasion was present. 1 out of 17 lymph nodes was positive for malignancy. Pathologic stage was pT4b pN1a. MSI stable  Preoperative CEA is not available. CT abdomen on 10/26/2017 showed residual changes of diverticulitis although improved when compared to prior exam. No perforation or abscess is identified.  No family history of colon cancer. History of breast cancer in her mother in her 23s. Genetic testing was  negative   Interval history- she has been having more self limited nose bleeds sometimes 2-3 times per day that lasts for about 5-10 min and stops. She has been using saline spray and vaseline with not much relief. Denies any nose bleeds prior to chemotherapy. She has not had menorrhagia in the past and no excessive bleeding noted post surgery in the past  ECOG PS- 0 Pain scale- 0   Review of systems- Review of Systems  Constitutional: Negative for chills, fever, malaise/fatigue and weight loss.  HENT: Positive for nosebleeds. Negative for congestion and ear discharge.   Eyes: Negative for blurred vision.  Respiratory: Negative for cough, hemoptysis, sputum production, shortness of breath and wheezing.   Cardiovascular: Negative for chest pain, palpitations, orthopnea and claudication.  Gastrointestinal: Negative for abdominal pain, blood in stool, constipation, diarrhea, heartburn, melena, nausea and vomiting.  Genitourinary: Negative for dysuria, flank pain, frequency, hematuria and urgency.  Musculoskeletal: Negative for back pain, joint pain and myalgias.  Skin: Negative for rash.  Neurological: Negative for dizziness, tingling, focal weakness, seizures, weakness and headaches.  Endo/Heme/Allergies: Does not bruise/bleed easily.  Psychiatric/Behavioral: Negative for depression and suicidal ideas. The patient does not have insomnia.       No Known Allergies   Past Medical History:  Diagnosis Date  . Anemia   . Cancer of sigmoid colon ([place]) 11/11/2017   Partial sigmoid colon resection.   . Diverticulosis   . Genetic testing 12/14/2017   Common Cancers panel (47 genes) @ [facility] - No pathogenic mutations detected  . Headache   . Heart murmur   . Hyperlipidemia   . Hypothyroidism   . Thyroid disease      Past Surgical History:  Procedure Laterality Date  . APPENDECTOMY     possible during hysterectomy  . BOWEL RESECTION N/A 11/11/2017   Procedure: SMALL BOWEL  RESECTION;  Surgeon:  Jules Husbands, MD;  Location: [facility] ORS;  Service: General;  Laterality: N/A;  . FLEXIBLE SIGMOIDOSCOPY N/A 11/07/2017   Procedure: Beryle Quant;  Surgeon: Jonathon Bellows, MD;  Location: [facility] ENDOSCOPY;  Service: Gastroenterology;  Laterality: N/A;  . LAPAROSCOPIC SIGMOID COLECTOMY N/A 11/11/2017   Procedure: LAPAROSCOPIC SIGMOID COLECTOMY;  Surgeon: Jules Husbands, MD;  Location: [facility] ORS;  Service: General;  Laterality: N/A;  . MOUTH SURGERY    . PORTACATH PLACEMENT Right 11/29/2017   Procedure: INSERTION PORT-A-CATH;  Surgeon: Jules Husbands, MD;  Location: [place];  Service: General;  Laterality: Right;  May do local and IV sedation. Please have U/S  . TOTAL ABDOMINAL HYSTERECTOMY  05/20/2014   ooperectomy unilateral    Social History   Socioeconomic History  . Marital status: Married    Spouse name: Not on file  . Number of children: Not on file  . Years of education: Not on file  . Highest education level: Not on file  Social Needs  . Financial resource strain: Not on file  . Food insecurity - worry: Not on file  . Food insecurity - inability: Not on file  . Transportation needs - medical: Not on file  . Transportation needs - non-medical: Not on file  Occupational History  . Not on file  Tobacco Use  . Smoking status: Never Smoker  . Smokeless tobacco: Never Used  Substance and Sexual Activity  . Alcohol use: No    Alcohol/week: 0.0 oz  . Drug use: No  . Sexual activity: Yes  Other Topics Concern  . Not on file  Social History Narrative  . Not on file    Family History  Problem Relation Age of Onset  . Breast cancer Mother 77       currently 38  . Hypertension Mother   . Thyroid disease Brother   . Hernia Brother   . Thyroid cancer Maternal Grandmother 18       deceased 64  . Stomach cancer Paternal Grandfather        unconfirmed; deceased 17  . Stomach cancer Paternal Aunt 57       deceased 95  . Colon cancer  Maternal Aunt 68       also lung cancer  . Stomach cancer Maternal Uncle 23     Current Outpatient Medications:  .  APPLE CIDER VINEGAR PO, Take 1 tablet by mouth daily., Disp: , Rfl:  .  Biotin 10 MG TABS, Take 2 tablets by mouth daily. , Disp: , Rfl:  .  dexamethasone (DECADRON) 4 MG tablet, Take 2 tablets (8 mg total) by mouth daily. Start the day after chemotherapy for 2 days. Take with food., Disp: 30 tablet, Rfl: 1 .  levothyroxine (SYNTHROID, LEVOTHROID) 175 MCG tablet, Take 1 tablet (175 mcg total) by mouth daily before breakfast., Disp: 30 tablet, Rfl: 11 .  lidocaine-prilocaine (EMLA) cream, Apply to affected area once, Disp: 30 g, Rfl: 3 .  LORazepam (ATIVAN) 0.5 MG tablet, Take 1 tablet (0.5 mg total) by mouth every 6 (six) hours as needed (Nausea or vomiting)., Disp: 30 tablet, Rfl: 0 .  Multiple Vitamin tablet, Take 2 tablets by mouth daily. , Disp: , Rfl:  .  OMEGA-3 FATTY ACIDS PO, Take 2 capsules by mouth daily. , Disp: , Rfl:  .  ondansetron (ZOFRAN) 8 MG tablet, Take 1 tablet (8 mg total) by mouth 2 (two) times daily as needed for refractory nausea / vomiting. Start on day 3 after  chemotherapy., Disp: 30 tablet, Rfl: 1 .  prochlorperazine (COMPAZINE) 10 MG tablet, Take 1 tablet (10 mg total) by mouth every 6 (six) hours as needed (Nausea or vomiting)., Disp: 30 tablet, Rfl: 1 .  VITAMIN D, CHOLECALCIFEROL, PO, Take 1 tablet by mouth daily., Disp: , Rfl:   Physical exam:  Vitals:   02/28/18 0913  BP: 140/90  Pulse: 87  Resp: 18  Temp: (!) 97.2 F (36.2 C)  TempSrc: Tympanic  Weight: 209 lb 9.6 oz (95.1 kg)  Height: 5' 7"  (1.702 m)   Physical Exam  Constitutional: She is oriented to person, place, and time and well-developed, well-nourished, and in no distress.  HENT:  Head: Normocephalic and atraumatic.  Eyes: EOM are normal. Pupils are equal, round, and reactive to light.  Neck: Normal range of motion.  Cardiovascular: Normal rate, regular rhythm and normal  heart sounds.  Pulmonary/Chest: Effort normal and breath sounds normal.  Abdominal: Soft. Bowel sounds are normal.  Neurological: She is alert and oriented to person, place, and time.  Skin: Skin is warm and dry.     CMP Latest Ref Rng & Units 02/28/2018  Glucose 65 - 99 mg/dL 146(H)  BUN 6 - 20 mg/dL 8  Creatinine 0.44 - 1.00 mg/dL 0.67  Sodium 135 - 145 mmol/L 138  Potassium 3.5 - 5.1 mmol/L 3.6  Chloride 101 - 111 mmol/L 106  CO2 22 - 32 mmol/L 23  Calcium 8.9 - 10.3 mg/dL 8.8(L)  Total Protein 6.5 - 8.1 g/dL 6.6  Total Bilirubin 0.3 - 1.2 mg/dL 0.3  Alkaline Phos 38 - 126 U/L 128(H)  AST 15 - 41 U/L 158(H)  ALT 14 - 54 U/L 184(H)   CBC Latest Ref Rng & Units 02/28/2018  WBC 3.6 - 11.0 K/uL 7.9  Hemoglobin 12.0 - 16.0 g/dL 13.0  Hematocrit 35.0 - 47.0 % 37.8  Platelets 150 - 440 K/uL 80(L)     Assessment and plan- Patient is a 47 y.o. 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 I will therefore hold her chemotherapy today. rtc in 2 weeks with cbc/ cmp for cycle 6 of FOLFOX chemotherapy. She is already receiving reduced dose of oxaliplatin at 65 mg/meter square. MRI liver done recently showed no acute pathology.   Nose bleeds- no prior h/o nose bleeds, exceesive bleeding post surgery or menorrhagia prior to chemotherapy. Saline spray has not helped. She will use afrin spray prn if she has worsening nose bleeds. H/H stable. Continue to monitor. Will check coags, pfa and VW panel with next set of labs  Abnormal LFT's- likely due to oxaliplatin. See plan above    Visit Diagnosis 1. Cancer of sigmoid ([place])   2. Abnormal LFTs   3. Encounter for antineoplastic chemotherapy      Dr. Randa Evens, MD, MPH [facility] at [facility] Pager-  [phone] 02/28/2018 10:01 AM

## 2018-02-28 ENCOUNTER — Inpatient Hospital Stay (HOSPITAL_BASED_OUTPATIENT_CLINIC_OR_DEPARTMENT_OTHER): Payer: Managed Care, Other (non HMO) | Admitting: Oncology

## 2018-02-28 ENCOUNTER — Encounter: Payer: Self-pay | Admitting: Oncology

## 2018-02-28 ENCOUNTER — Inpatient Hospital Stay: Payer: Managed Care, Other (non HMO)

## 2018-02-28 ENCOUNTER — Inpatient Hospital Stay: Payer: Managed Care, Other (non HMO) | Attending: Oncology

## 2018-02-28 VITALS — BP 140/90 | HR 87 | Temp 97.2°F | Resp 18 | Ht 67.0 in | Wt 209.6 lb

## 2018-02-28 DIAGNOSIS — R7989 Other specified abnormal findings of blood chemistry: Secondary | ICD-10-CM

## 2018-02-28 DIAGNOSIS — C187 Malignant neoplasm of sigmoid colon: Secondary | ICD-10-CM

## 2018-02-28 DIAGNOSIS — Z5111 Encounter for antineoplastic chemotherapy: Secondary | ICD-10-CM | POA: Diagnosis present

## 2018-02-28 DIAGNOSIS — R945 Abnormal results of liver function studies: Secondary | ICD-10-CM | POA: Diagnosis not present

## 2018-02-28 DIAGNOSIS — R040 Epistaxis: Secondary | ICD-10-CM | POA: Diagnosis not present

## 2018-02-28 LAB — COMPREHENSIVE METABOLIC PANEL
ALT: 184 U/L — AB (ref 14–54)
AST: 158 U/L — AB (ref 15–41)
Albumin: 3.4 g/dL — ABNORMAL LOW (ref 3.5–5.0)
Alkaline Phosphatase: 128 U/L — ABNORMAL HIGH (ref 38–126)
Anion gap: 9 (ref 5–15)
BUN: 8 mg/dL (ref 6–20)
CHLORIDE: 106 mmol/L (ref 101–111)
CO2: 23 mmol/L (ref 22–32)
Calcium: 8.8 mg/dL — ABNORMAL LOW (ref 8.9–10.3)
Creatinine, Ser: 0.67 mg/dL (ref 0.44–1.00)
GFR calc non Af Amer: >60 mL/min (ref >60)
Glucose, Bld: 146 mg/dL — ABNORMAL HIGH (ref 65–99)
POTASSIUM: 3.6 mmol/L (ref 3.5–5.1)
SODIUM: 138 mmol/L (ref 135–145)
Total Bilirubin: 0.3 mg/dL (ref 0.3–1.2)
Total Protein: 6.6 g/dL (ref 6.5–8.1)

## 2018-02-28 LAB — CBC WITH DIFFERENTIAL/PLATELET
Basophils Absolute: 0.1 K/uL (ref 0–0.1)
Basophils Relative: 1 %
Eosinophils Absolute: 0.1 K/uL (ref 0–0.7)
Eosinophils Relative: 2 %
HCT: 37.8 % (ref 35.0–47.0)
Hemoglobin: 13 g/dL (ref 12.0–16.0)
Lymphocytes Relative: 31 %
Lymphs Abs: 2.4 K/uL (ref 1.0–3.6)
MCH: 31.8 pg (ref 26.0–34.0)
MCHC: 34.4 g/dL (ref 32.0–36.0)
MCV: 92.3 fL (ref 80.0–100.0)
Monocytes Absolute: 0.4 K/uL (ref 0.2–0.9)
Monocytes Relative: 6 %
Neutro Abs: 4.8 K/uL (ref 1.4–6.5)
Neutrophils Relative %: 60 %
Platelets: 80 K/uL — ABNORMAL LOW (ref 150–440)
RBC: 4.09 MIL/uL (ref 3.80–5.20)
RDW: 23.6 % — ABNORMAL HIGH (ref 11.5–14.5)
WBC: 7.9 K/uL (ref 3.6–11.0)

## 2018-02-28 MED ORDER — SODIUM CHLORIDE 0.9% FLUSH
10.0000 mL | INTRAVENOUS | Status: DC | PRN
  Administered 2018-02-28: 10 mL via INTRAVENOUS
  Filled 2018-02-28: qty 10

## 2018-02-28 MED ORDER — HEPARIN SOD (PORK) LOCK FLUSH 100 UNIT/ML IV SOLN
500.0000 [IU] | Freq: Once | INTRAVENOUS | Status: AC
  Administered 2018-02-28: 500 [IU] via INTRAVENOUS
  Filled 2018-02-28: qty 5

## 2018-02-28 NOTE — Progress Notes (Signed)
Several times a day and almost every day has small nose bleed that last 5 - 10 min and then stops. She has used nasal saline and vaseline and neither helped.

## 2018-03-02 ENCOUNTER — Inpatient Hospital Stay: Payer: Managed Care, Other (non HMO)

## 2018-03-13 NOTE — Progress Notes (Signed)
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 Vicente Males from GI for recurrent episodes of diverticulitis sinceaugust2018 which was treated with antibiotics. However patient continued to have some abdominal cramping along with some bleeding in her stool and constipation alternating with diarrhea. Patient underwent flexible sigmoidoscopy on 11/07/2017. A circumferential infiltrative completely obstructing large mass was found in the rectosigmoid colon.Biopsy showed at least high-grade dysplasia. Patient was referred to Dr. Derinda Sis consideration of hemicolectomy.  Patient underwent laparoscopic low anterior resection along with small bowel resection and laparoscopic takedown of the splenic flexure on 11/11/2017. 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 Tumor size was 3 cm, moderately differentiated. All margins were negative for invasive carcinoma. Perineural invasion and lymphovascular invasion was present. 1 out of 17 lymph nodes was positive for malignancy. Pathologic stage was pT4b pN1a. MSI stable  Preoperative CEA is not available. CT abdomen on 10/26/2017 showed residual changes of diverticulitis although improved when compared to prior exam. No perforation or abscess is identified.  No family history of colon cancer. History of breast cancer in her mother in her 74s. Genetic testing was  negative  Interval history- she feels great after chemo was held for 2 weeks. No neuropathy. Nose bleeds have not recurred. Denies any pain, fatigue or bowel/ bladder issues  ECOG PS- 0 Pain scale- 0   Review of systems- Review of Systems  Constitutional: Negative for chills, fever, malaise/fatigue and weight loss.  HENT: Negative for congestion, ear discharge and nosebleeds.   Eyes: Negative for blurred vision.  Respiratory: Negative for cough, hemoptysis, sputum production, shortness of breath and wheezing.   Cardiovascular: Negative for chest pain, palpitations, orthopnea and claudication.  Gastrointestinal: Negative for abdominal pain, blood in stool, constipation, diarrhea, heartburn, melena, nausea and vomiting.  Genitourinary: Negative for dysuria, flank pain, frequency, hematuria and urgency.  Musculoskeletal: Negative for back pain, joint pain and myalgias.  Skin: Negative for rash.  Neurological: Negative for dizziness, tingling, focal weakness, seizures, weakness and headaches.  Endo/Heme/Allergies: Does not bruise/bleed easily.  Psychiatric/Behavioral: Negative for depression and suicidal ideas. The patient does not have insomnia.      No Known Allergies   Past Medical History:  Diagnosis Date  . Anemia   . Cancer of sigmoid colon ([place]) 11/11/2017   Partial sigmoid colon resection.   . Diverticulosis   . Genetic testing 12/14/2017   Common Cancers panel (47 genes) @ [facility] - No pathogenic mutations detected  . Headache   . Heart murmur   . Hyperlipidemia   . Hypothyroidism   . Thyroid disease      Past Surgical History:  Procedure Laterality Date  . APPENDECTOMY     possible during hysterectomy  . BOWEL RESECTION N/A 11/11/2017   Procedure: SMALL BOWEL RESECTION;  Surgeon: Jules Husbands, MD;  Location: [facility] ORS;  Service: General;  Laterality: N/A;  . FLEXIBLE SIGMOIDOSCOPY N/A 11/07/2017   Procedure: Beryle Quant;  Surgeon: Jonathon Bellows, MD;   Location: [facility] ENDOSCOPY;  Service: Gastroenterology;  Laterality:  N/A;  . LAPAROSCOPIC SIGMOID COLECTOMY N/A 11/11/2017   Procedure: LAPAROSCOPIC SIGMOID COLECTOMY;  Surgeon: Jules Husbands, MD;  Location: [facility] ORS;  Service: General;  Laterality: N/A;  . MOUTH SURGERY    . PORTACATH PLACEMENT Right 11/29/2017   Procedure: INSERTION PORT-A-CATH;  Surgeon: Jules Husbands, MD;  Location: [place];  Service: General;  Laterality: Right;  May do local and IV sedation. Please have U/S  . TOTAL ABDOMINAL HYSTERECTOMY  05/20/2014   ooperectomy unilateral    Social History   Socioeconomic History  . Marital status: Married    Spouse name: Not on file  . Number of children: Not on file  . Years of education: Not on file  . Highest education level: Not on file  Occupational History  . Not on file  Social Needs  . Financial resource strain: Not on file  . Food insecurity:    Worry: Not on file    Inability: Not on file  . Transportation needs:    Medical: Not on file    Non-medical: Not on file  Tobacco Use  . Smoking status: Never Smoker  . Smokeless tobacco: Never Used  Substance and Sexual Activity  . Alcohol use: No    Alcohol/week: 0.0 oz  . Drug use: No  . Sexual activity: Yes  Lifestyle  . Physical activity:    Days per week: Not on file    Minutes per session: Not on file  . Stress: Not on file  Relationships  . Social connections:    Talks on phone: Not on file    Gets together: Not on file    Attends religious service: Not on file    Active member of club or organization: Not on file    Attends meetings of clubs or organizations: Not on file    Relationship status: Not on file  . Intimate partner violence:    Fear of current or ex partner: Not on file    Emotionally abused: Not on file    Physically abused: Not on file    Forced sexual activity: Not on file  Other Topics Concern  . Not on file  Social History Narrative  . Not on file    Family  History  Problem Relation Age of Onset  . Breast cancer Mother 15       currently 17  . Hypertension Mother   . Thyroid disease Brother   . Hernia Brother   . Thyroid cancer Maternal Grandmother 70       deceased 81  . Stomach cancer Paternal Grandfather        unconfirmed; deceased 78  . Stomach cancer Paternal Aunt 81       deceased 89  . Colon cancer Maternal Aunt 68       also lung cancer  . Stomach cancer Maternal Uncle 58     Current Outpatient Medications:  .  APPLE CIDER VINEGAR PO, Take 1 tablet by mouth daily., Disp: , Rfl:  .  Biotin 10 MG TABS, Take 2 tablets by mouth daily. , Disp: , Rfl:  .  Biotin 1000 MCG tablet, Take 1,000 mcg by mouth daily., Disp: , Rfl:  .  dexamethasone (DECADRON) 4 MG tablet, Take 2 tablets (8 mg total) by mouth daily. Start the day after chemotherapy for 2 days. Take with food., Disp: 30 tablet, Rfl: 1 .  levothyroxine (SYNTHROID, LEVOTHROID) 175 MCG tablet, Take 1 tablet (175 mcg total) by mouth daily before breakfast.,  Disp: 30 tablet, Rfl: 11 .  lidocaine-prilocaine (EMLA) cream, Apply to affected area once, Disp: 30 g, Rfl: 3 .  LORazepam (ATIVAN) 0.5 MG tablet, Take 1 tablet (0.5 mg total) by mouth every 6 (six) hours as needed (Nausea or vomiting)., Disp: 30 tablet, Rfl: 0 .  Multiple Vitamin tablet, Take 2 tablets by mouth daily. , Disp: , Rfl:  .  OMEGA-3 FATTY ACIDS PO, Take 2 capsules by mouth daily. , Disp: , Rfl:  .  ondansetron (ZOFRAN) 8 MG tablet, Take 1 tablet (8 mg total) by mouth 2 (two) times daily as needed for refractory nausea / vomiting. Start on day 3 after chemotherapy., Disp: 30 tablet, Rfl: 1 .  prochlorperazine (COMPAZINE) 10 MG tablet, Take 1 tablet (10 mg total) by mouth every 6 (six) hours as needed (Nausea or vomiting)., Disp: 30 tablet, Rfl: 1 .  VITAMIN D, CHOLECALCIFEROL, PO, Take 1 tablet by mouth daily., Disp: , Rfl:   Physical exam:  Vitals:   03/14/18 0846  BP: 126/80  Pulse: 90  Resp: 18  Temp: 97.9  F (36.6 C)  TempSrc: Tympanic  SpO2: 98%  Weight: 210 lb 1.6 oz (95.3 kg)  Height: 5' 7"  (1.702 m)   Physical Exam  Constitutional: She is oriented to person, place, and time and well-developed, well-nourished, and in no distress.  HENT:  Head: Normocephalic and atraumatic.  Eyes: Pupils are equal, round, and reactive to light. EOM are normal.  Neck: Normal range of motion.  Cardiovascular: Normal rate, regular rhythm and normal heart sounds.  Pulmonary/Chest: Effort normal and breath sounds normal.  Abdominal: Soft. Bowel sounds are normal.  Neurological: She is alert and oriented to person, place, and time.  Skin: Skin is warm and dry.     CMP Latest Ref Rng & Units 03/14/2018  Glucose 65 - 99 mg/dL 141(H)  BUN 6 - 20 mg/dL 8  Creatinine 0.44 - 1.00 mg/dL 0.62  Sodium 135 - 145 mmol/L 139  Potassium 3.5 - 5.1 mmol/L 3.6  Chloride 101 - 111 mmol/L 108  CO2 22 - 32 mmol/L 23  Calcium 8.9 - 10.3 mg/dL 9.0  Total Protein 6.5 - 8.1 g/dL 6.9  Total Bilirubin 0.3 - 1.2 mg/dL 0.8  Alkaline Phos 38 - 126 U/L 96  AST 15 - 41 U/L 32  ALT 14 - 54 U/L 29   CBC Latest Ref Rng & Units 03/14/2018  WBC 3.6 - 11.0 K/uL 5.0  Hemoglobin 12.0 - 16.0 g/dL 12.5  Hematocrit 35.0 - 47.0 % 36.0  Platelets 150 - 440 K/uL 177     Assessment and plan- Patient is a 47 y.o. 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 Ok to proceed with cycle 6 of FOLFOX chemotherapy today with neulasta support on day 3 given that she has had chemo induced neutropenia in the past.  RTC in 2 weeks- cbc/ cmp for cycle 7 of FOLFOX chemotherapy with onpro neulasta support     Visit Diagnosis 1. Cancer of sigmoid ([place])   2. Encounter for antineoplastic chemotherapy      Dr. Randa Evens,  MD, MPH [facility] at [facility] Pager- [phone] 03/13/2018 1:10 PM

## 2018-03-14 ENCOUNTER — Inpatient Hospital Stay: Payer: Managed Care, Other (non HMO)

## 2018-03-14 ENCOUNTER — Inpatient Hospital Stay (HOSPITAL_BASED_OUTPATIENT_CLINIC_OR_DEPARTMENT_OTHER): Payer: Managed Care, Other (non HMO) | Admitting: Oncology

## 2018-03-14 ENCOUNTER — Encounter: Payer: Self-pay | Admitting: Oncology

## 2018-03-14 ENCOUNTER — Encounter: Payer: Self-pay | Admitting: *Deleted

## 2018-03-14 VITALS — BP 126/80 | HR 90 | Temp 97.9°F | Resp 18 | Ht 67.0 in | Wt 210.1 lb

## 2018-03-14 DIAGNOSIS — Z5111 Encounter for antineoplastic chemotherapy: Secondary | ICD-10-CM

## 2018-03-14 DIAGNOSIS — C187 Malignant neoplasm of sigmoid colon: Secondary | ICD-10-CM | POA: Diagnosis not present

## 2018-03-14 DIAGNOSIS — D696 Thrombocytopenia, unspecified: Secondary | ICD-10-CM

## 2018-03-14 DIAGNOSIS — R945 Abnormal results of liver function studies: Secondary | ICD-10-CM | POA: Diagnosis not present

## 2018-03-14 LAB — CBC WITH DIFFERENTIAL/PLATELET
Basophils Absolute: 0 10*3/uL (ref 0–0.1)
Basophils Relative: 0 %
EOS ABS: 0.2 10*3/uL (ref 0–0.7)
EOS PCT: 4 %
HCT: 36 % (ref 35.0–47.0)
Hemoglobin: 12.5 g/dL (ref 12.0–16.0)
LYMPHS ABS: 1.6 10*3/uL (ref 1.0–3.6)
Lymphocytes Relative: 33 %
MCH: 32.8 pg (ref 26.0–34.0)
MCHC: 34.6 g/dL (ref 32.0–36.0)
MCV: 94.7 fL (ref 80.0–100.0)
MONO ABS: 0.4 10*3/uL (ref 0.2–0.9)
Monocytes Relative: 8 %
Neutro Abs: 2.8 10*3/uL (ref 1.4–6.5)
Neutrophils Relative %: 55 %
PLATELETS: 177 10*3/uL (ref 150–440)
RBC: 3.81 MIL/uL (ref 3.80–5.20)
RDW: 21.8 % — AB (ref 11.5–14.5)
WBC: 5 10*3/uL (ref 3.6–11.0)

## 2018-03-14 LAB — COMPREHENSIVE METABOLIC PANEL
ALBUMIN: 3.6 g/dL (ref 3.5–5.0)
ALT: 29 U/L (ref 14–54)
AST: 32 U/L (ref 15–41)
Alkaline Phosphatase: 96 U/L (ref 38–126)
Anion gap: 8 (ref 5–15)
BILIRUBIN TOTAL: 0.8 mg/dL (ref 0.3–1.2)
BUN: 8 mg/dL (ref 6–20)
CHLORIDE: 108 mmol/L (ref 101–111)
CO2: 23 mmol/L (ref 22–32)
CREATININE: 0.62 mg/dL (ref 0.44–1.00)
Calcium: 9 mg/dL (ref 8.9–10.3)
GFR calc Af Amer: >60 mL/min (ref >60)
GLUCOSE: 141 mg/dL — AB (ref 65–99)
Potassium: 3.6 mmol/L (ref 3.5–5.1)
Sodium: 139 mmol/L (ref 135–145)
Total Protein: 6.9 g/dL (ref 6.5–8.1)

## 2018-03-14 MED ORDER — LEUCOVORIN CALCIUM INJECTION 350 MG
850.0000 mg | Freq: Once | INTRAVENOUS | Status: AC
  Administered 2018-03-14: 850 mg via INTRAVENOUS
  Filled 2018-03-14: qty 25

## 2018-03-14 MED ORDER — DEXTROSE 5 % IV SOLN
Freq: Once | INTRAVENOUS | Status: AC
  Administered 2018-03-14: 10:00:00 via INTRAVENOUS
  Filled 2018-03-14: qty 1000

## 2018-03-14 MED ORDER — OXALIPLATIN CHEMO INJECTION 100 MG/20ML
65.0000 mg/m2 | Freq: Once | INTRAVENOUS | Status: AC
  Administered 2018-03-14: 135 mg via INTRAVENOUS
  Filled 2018-03-14: qty 7

## 2018-03-14 MED ORDER — FLUOROURACIL CHEMO INJECTION 2.5 GM/50ML
400.0000 mg/m2 | Freq: Once | INTRAVENOUS | Status: AC
  Administered 2018-03-14: 850 mg via INTRAVENOUS
  Filled 2018-03-14: qty 17

## 2018-03-14 MED ORDER — SODIUM CHLORIDE 0.9 % IV SOLN: 10.0000 mg | Freq: Once | INTRAVENOUS | Status: DC

## 2018-03-14 MED ORDER — SODIUM CHLORIDE 0.9% FLUSH
10.0000 mL | INTRAVENOUS | Status: DC | PRN
  Administered 2018-03-14: 10 mL via INTRAVENOUS
  Filled 2018-03-14: qty 10

## 2018-03-14 MED ORDER — PALONOSETRON HCL INJECTION 0.25 MG/5ML
0.2500 mg | Freq: Once | INTRAVENOUS | Status: AC
  Administered 2018-03-14: 0.25 mg via INTRAVENOUS
  Filled 2018-03-14: qty 5

## 2018-03-14 MED ORDER — SODIUM CHLORIDE 0.9 % IV SOLN
2400.0000 mg/m2 | INTRAVENOUS | Status: DC
  Administered 2018-03-14: 5000 mg via INTRAVENOUS
  Filled 2018-03-14: qty 100

## 2018-03-14 MED ORDER — HEPARIN SOD (PORK) LOCK FLUSH 100 UNIT/ML IV SOLN: 500.0000 [IU] | Freq: Once | INTRAVENOUS | Status: DC

## 2018-03-14 MED ORDER — DEXAMETHASONE SODIUM PHOSPHATE 10 MG/ML IJ SOLN
10.0000 mg | Freq: Once | INTRAMUSCULAR | Status: AC
  Administered 2018-03-14: 10 mg via INTRAVENOUS
  Filled 2018-03-14: qty 1

## 2018-03-14 MED ORDER — LIDOCAINE-PRILOCAINE 2.5-2.5 % EX CREA: TOPICAL_CREAM | CUTANEOUS | 3 refills | Status: DC

## 2018-03-14 NOTE — Progress Notes (Signed)
No new changes noted today 

## 2018-03-14 NOTE — Progress Notes (Signed)
Spoke with patient in clinic this morning regarding whether she has received the link to complete her [id number] 3 month questionnaire. Patient states she has not checked her email lately, but that she will check it this evening and go ahead and complete the questionnaire electronically if it is available. She will call me back and let me know tomorrow. My contact information was provided to her. Yolande Jolly, BSN, MHA, OCN 03/14/2018 9:00 AM

## 2018-03-16 ENCOUNTER — Inpatient Hospital Stay: Payer: Managed Care, Other (non HMO)

## 2018-03-16 VITALS — BP 142/85 | HR 86 | Temp 96.1°F | Resp 18

## 2018-03-16 DIAGNOSIS — C187 Malignant neoplasm of sigmoid colon: Secondary | ICD-10-CM

## 2018-03-16 DIAGNOSIS — Z5111 Encounter for antineoplastic chemotherapy: Secondary | ICD-10-CM | POA: Diagnosis not present

## 2018-03-16 MED ORDER — PEGFILGRASTIM INJECTION 6 MG/0.6ML ~~LOC~~
6.0000 mg | PREFILLED_SYRINGE | Freq: Once | SUBCUTANEOUS | Status: AC
  Administered 2018-03-16: 6 mg via SUBCUTANEOUS
  Filled 2018-03-16: qty 0.6

## 2018-03-16 MED ORDER — HEPARIN SOD (PORK) LOCK FLUSH 100 UNIT/ML IV SOLN
500.0000 [IU] | Freq: Once | INTRAVENOUS | Status: AC | PRN
  Administered 2018-03-16: 500 [IU]
  Filled 2018-03-16: qty 5

## 2018-03-28 ENCOUNTER — Inpatient Hospital Stay (HOSPITAL_BASED_OUTPATIENT_CLINIC_OR_DEPARTMENT_OTHER): Payer: Managed Care, Other (non HMO) | Admitting: Oncology

## 2018-03-28 ENCOUNTER — Inpatient Hospital Stay: Payer: Managed Care, Other (non HMO)

## 2018-03-28 ENCOUNTER — Inpatient Hospital Stay: Payer: Managed Care, Other (non HMO) | Attending: Oncology

## 2018-03-28 ENCOUNTER — Encounter: Payer: Self-pay | Admitting: Oncology

## 2018-03-28 VITALS — BP 153/90 | HR 81 | Temp 97.4°F | Resp 18 | Ht 67.0 in | Wt 211.8 lb

## 2018-03-28 DIAGNOSIS — K5792 Diverticulitis of intestine, part unspecified, without perforation or abscess without bleeding: Secondary | ICD-10-CM | POA: Diagnosis not present

## 2018-03-28 DIAGNOSIS — C187 Malignant neoplasm of sigmoid colon: Secondary | ICD-10-CM

## 2018-03-28 DIAGNOSIS — Z5189 Encounter for other specified aftercare: Secondary | ICD-10-CM | POA: Insufficient documentation

## 2018-03-28 DIAGNOSIS — E039 Hypothyroidism, unspecified: Secondary | ICD-10-CM | POA: Diagnosis not present

## 2018-03-28 DIAGNOSIS — Z5111 Encounter for antineoplastic chemotherapy: Secondary | ICD-10-CM | POA: Insufficient documentation

## 2018-03-28 DIAGNOSIS — Z803 Family history of malignant neoplasm of breast: Secondary | ICD-10-CM | POA: Diagnosis not present

## 2018-03-28 DIAGNOSIS — D696 Thrombocytopenia, unspecified: Secondary | ICD-10-CM | POA: Insufficient documentation

## 2018-03-28 DIAGNOSIS — R110 Nausea: Secondary | ICD-10-CM

## 2018-03-28 DIAGNOSIS — Z95828 Presence of other vascular implants and grafts: Secondary | ICD-10-CM

## 2018-03-28 LAB — CBC WITH DIFFERENTIAL/PLATELET
Basophils Absolute: 0 10*3/uL (ref 0–0.1)
Basophils Relative: 1 %
Eosinophils Absolute: 0.2 10*3/uL (ref 0–0.7)
Eosinophils Relative: 2 %
HEMATOCRIT: 35 % (ref 35.0–47.0)
HEMOGLOBIN: 12.2 g/dL (ref 12.0–16.0)
LYMPHS ABS: 2.1 10*3/uL (ref 1.0–3.6)
LYMPHS PCT: 28 %
MCH: 33.6 pg (ref 26.0–34.0)
MCHC: 34.9 g/dL (ref 32.0–36.0)
MCV: 96.3 fL (ref 80.0–100.0)
Monocytes Absolute: 0.6 10*3/uL (ref 0.2–0.9)
Monocytes Relative: 9 %
NEUTROS PCT: 60 %
Neutro Abs: 4.6 10*3/uL (ref 1.4–6.5)
Platelets: 118 10*3/uL — ABNORMAL LOW (ref 150–440)
RBC: 3.63 MIL/uL — AB (ref 3.80–5.20)
RDW: 19.5 % — ABNORMAL HIGH (ref 11.5–14.5)
WBC: 7.6 10*3/uL (ref 3.6–11.0)

## 2018-03-28 LAB — COMPREHENSIVE METABOLIC PANEL
ALT: 50 U/L (ref 14–54)
AST: 53 U/L — AB (ref 15–41)
Albumin: 3.6 g/dL (ref 3.5–5.0)
Alkaline Phosphatase: 125 U/L (ref 38–126)
Anion gap: 8 (ref 5–15)
BUN: 12 mg/dL (ref 6–20)
CHLORIDE: 108 mmol/L (ref 101–111)
CO2: 23 mmol/L (ref 22–32)
Calcium: 9.1 mg/dL (ref 8.9–10.3)
Creatinine, Ser: 0.72 mg/dL (ref 0.44–1.00)
Glucose, Bld: 115 mg/dL — ABNORMAL HIGH (ref 65–99)
POTASSIUM: 3.7 mmol/L (ref 3.5–5.1)
SODIUM: 139 mmol/L (ref 135–145)
Total Bilirubin: 0.4 mg/dL (ref 0.3–1.2)
Total Protein: 6.9 g/dL (ref 6.5–8.1)

## 2018-03-28 MED ORDER — DEXAMETHASONE SODIUM PHOSPHATE 10 MG/ML IJ SOLN
10.0000 mg | Freq: Once | INTRAMUSCULAR | Status: AC
  Administered 2018-03-28: 10 mg via INTRAVENOUS
  Filled 2018-03-28: qty 1

## 2018-03-28 MED ORDER — PALONOSETRON HCL INJECTION 0.25 MG/5ML
0.2500 mg | Freq: Once | INTRAVENOUS | Status: AC
  Administered 2018-03-28: 0.25 mg via INTRAVENOUS
  Filled 2018-03-28: qty 5

## 2018-03-28 MED ORDER — FLUOROURACIL CHEMO INJECTION 2.5 GM/50ML
400.0000 mg/m2 | Freq: Once | INTRAVENOUS | Status: AC
  Administered 2018-03-28: 850 mg via INTRAVENOUS
  Filled 2018-03-28: qty 17

## 2018-03-28 MED ORDER — HEPARIN SOD (PORK) LOCK FLUSH 100 UNIT/ML IV SOLN: 500.0000 [IU] | INTRAVENOUS | Status: AC | PRN

## 2018-03-28 MED ORDER — SODIUM CHLORIDE 0.9 % IV SOLN: 10.0000 mg | Freq: Once | INTRAVENOUS | Status: DC

## 2018-03-28 MED ORDER — DEXTROSE 5 % IV SOLN
Freq: Once | INTRAVENOUS | Status: AC
Start: 2018-03-28 — End: 2018-03-28
  Administered 2018-03-28: 10:00:00 via INTRAVENOUS
  Filled 2018-03-28: qty 1000

## 2018-03-28 MED ORDER — HEPARIN SOD (PORK) LOCK FLUSH 100 UNIT/ML IV SOLN: 500.0000 [IU] | Freq: Once | INTRAVENOUS | Status: DC | PRN

## 2018-03-28 MED ORDER — LEUCOVORIN CALCIUM INJECTION 350 MG
850.0000 mg | Freq: Once | INTRAMUSCULAR | Status: AC
  Administered 2018-03-28: 850 mg via INTRAVENOUS
  Filled 2018-03-28: qty 25

## 2018-03-28 MED ORDER — OXALIPLATIN CHEMO INJECTION 100 MG/20ML
65.0000 mg/m2 | Freq: Once | INTRAVENOUS | Status: AC
  Administered 2018-03-28: 135 mg via INTRAVENOUS
  Filled 2018-03-28: qty 20

## 2018-03-28 MED ORDER — SODIUM CHLORIDE 0.9 % IV SOLN
2400.0000 mg/m2 | INTRAVENOUS | Status: DC
  Administered 2018-03-28: 5000 mg via INTRAVENOUS
  Filled 2018-03-28: qty 100

## 2018-03-28 MED ORDER — SODIUM CHLORIDE 0.9% FLUSH
10.0000 mL | INTRAVENOUS | Status: AC | PRN
  Administered 2018-03-28: 10 mL
  Filled 2018-03-28: qty 10

## 2018-03-28 NOTE — Progress Notes (Signed)
No new changes noted today 

## 2018-03-28 NOTE — Progress Notes (Signed)
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 Vicente Males from GI for recurrent episodes of diverticulitis sinceaugust2018 which was treated with antibiotics. However patient continued to have some abdominal cramping along with some bleeding in her stool and constipation alternating with diarrhea. Patient underwent flexible sigmoidoscopy on 11/07/2017. A circumferential infiltrative completely obstructing large mass was found in the rectosigmoid colon.Biopsy showed at least high-grade dysplasia. Patient was referred to Dr. Derinda Sis consideration of hemicolectomy.  Patient underwent laparoscopic low anterior resection along with small bowel resection and laparoscopic takedown of the splenic flexure on 11/11/2017. 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 Tumor size was 3 cm, moderately differentiated. All margins were negative for invasive carcinoma. Perineural invasion and lymphovascular invasion was present. 1 out of 17 lymph nodes was positive for malignancy. Pathologic stage was pT4b pN1a. MSI stable  Preoperative CEA is not available. CT abdomen on 10/26/2017 showed residual changes of diverticulitis although improved when compared to prior exam. No perforation or abscess is identified.  No family history of colon cancer. History of breast cancer in her mother in her 71s. Genetic testing was  negative   Interval history- mild self limited nausea. She had cold sensitivity at times during swallowing. Nose bleeds have reduced and are mild and intermittent. Bowel movements are regular. Denies any tingling numbness in hands and feet  ECOG PS- 0 Pain scale- 0   Review of systems- Review of Systems  Constitutional: Negative for chills, fever, malaise/fatigue and weight loss.  HENT: Positive for nosebleeds. Negative for congestion and ear discharge.   Eyes: Negative for blurred vision.  Respiratory: Negative for cough, hemoptysis, sputum production, shortness of breath and wheezing.   Cardiovascular: Negative for chest pain, palpitations, orthopnea and claudication.  Gastrointestinal: Positive for nausea. Negative for abdominal pain, blood in stool, constipation, diarrhea, heartburn, melena and vomiting.  Genitourinary: Negative for dysuria, flank pain, frequency, hematuria and urgency.  Musculoskeletal: Negative for back pain, joint pain and myalgias.  Skin: Negative for rash.  Neurological: Negative for dizziness, tingling, focal weakness, seizures, weakness and headaches.  Endo/Heme/Allergies: Does not bruise/bleed easily.  Psychiatric/Behavioral: Negative for depression and suicidal ideas. The patient does not have insomnia.       No Known Allergies   Past Medical History:  Diagnosis Date  . Anemia   . Cancer of sigmoid colon ([place]) 11/11/2017   Partial sigmoid colon resection.   . Diverticulosis   . Genetic testing 12/14/2017   Common Cancers panel (47 genes) @ [facility] - No pathogenic mutations detected  . Headache   . Heart murmur   . Hyperlipidemia   . Hypothyroidism   . Thyroid disease      Past Surgical History:  Procedure Laterality Date  . APPENDECTOMY     possible during hysterectomy  . BOWEL RESECTION N/A 11/11/2017   Procedure: SMALL BOWEL RESECTION;  Surgeon: Jules Husbands, MD;  Location: [facility] ORS;  Service: General;  Laterality: N/A;  . FLEXIBLE  SIGMOIDOSCOPY N/A 11/07/2017   Procedure: FLEXIBLE SIGMOIDOSCOPY;  Surgeon: Jonathon Bellows, MD;  Location: [facility] ENDOSCOPY;  Service: Gastroenterology;  Laterality: N/A;  . LAPAROSCOPIC SIGMOID COLECTOMY N/A 11/11/2017   Procedure: LAPAROSCOPIC SIGMOID COLECTOMY;  Surgeon: Jules Husbands, MD;  Location: [facility] ORS;  Service: General;  Laterality: N/A;  . MOUTH SURGERY    . PORTACATH PLACEMENT Right 11/29/2017   Procedure: INSERTION PORT-A-CATH;  Surgeon: Jules Husbands, MD;  Location: [place];  Service: General;  Laterality: Right;  May do local and IV sedation. Please have U/S  . TOTAL ABDOMINAL HYSTERECTOMY  05/20/2014   ooperectomy unilateral    Social History   Socioeconomic History  . Marital status: Married    Spouse name: Not on file  . Number of children: Not on file  . Years of education: Not on file  . Highest education level: Not on file  Occupational History  . Not on file  Social Needs  . Financial resource strain: Not on file  . Food insecurity:    Worry: Not on file    Inability: Not on file  . Transportation needs:    Medical: Not on file    Non-medical: Not on file  Tobacco Use  . Smoking status: Never Smoker  . Smokeless tobacco: Never Used  Substance and Sexual Activity  . Alcohol use: No    Alcohol/week: 0.0 oz  . Drug use: No  . Sexual activity: Yes  Lifestyle  . Physical activity:    Days per week: Not on file    Minutes per session: Not on file  . Stress: Not on file  Relationships  . Social connections:    Talks on phone: Not on file    Gets together: Not on file    Attends religious service: Not on file    Active member of club or organization: Not on file    Attends meetings of clubs or organizations: Not on file    Relationship status: Not on file  . Intimate partner violence:    Fear of current or ex partner: Not on file    Emotionally abused: Not on file    Physically abused: Not on file    Forced sexual activity: Not on file    Other Topics Concern  . Not on file  Social History Narrative  . Not on file    Family History  Problem Relation Age of Onset  . Breast cancer Mother 24       currently 53  . Hypertension Mother   . Thyroid disease Brother   . Hernia Brother   . Thyroid cancer Maternal Grandmother 52       deceased 75  . Stomach cancer Paternal Grandfather        unconfirmed; deceased 78  . Stomach cancer Paternal Aunt 85       deceased 76  . Colon cancer Maternal Aunt 68       also lung cancer  . Stomach cancer Maternal Uncle 89     Current Outpatient Medications:  .  APPLE CIDER VINEGAR PO, Take 1 tablet by mouth daily., Disp: , Rfl:  .  Biotin 10 MG TABS, Take 2 tablets by mouth daily. , Disp: , Rfl:  .  Biotin 1000 MCG tablet, Take 1,000 mcg by mouth daily., Disp: , Rfl:  .  Cyanocobalamin (VITAMIN B 12 PO), Take by mouth., Disp: , Rfl:  .  dexamethasone (DECADRON) 4 MG tablet, Take 2 tablets (8 mg total) by mouth daily. Start the day after chemotherapy for 2  days. Take with food., Disp: 30 tablet, Rfl: 1 .  levothyroxine (SYNTHROID, LEVOTHROID) 175 MCG tablet, Take 1 tablet (175 mcg total) by mouth daily before breakfast., Disp: 30 tablet, Rfl: 11 .  lidocaine-prilocaine (EMLA) cream, Apply to affected area once, Disp: 30 g, Rfl: 3 .  LORazepam (ATIVAN) 0.5 MG tablet, Take 1 tablet (0.5 mg total) by mouth every 6 (six) hours as needed (Nausea or vomiting)., Disp: 30 tablet, Rfl: 0 .  Multiple Vitamin tablet, Take 2 tablets by mouth daily. , Disp: , Rfl:  .  OMEGA-3 FATTY ACIDS PO, Take 2 capsules by mouth daily. , Disp: , Rfl:  .  ondansetron (ZOFRAN) 8 MG tablet, Take 1 tablet (8 mg total) by mouth 2 (two) times daily as needed for refractory nausea / vomiting. Start on day 3 after chemotherapy., Disp: 30 tablet, Rfl: 1 .  prochlorperazine (COMPAZINE) 10 MG tablet, Take 1 tablet (10 mg total) by mouth every 6 (six) hours as needed (Nausea or vomiting)., Disp: 30 tablet, Rfl: 1 .  VITAMIN  D, CHOLECALCIFEROL, PO, Take 1 tablet by mouth daily., Disp: , Rfl:   Physical exam:  Vitals:   03/28/18 0857  BP: (!) 153/90  Pulse: 81  Resp: 18  Temp: (!) 97.4 F (36.3 C)  TempSrc: Tympanic  SpO2: 98%  Weight: 211 lb 12.8 oz (96.1 kg)  Height: 5' 7"  (1.702 m)   Physical Exam  Constitutional: She is oriented to person, place, and time. She appears well-developed and well-nourished.  HENT:  Head: Normocephalic and atraumatic.  Eyes: Pupils are equal, round, and reactive to light. EOM are normal.  Neck: Normal range of motion.  Cardiovascular: Normal rate, regular rhythm and normal heart sounds.  Pulmonary/Chest: Effort normal and breath sounds normal.  Abdominal: Soft. Bowel sounds are normal.  Surgical scar well healed  Neurological: She is alert and oriented to person, place, and time.  Skin: Skin is warm and dry.     CMP Latest Ref Rng & Units 03/28/2018  Glucose 65 - 99 mg/dL 115(H)  BUN 6 - 20 mg/dL 12  Creatinine 0.44 - 1.00 mg/dL 0.72  Sodium 135 - 145 mmol/L 139  Potassium 3.5 - 5.1 mmol/L 3.7  Chloride 101 - 111 mmol/L 108  CO2 22 - 32 mmol/L 23  Calcium 8.9 - 10.3 mg/dL 9.1  Total Protein 6.5 - 8.1 g/dL 6.9  Total Bilirubin 0.3 - 1.2 mg/dL 0.4  Alkaline Phos 38 - 126 U/L 125  AST 15 - 41 U/L 53(H)  ALT 14 - 54 U/L 50   CBC Latest Ref Rng & Units 03/28/2018  WBC 3.6 - 11.0 K/uL 7.6  Hemoglobin 12.0 - 16.0 g/dL 12.2  Hematocrit 35.0 - 47.0 % 35.0  Platelets 150 - 440 K/uL 118(L)      Assessment and plan- Patient is a 47 y.o. 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  She has mild thrombocytopenia with a platelet count of 118 which she will continue to monitor.  Her LFTs are normal except for a mildly elevated AST of 53.  She will be getting  Neulasta on day 3 given prior history of neutropenia with FOLFOX.  I will see her back in 2 weeks time with CBC and CMP tentatively for cycle #8 of FOLFOX chemotherapy    Visit Diagnosis  1. Cancer of sigmoid ([place])   2. Encounter for antineoplastic chemotherapy      Dr. Randa Evens, MD, MPH [facility] at [facility] [phone] 03/28/2018 9:44 AM

## 2018-03-30 ENCOUNTER — Inpatient Hospital Stay: Payer: Managed Care, Other (non HMO)

## 2018-03-30 VITALS — BP 133/76 | HR 80 | Temp 99.2°F | Resp 20

## 2018-03-30 DIAGNOSIS — C187 Malignant neoplasm of sigmoid colon: Secondary | ICD-10-CM

## 2018-03-30 DIAGNOSIS — Z5111 Encounter for antineoplastic chemotherapy: Secondary | ICD-10-CM | POA: Diagnosis not present

## 2018-03-30 MED ORDER — PEGFILGRASTIM INJECTION 6 MG/0.6ML ~~LOC~~
6.0000 mg | PREFILLED_SYRINGE | Freq: Once | SUBCUTANEOUS | Status: AC
  Administered 2018-03-30: 6 mg via SUBCUTANEOUS
  Filled 2018-03-30: qty 0.6

## 2018-03-30 MED ORDER — HEPARIN SOD (PORK) LOCK FLUSH 100 UNIT/ML IV SOLN
500.0000 [IU] | Freq: Once | INTRAVENOUS | Status: AC | PRN
  Administered 2018-03-30: 500 [IU]
  Filled 2018-03-30: qty 5

## 2018-03-30 MED ORDER — SODIUM CHLORIDE 0.9% FLUSH
10.0000 mL | INTRAVENOUS | Status: DC | PRN
  Administered 2018-03-30: 10 mL
  Filled 2018-03-30: qty 10

## 2018-04-11 ENCOUNTER — Inpatient Hospital Stay (HOSPITAL_BASED_OUTPATIENT_CLINIC_OR_DEPARTMENT_OTHER): Payer: Managed Care, Other (non HMO) | Admitting: Oncology

## 2018-04-11 ENCOUNTER — Inpatient Hospital Stay: Payer: Managed Care, Other (non HMO)

## 2018-04-11 ENCOUNTER — Encounter: Payer: Self-pay | Admitting: Oncology

## 2018-04-11 VITALS — BP 146/94 | HR 83 | Temp 97.8°F | Resp 18 | Ht 67.0 in | Wt 212.7 lb

## 2018-04-11 DIAGNOSIS — C187 Malignant neoplasm of sigmoid colon: Secondary | ICD-10-CM

## 2018-04-11 DIAGNOSIS — D696 Thrombocytopenia, unspecified: Secondary | ICD-10-CM

## 2018-04-11 DIAGNOSIS — Z803 Family history of malignant neoplasm of breast: Secondary | ICD-10-CM | POA: Diagnosis not present

## 2018-04-11 DIAGNOSIS — Z5111 Encounter for antineoplastic chemotherapy: Secondary | ICD-10-CM

## 2018-04-11 DIAGNOSIS — K5792 Diverticulitis of intestine, part unspecified, without perforation or abscess without bleeding: Secondary | ICD-10-CM | POA: Diagnosis not present

## 2018-04-11 DIAGNOSIS — E039 Hypothyroidism, unspecified: Secondary | ICD-10-CM

## 2018-04-11 LAB — COMPREHENSIVE METABOLIC PANEL
ALBUMIN: 3.7 g/dL (ref 3.5–5.0)
ALT: 53 U/L (ref 14–54)
AST: 55 U/L — ABNORMAL HIGH (ref 15–41)
Alkaline Phosphatase: 144 U/L — ABNORMAL HIGH (ref 38–126)
Anion gap: 9 (ref 5–15)
BUN: 9 mg/dL (ref 6–20)
CHLORIDE: 105 mmol/L (ref 101–111)
CO2: 23 mmol/L (ref 22–32)
Calcium: 9 mg/dL (ref 8.9–10.3)
Creatinine, Ser: 0.67 mg/dL (ref 0.44–1.00)
GFR calc Af Amer: >60 mL/min (ref >60)
GFR calc non Af Amer: >60 mL/min (ref >60)
GLUCOSE: 97 mg/dL (ref 65–99)
POTASSIUM: 3.7 mmol/L (ref 3.5–5.1)
Sodium: 137 mmol/L (ref 135–145)
Total Bilirubin: 0.5 mg/dL (ref 0.3–1.2)
Total Protein: 6.7 g/dL (ref 6.5–8.1)

## 2018-04-11 LAB — CBC WITH DIFFERENTIAL/PLATELET
Basophils Absolute: 0.1 10*3/uL (ref 0–0.1)
Basophils Relative: 1 %
EOS PCT: 2 %
Eosinophils Absolute: 0.2 10*3/uL (ref 0–0.7)
HCT: 35.1 % (ref 35.0–47.0)
Hemoglobin: 12.5 g/dL (ref 12.0–16.0)
LYMPHS ABS: 2.8 10*3/uL (ref 1.0–3.6)
LYMPHS PCT: 25 %
MCH: 35.2 pg — AB (ref 26.0–34.0)
MCHC: 35.5 g/dL (ref 32.0–36.0)
MCV: 99.2 fL (ref 80.0–100.0)
MONO ABS: 0.9 10*3/uL (ref 0.2–0.9)
MONOS PCT: 8 %
Neutro Abs: 7.3 10*3/uL — ABNORMAL HIGH (ref 1.4–6.5)
Neutrophils Relative %: 64 %
PLATELETS: 130 10*3/uL — AB (ref 150–440)
RBC: 3.53 MIL/uL — ABNORMAL LOW (ref 3.80–5.20)
RDW: 17.6 % — AB (ref 11.5–14.5)
WBC: 11.2 10*3/uL — ABNORMAL HIGH (ref 3.6–11.0)

## 2018-04-11 MED ORDER — HEPARIN SOD (PORK) LOCK FLUSH 100 UNIT/ML IV SOLN
500.0000 [IU] | Freq: Once | INTRAVENOUS | Status: DC | PRN
  Filled 2018-04-11: qty 5

## 2018-04-11 MED ORDER — PALONOSETRON HCL INJECTION 0.25 MG/5ML
0.2500 mg | Freq: Once | INTRAVENOUS | Status: AC
  Administered 2018-04-11: 0.25 mg via INTRAVENOUS
  Filled 2018-04-11: qty 5

## 2018-04-11 MED ORDER — DEXAMETHASONE 4 MG PO TABS: 8.0000 mg | ORAL_TABLET | Freq: Every day | ORAL | 1 refills | Status: DC

## 2018-04-11 MED ORDER — DEXTROSE 5 % IV SOLN
65.0000 mg/m2 | Freq: Once | INTRAVENOUS | Status: AC
  Administered 2018-04-11: 135 mg via INTRAVENOUS
  Filled 2018-04-11: qty 20

## 2018-04-11 MED ORDER — DEXAMETHASONE SODIUM PHOSPHATE 10 MG/ML IJ SOLN
10.0000 mg | Freq: Once | INTRAMUSCULAR | Status: AC
  Administered 2018-04-11: 10 mg via INTRAVENOUS
  Filled 2018-04-11: qty 1

## 2018-04-11 MED ORDER — SODIUM CHLORIDE 0.9% FLUSH
10.0000 mL | INTRAVENOUS | Status: DC | PRN
  Administered 2018-04-11: 10 mL via INTRAVENOUS
  Filled 2018-04-11: qty 10

## 2018-04-11 MED ORDER — HEPARIN SOD (PORK) LOCK FLUSH 100 UNIT/ML IV SOLN: 500.0000 [IU] | Freq: Once | INTRAVENOUS | Status: DC

## 2018-04-11 MED ORDER — FLUOROURACIL CHEMO INJECTION 2.5 GM/50ML
400.0000 mg/m2 | Freq: Once | INTRAVENOUS | Status: AC
  Administered 2018-04-11: 850 mg via INTRAVENOUS
  Filled 2018-04-11: qty 17

## 2018-04-11 MED ORDER — DEXTROSE 5 % IV SOLN
Freq: Once | INTRAVENOUS | Status: AC
  Administered 2018-04-11: 11:00:00 via INTRAVENOUS
  Filled 2018-04-11: qty 1000

## 2018-04-11 MED ORDER — SODIUM CHLORIDE 0.9 % IV SOLN
2400.0000 mg/m2 | INTRAVENOUS | Status: DC
  Administered 2018-04-11: 5000 mg via INTRAVENOUS
  Filled 2018-04-11: qty 100

## 2018-04-11 MED ORDER — LEUCOVORIN CALCIUM INJECTION 350 MG
850.0000 mg | Freq: Once | INTRAMUSCULAR | Status: AC
  Administered 2018-04-11: 850 mg via INTRAVENOUS
  Filled 2018-04-11: qty 25

## 2018-04-11 NOTE — Progress Notes (Signed)
No new changes noted today 

## 2018-04-11 NOTE — Progress Notes (Signed)
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 Vicente Males from GI for recurrent episodes of diverticulitis sinceaugust2018 which was treated with antibiotics. However patient continued to have some abdominal cramping along with some bleeding in her stool and constipation alternating with diarrhea. Patient underwent flexible sigmoidoscopy on 11/07/2017. A circumferential infiltrative completely obstructing large mass was found in the rectosigmoid colon.Biopsy showed at least high-grade dysplasia. Patient was referred to Dr. Derinda Sis consideration of hemicolectomy.  Patient underwent laparoscopic low anterior resection along with small bowel resection and laparoscopic takedown of the splenic flexure on 11/11/2017. 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 Tumor size was 3 cm, moderately differentiated. All margins were negative for invasive carcinoma. Perineural invasion and lymphovascular invasion was present. 1 out of 17 lymph nodes was positive for malignancy. Pathologic stage was pT4b pN1a. MSI stable  Preoperative CEA is not available. CT abdomen on 10/26/2017 showed residual changes of diverticulitis although improved when compared to prior exam. No perforation or abscess is identified.  No family history of colon cancer. History of breast cancer in her mother in her 40s. Genetic testing was  negative   Interval history- she is doing well. Denies any nausea vomiting. Bowel movements have been regular. Occasional self limited nose bleeds and cold sensitivity in her hands  ECOG PS- 0 Pain scale- 0   Review of systems- Review of Systems  Constitutional: Negative for chills, fever, malaise/fatigue and weight loss.  HENT: Positive for nosebleeds. Negative for congestion and ear discharge.   Eyes: Negative for blurred vision.  Respiratory: Negative for cough, hemoptysis, sputum production, shortness of breath and wheezing.   Cardiovascular: Negative for chest pain, palpitations, orthopnea and claudication.  Gastrointestinal: Negative for abdominal pain, blood in stool, constipation, diarrhea, heartburn, melena, nausea and vomiting.  Genitourinary: Negative for dysuria, flank pain, frequency, hematuria and urgency.  Musculoskeletal: Negative for back pain, joint pain and myalgias.  Skin: Negative for rash.  Neurological: Negative for dizziness, tingling, focal weakness, seizures, weakness and headaches.  Endo/Heme/Allergies: Does not bruise/bleed easily.  Psychiatric/Behavioral: Negative for depression and suicidal ideas. The patient does not have insomnia.       No Known Allergies   Past Medical History:  Diagnosis Date  . Anemia   . Cancer of sigmoid colon ([place]) 11/11/2017   Partial sigmoid colon resection.   . Diverticulosis   . Genetic testing 12/14/2017   Common Cancers panel (47 genes) @ [facility] - No pathogenic mutations detected  . Headache   . Heart murmur   . Hyperlipidemia   . Hypothyroidism   . Thyroid disease      Past Surgical History:  Procedure Laterality Date  . APPENDECTOMY     possible during hysterectomy  . BOWEL RESECTION N/A 11/11/2017   Procedure: SMALL BOWEL RESECTION;  Surgeon: Jules Husbands, MD;  Location: [facility] ORS;  Service: General;  Laterality: N/A;  . FLEXIBLE SIGMOIDOSCOPY N/A 11/07/2017   Procedure: Beryle Quant;   Surgeon: Jonathon Bellows, MD;  Location: [facility] ENDOSCOPY;  Service:  Gastroenterology;  Laterality: N/A;  . LAPAROSCOPIC SIGMOID COLECTOMY N/A 11/11/2017   Procedure: LAPAROSCOPIC SIGMOID COLECTOMY;  Surgeon: Jules Husbands, MD;  Location: [facility] ORS;  Service: General;  Laterality: N/A;  . MOUTH SURGERY    . PORTACATH PLACEMENT Right 11/29/2017   Procedure: INSERTION PORT-A-CATH;  Surgeon: Jules Husbands, MD;  Location: [place];  Service: General;  Laterality: Right;  May do local and IV sedation. Please have U/S  . TOTAL ABDOMINAL HYSTERECTOMY  05/20/2014   ooperectomy unilateral    Social History   Socioeconomic History  . Marital status: Married    Spouse name: Not on file  . Number of children: Not on file  . Years of education: Not on file  . Highest education level: Not on file  Occupational History  . Not on file  Social Needs  . Financial resource strain: Not on file  . Food insecurity:    Worry: Not on file    Inability: Not on file  . Transportation needs:    Medical: Not on file    Non-medical: Not on file  Tobacco Use  . Smoking status: Never Smoker  . Smokeless tobacco: Never Used  Substance and Sexual Activity  . Alcohol use: No    Alcohol/week: 0.0 oz  . Drug use: No  . Sexual activity: Yes  Lifestyle  . Physical activity:    Days per week: Not on file    Minutes per session: Not on file  . Stress: Not on file  Relationships  . Social connections:    Talks on phone: Not on file    Gets together: Not on file    Attends religious service: Not on file    Active member of club or organization: Not on file    Attends meetings of clubs or organizations: Not on file    Relationship status: Not on file  . Intimate partner violence:    Fear of current or ex partner: Not on file    Emotionally abused: Not on file    Physically abused: Not on file    Forced sexual activity: Not on file  Other Topics Concern  . Not on file  Social History Narrative  .  Not on file    Family History  Problem Relation Age of Onset  . Breast cancer Mother 42       currently 45  . Hypertension Mother   . Thyroid disease Brother   . Hernia Brother   . Thyroid cancer Maternal Grandmother 93       deceased 41  . Stomach cancer Paternal Grandfather        unconfirmed; deceased 78  . Stomach cancer Paternal Aunt 40       deceased 52  . Colon cancer Maternal Aunt 68       also lung cancer  . Stomach cancer Maternal Uncle 4     Current Outpatient Medications:  .  APPLE CIDER VINEGAR PO, Take 1 tablet by mouth daily., Disp: , Rfl:  .  Biotin 10 MG TABS, Take 2 tablets by mouth daily. , Disp: , Rfl:  .  Biotin 1000 MCG tablet, Take 1,000 mcg by mouth daily., Disp: , Rfl:  .  Cyanocobalamin (VITAMIN B 12 PO), Take by mouth., Disp: , Rfl:  .  dexamethasone (DECADRON) 4 MG tablet, Take 2 tablets (8 mg total) by mouth daily. Start the day after chemotherapy for 2 days. Take with food., Disp: 30 tablet, Rfl: 1 .  levothyroxine (SYNTHROID, LEVOTHROID) 175 MCG tablet, Take 1 tablet (175 mcg total) by mouth daily before breakfast., Disp: 30 tablet, Rfl: 11 .  lidocaine-prilocaine (EMLA) cream, Apply to affected area once, Disp: 30 g, Rfl: 3 .  Multiple Vitamin tablet, Take 2 tablets by mouth daily. , Disp: , Rfl:  .  OMEGA-3 FATTY ACIDS PO, Take 2 capsules by mouth daily. , Disp: , Rfl:  .  VITAMIN D, CHOLECALCIFEROL, PO, Take 1 tablet by mouth daily., Disp: , Rfl:  .  LORazepam (ATIVAN) 0.5 MG tablet, Take 1 tablet (0.5 mg total) by mouth every 6 (six) hours as needed (Nausea or vomiting). (Patient not taking: Reported on 03/28/2018), Disp: 30 tablet, Rfl: 0 .  ondansetron (ZOFRAN) 8 MG tablet, Take 1 tablet (8 mg total) by mouth 2 (two) times daily as needed for refractory nausea / vomiting. Start on day 3 after chemotherapy. (Patient not taking: Reported on 03/28/2018), Disp: 30 tablet, Rfl: 1 .  prochlorperazine (COMPAZINE) 10 MG tablet, Take 1 tablet (10 mg total)  by mouth every 6 (six) hours as needed (Nausea or vomiting). (Patient not taking: Reported on 03/28/2018), Disp: 30 tablet, Rfl: 1 No current facility-administered medications for this visit.   Facility-Administered Medications Ordered in Other Visits:  .  fluorouracil (ADRUCIL) 5,000 mg in sodium chloride 0.9 % 150 mL chemo infusion, 2,400 mg/m2 (Treatment Plan Recorded), Intravenous, 1 day or 1 dose, Sindy Guadeloupe, MD .  fluorouracil (ADRUCIL) chemo injection 850 mg, 400 mg/m2 (Treatment Plan Recorded), Intravenous, Once, Sindy Guadeloupe, MD .  heparin lock flush 100 unit/mL, 500 Units, Intracatheter, PRN, Sindy Guadeloupe, MD .  heparin lock flush 100 unit/mL, 500 Units, Intravenous, Once, Randa Evens C, MD .  heparin lock flush 100 unit/mL, 500 Units, Intracatheter, Once PRN, Sindy Guadeloupe, MD .  leucovorin 850 mg in dextrose 5 % 250 mL infusion, 850 mg, Intravenous, Once, Sindy Guadeloupe, MD, Last Rate: 146 mL/hr at 04/11/18 1053, 850 mg at 04/11/18 1053 .  oxaliplatin (ELOXATIN) 135 mg in dextrose 5 % 500 mL chemo infusion, 65 mg/m2 (Treatment Plan Recorded), Intravenous, Once, Sindy Guadeloupe, MD, Last Rate: 264 mL/hr at 04/11/18 1053, 135 mg at 04/11/18 1053 .  sodium chloride flush (NS) 0.9 % injection 10 mL, 10 mL, Intravenous, PRN, Sindy Guadeloupe, MD, 10 mL at 04/11/18 0920  Physical exam:  Vitals:   04/11/18 0946  BP: (!) 146/94  Pulse: 83  Resp: 18  Temp: 97.8 F (36.6 C)  TempSrc: Tympanic  SpO2: 98%  Weight: 212 lb 11.2 oz (96.5 kg)  Height: 5' 7"  (1.702 m)   Physical Exam  Constitutional: She is oriented to person, place, and time. She appears well-developed and well-nourished.  HENT:  Head: Normocephalic and atraumatic.  Eyes: Pupils are equal, round, and reactive to light. EOM are normal.  Neck: Normal range of motion.  Cardiovascular: Normal rate, regular rhythm and normal heart sounds.  Pulmonary/Chest: Effort normal and breath sounds normal.  Abdominal: Soft. Bowel  sounds are normal.  Neurological: She is alert and oriented to person, place, and time.  Skin: Skin is warm and dry.     CMP Latest Ref Rng & Units 04/11/2018  Glucose 65 - 99 mg/dL 97  BUN 6 - 20 mg/dL 9  Creatinine 0.44 - 1.00 mg/dL 0.67  Sodium 135 - 145 mmol/L 137  Potassium 3.5 - 5.1 mmol/L 3.7  Chloride 101 - 111 mmol/L 105  CO2 22 - 32 mmol/L 23  Calcium 8.9 - 10.3 mg/dL 9.0  Total Protein 6.5 - 8.1 g/dL 6.7  Total Bilirubin 0.3 - 1.2 mg/dL 0.5  Alkaline Phos 38 - 126 U/L 144(H)  AST 15 - 41 U/L 55(H)  ALT 14 - 54 U/L 53   CBC Latest Ref Rng & Units 04/11/2018  WBC 3.6 - 11.0 K/uL 11.2(H)  Hemoglobin 12.0 - 16.0 g/dL 12.5  Hematocrit 35.0 - 47.0 % 35.1  Platelets 150 - 440 K/uL 130(L)    Assessment and plan- Patient is a 47 y.o. 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 Mildly abnormal AST at 55 and mild thrombocytopenia due to chemotherapy which we will continue to monitor  I will see her back in 2 weeks for cycle 9 of chemotherapy with cbc/ cmp  Scans after cycle 12    Visit Diagnosis 1. Encounter for antineoplastic chemotherapy   2. Cancer of sigmoid [facility])      Dr. Randa Evens, MD, MPH [facility] at [facility] [phone] 04/11/2018 12:30 PM

## 2018-04-13 ENCOUNTER — Inpatient Hospital Stay: Payer: Managed Care, Other (non HMO)

## 2018-04-13 VITALS — BP 142/90 | HR 80 | Temp 97.3°F | Resp 18

## 2018-04-13 DIAGNOSIS — C187 Malignant neoplasm of sigmoid colon: Secondary | ICD-10-CM

## 2018-04-13 DIAGNOSIS — Z5111 Encounter for antineoplastic chemotherapy: Secondary | ICD-10-CM | POA: Diagnosis not present

## 2018-04-13 MED ORDER — PEGFILGRASTIM INJECTION 6 MG/0.6ML ~~LOC~~
6.0000 mg | PREFILLED_SYRINGE | Freq: Once | SUBCUTANEOUS | Status: AC
  Administered 2018-04-13: 6 mg via SUBCUTANEOUS
  Filled 2018-04-13: qty 0.6

## 2018-04-13 MED ORDER — HEPARIN SOD (PORK) LOCK FLUSH 100 UNIT/ML IV SOLN
500.0000 [IU] | Freq: Once | INTRAVENOUS | Status: AC | PRN
  Administered 2018-04-13: 500 [IU]

## 2018-04-13 MED ORDER — SODIUM CHLORIDE 0.9% FLUSH
10.0000 mL | INTRAVENOUS | Status: DC | PRN
  Administered 2018-04-13: 10 mL
  Filled 2018-04-13: qty 10

## 2018-04-19 ENCOUNTER — Ambulatory Visit: Payer: Managed Care, Other (non HMO) | Admitting: Nurse Practitioner

## 2018-04-19 ENCOUNTER — Other Ambulatory Visit: Payer: Self-pay

## 2018-04-19 ENCOUNTER — Encounter: Payer: Self-pay | Admitting: Nurse Practitioner

## 2018-04-19 VITALS — BP 145/86 | HR 92 | Temp 98.4°F | Ht 67.0 in | Wt 208.4 lb

## 2018-04-19 DIAGNOSIS — E034 Atrophy of thyroid (acquired): Secondary | ICD-10-CM | POA: Diagnosis not present

## 2018-04-19 DIAGNOSIS — E039 Hypothyroidism, unspecified: Secondary | ICD-10-CM

## 2018-04-19 MED ORDER — LEVOTHYROXINE SODIUM 175 MCG PO TABS: 175.0000 ug | ORAL_TABLET | Freq: Every day | ORAL | 5 refills | Status: DC

## 2018-04-19 NOTE — Assessment & Plan Note (Signed)
Last check > 6 months ago.  Pt feeling fatigued, hair/nail changes but is also undergoing chemo.    Plan: 1. Dosing may need to be titrated, but will need to check TSH.  Pt has previously been on 200/175 combination. 2. Recheck 6 months.

## 2018-04-19 NOTE — Progress Notes (Signed)
Subjective:    Patient ID: Regina Patel, female    DOB: [birth year], 47 y.o.   MRN: [id number]  Regina Patel is a 47 y.o. female presenting on 04/19/2018 for Hypothyroidism (need refills on thyroid )   HPI Hypothyroidism - Pt states she is taking her levothyroxine 175 mcg in the am at least 1 hour before eating or drinking and taking other medicines.   - She is symptomatic with symptoms that are complicated by chemo.  Hair loss, fatigue, neuropathies, difficulty swallowing. - She denies excess energy, weight changes, heart racing, heart palpitations, heat and cold intolerance, and lower leg swelling.  - She does not have any compressive symptoms to include globus sensation, or difficulty breathing when lying flat.   Oncology update:  Pt is continuing treatment for colon cancer and is in cycle 8 of FOLFOX chemotherapy.  During therapy, she has had intermittently elevated LFTs, which are being managed by dose changes of chemo.  Plan is to proceed through 12 cycles and repeat with surveillance scans at that time. - Most significant side effects for patient are fatigue, neuropathy.  These are not preventing her from continuing her job with a 40-hr work week.  Social History   Tobacco Use  . Smoking status: Never Smoker  . Smokeless tobacco: Never Used  Substance Use Topics  . Alcohol use: No    Alcohol/week: 0.0 oz  . Drug use: No    Review of Systems Per HPI unless specifically indicated above     Objective:    BP (!) 145/86 (BP Location: Right Arm, Patient Position: Sitting, Cuff Size: Large)   Pulse 92   Temp 98.4 F (36.9 C) (Oral)   Ht 5\' 7"  (1.702 m)   Wt 208 lb 6.4 oz (94.5 kg)   BMI 32.64 kg/m   Wt Readings from Last 3 Encounters:  04/19/18 208 lb 6.4 oz (94.5 kg)  04/11/18 212 lb 11.2 oz (96.5 kg)  03/28/18 211 lb 12.8 oz (96.1 kg)    Physical Exam  Constitutional: She is oriented to person, place, and time. She appears well-developed and well-nourished. No distress.    HENT:  Head: Normocephalic and atraumatic.  Neck: No thyromegaly present.  Cardiovascular: Normal rate, regular rhythm, S1 normal, S2 normal, normal heart sounds and intact distal pulses.  Pulmonary/Chest: Effort normal and breath sounds normal. No respiratory distress.  Neurological: She is alert and oriented to person, place, and time.  Skin: Skin is warm and dry.  Psychiatric: She has a normal mood and affect. Her behavior is normal.  Vitals reviewed.   Results for orders placed or performed in visit on 04/11/18  Comprehensive metabolic panel  Result Value Ref Range   Sodium 137 135 - 145 mmol/L   Potassium 3.7 3.5 - 5.1 mmol/L   Chloride 105 101 - 111 mmol/L   CO2 23 22 - 32 mmol/L   Glucose, Bld 97 65 - 99 mg/dL   BUN 9 6 - 20 mg/dL   Creatinine, Ser 0.67 0.44 - 1.00 mg/dL   Calcium 9.0 8.9 - 10.3 mg/dL   Total Protein 6.7 6.5 - 8.1 g/dL   Albumin 3.7 3.5 - 5.0 g/dL   AST 55 (H) 15 - 41 U/L   ALT 53 14 - 54 U/L   Alkaline Phosphatase 144 (H) 38 - 126 U/L   Total Bilirubin 0.5 0.3 - 1.2 mg/dL   GFR calc non Af Amer >60 >60 mL/min   GFR calc Af Amer >60 >60 mL/min  Anion gap 9 5 - 15  CBC with Differential  Result Value Ref Range   WBC 11.2 (H) 3.6 - 11.0 K/uL   RBC 3.53 (L) 3.80 - 5.20 MIL/uL   Hemoglobin 12.5 12.0 - 16.0 g/dL   HCT 35.1 35.0 - 47.0 %   MCV 99.2 80.0 - 100.0 fL   MCH 35.2 (H) 26.0 - 34.0 pg   MCHC 35.5 32.0 - 36.0 g/dL   RDW 17.6 (H) 11.5 - 14.5 %   Platelets 130 (L) 150 - 440 K/uL   Neutrophils Relative % 64 %   Neutro Abs 7.3 (H) 1.4 - 6.5 K/uL   Lymphocytes Relative 25 %   Lymphs Abs 2.8 1.0 - 3.6 K/uL   Monocytes Relative 8 %   Monocytes Absolute 0.9 0.2 - 0.9 K/uL   Eosinophils Relative 2 %   Eosinophils Absolute 0.2 0 - 0.7 K/uL   Basophils Relative 1 %   Basophils Absolute 0.1 0 - 0.1 K/uL      Assessment & Plan:   Problem List Items Addressed This Visit      Endocrine   Hypothyroidism - Primary    Last check > 6 months ago.   Pt feeling fatigued, hair/nail changes but is also undergoing chemo.    Plan: 1. Dosing may need to be titrated, but will need to check TSH.  Pt has previously been on 200/175 combination. 2. Recheck 6 months.      Relevant Medications   levothyroxine (SYNTHROID, LEVOTHROID) 175 MCG tablet   Other Relevant Orders   TSH   T4, free      Meds ordered this encounter  Medications  . levothyroxine (SYNTHROID, LEVOTHROID) 175 MCG tablet    Sig: Take 1 tablet (175 mcg total) by mouth daily before breakfast.    Dispense:  30 tablet    Refill:  5    Order Specific Question:   Supervising Provider    Answer:   Olin Hauser [[id number]]   Follow up plan: Return in about 6 months (around 10/20/2018) for Thyroid AND annual physical same visit.  Cassell Smiles, DNP, AGPCNP-BC Adult Gerontology Primary Care Nurse Practitioner [facility] 04/19/2018, 12:08 PM

## 2018-04-19 NOTE — Patient Instructions (Addendum)
Regina Patel,   Thank you for coming in to clinic today.  1. Continue levothyroxine 175 mcg daily.  - recheck labs with chemo labs in about 2 weeks.  Please schedule a follow-up appointment with Cassell Smiles, AGNP. Return in about 6 months (around 10/20/2018) for Thyroid AND annual physical same visit.   If you have any other questions or concerns, please feel free to call the clinic or send a message through [place]. You may also schedule an earlier appointment if necessary.  You will receive a survey after today's visit either digitally by e-mail or paper by [employer]. Your experiences and feedback matter to [place].  Please respond so we know how we are doing as we provide care for you.   Cassell Smiles, DNP, AGNP-BC Adult Gerontology Nurse Practitioner [place]

## 2018-04-25 ENCOUNTER — Inpatient Hospital Stay: Payer: Managed Care, Other (non HMO) | Attending: Oncology | Admitting: Oncology

## 2018-04-25 ENCOUNTER — Inpatient Hospital Stay: Payer: Managed Care, Other (non HMO)

## 2018-04-25 ENCOUNTER — Encounter: Payer: Self-pay | Admitting: Oncology

## 2018-04-25 VITALS — BP 167/100 | HR 85 | Temp 98.8°F | Resp 18 | Ht 67.0 in | Wt 203.5 lb

## 2018-04-25 DIAGNOSIS — D649 Anemia, unspecified: Secondary | ICD-10-CM | POA: Diagnosis not present

## 2018-04-25 DIAGNOSIS — T451X5A Adverse effect of antineoplastic and immunosuppressive drugs, initial encounter: Secondary | ICD-10-CM

## 2018-04-25 DIAGNOSIS — E039 Hypothyroidism, unspecified: Secondary | ICD-10-CM | POA: Diagnosis not present

## 2018-04-25 DIAGNOSIS — D500 Iron deficiency anemia secondary to blood loss (chronic): Secondary | ICD-10-CM | POA: Insufficient documentation

## 2018-04-25 DIAGNOSIS — R7989 Other specified abnormal findings of blood chemistry: Secondary | ICD-10-CM

## 2018-04-25 DIAGNOSIS — C187 Malignant neoplasm of sigmoid colon: Secondary | ICD-10-CM | POA: Diagnosis not present

## 2018-04-25 DIAGNOSIS — D701 Agranulocytosis secondary to cancer chemotherapy: Secondary | ICD-10-CM | POA: Diagnosis not present

## 2018-04-25 DIAGNOSIS — G620 Drug-induced polyneuropathy: Secondary | ICD-10-CM | POA: Insufficient documentation

## 2018-04-25 DIAGNOSIS — R945 Abnormal results of liver function studies: Secondary | ICD-10-CM | POA: Diagnosis not present

## 2018-04-25 DIAGNOSIS — R1310 Dysphagia, unspecified: Secondary | ICD-10-CM | POA: Diagnosis not present

## 2018-04-25 DIAGNOSIS — Z5111 Encounter for antineoplastic chemotherapy: Secondary | ICD-10-CM | POA: Insufficient documentation

## 2018-04-25 DIAGNOSIS — D6959 Other secondary thrombocytopenia: Secondary | ICD-10-CM | POA: Diagnosis not present

## 2018-04-25 LAB — COMPREHENSIVE METABOLIC PANEL
ALBUMIN: 3.7 g/dL (ref 3.5–5.0)
ALK PHOS: 152 U/L — AB (ref 38–126)
ALT: 66 U/L — ABNORMAL HIGH (ref 14–54)
ANION GAP: 8 (ref 5–15)
AST: 44 U/L — ABNORMAL HIGH (ref 15–41)
BILIRUBIN TOTAL: 0.5 mg/dL (ref 0.3–1.2)
BUN: 6 mg/dL (ref 6–20)
CALCIUM: 9 mg/dL (ref 8.9–10.3)
CO2: 24 mmol/L (ref 22–32)
Chloride: 106 mmol/L (ref 101–111)
Creatinine, Ser: 0.6 mg/dL (ref 0.44–1.00)
GFR calc Af Amer: >60 mL/min (ref >60)
GFR calc non Af Amer: >60 mL/min (ref >60)
GLUCOSE: 118 mg/dL — AB (ref 65–99)
Potassium: 3.5 mmol/L (ref 3.5–5.1)
Sodium: 138 mmol/L (ref 135–145)
TOTAL PROTEIN: 6.8 g/dL (ref 6.5–8.1)

## 2018-04-25 LAB — CBC WITH DIFFERENTIAL/PLATELET
BASOS ABS: 0.1 10*3/uL (ref 0–0.1)
BASOS PCT: 1 %
Eosinophils Absolute: 0.1 10*3/uL (ref 0–0.7)
Eosinophils Relative: 1 %
HCT: 33.6 % — ABNORMAL LOW (ref 35.0–47.0)
Hemoglobin: 11.7 g/dL — ABNORMAL LOW (ref 12.0–16.0)
Lymphocytes Relative: 21 %
Lymphs Abs: 2.3 10*3/uL (ref 1.0–3.6)
MCH: 35.4 pg — ABNORMAL HIGH (ref 26.0–34.0)
MCHC: 34.9 g/dL (ref 32.0–36.0)
MCV: 101.5 fL — ABNORMAL HIGH (ref 80.0–100.0)
MONO ABS: 1 10*3/uL — AB (ref 0.2–0.9)
Monocytes Relative: 9 %
NEUTROS ABS: 7.6 10*3/uL — AB (ref 1.4–6.5)
Neutrophils Relative %: 68 %
PLATELETS: 97 10*3/uL — AB (ref 150–440)
RBC: 3.31 MIL/uL — ABNORMAL LOW (ref 3.80–5.20)
RDW: 16.8 % — AB (ref 11.5–14.5)
WBC: 11.1 10*3/uL — ABNORMAL HIGH (ref 3.6–11.0)

## 2018-04-25 MED ORDER — SODIUM CHLORIDE 0.9% FLUSH
10.0000 mL | Freq: Once | INTRAVENOUS | Status: AC
  Administered 2018-04-25: 10 mL via INTRAVENOUS
  Filled 2018-04-25: qty 10

## 2018-04-25 MED ORDER — SODIUM CHLORIDE 0.9 % IV SOLN
2400.0000 mg/m2 | INTRAVENOUS | Status: DC
  Administered 2018-04-25: 5000 mg via INTRAVENOUS
  Filled 2018-04-25: qty 100

## 2018-04-25 MED ORDER — OXALIPLATIN CHEMO INJECTION 100 MG/20ML
65.0000 mg/m2 | Freq: Once | INTRAVENOUS | Status: AC
  Administered 2018-04-25: 135 mg via INTRAVENOUS
  Filled 2018-04-25: qty 20

## 2018-04-25 MED ORDER — PALONOSETRON HCL INJECTION 0.25 MG/5ML
0.2500 mg | Freq: Once | INTRAVENOUS | Status: AC
  Administered 2018-04-25: 0.25 mg via INTRAVENOUS
  Filled 2018-04-25: qty 5

## 2018-04-25 MED ORDER — LEUCOVORIN CALCIUM INJECTION 350 MG
850.0000 mg | Freq: Once | INTRAMUSCULAR | Status: AC
  Administered 2018-04-25: 850 mg via INTRAVENOUS
  Filled 2018-04-25: qty 25

## 2018-04-25 MED ORDER — DEXTROSE 5 % IV SOLN
Freq: Once | INTRAVENOUS | Status: AC
  Administered 2018-04-25: 11:00:00 via INTRAVENOUS
  Filled 2018-04-25: qty 1000

## 2018-04-25 MED ORDER — DEXAMETHASONE SODIUM PHOSPHATE 10 MG/ML IJ SOLN
10.0000 mg | Freq: Once | INTRAMUSCULAR | Status: AC
  Administered 2018-04-25: 10 mg via INTRAVENOUS
  Filled 2018-04-25: qty 1

## 2018-04-25 MED ORDER — FLUOROURACIL CHEMO INJECTION 2.5 GM/50ML
400.0000 mg/m2 | Freq: Once | INTRAVENOUS | Status: AC
  Administered 2018-04-25: 850 mg via INTRAVENOUS
  Filled 2018-04-25: qty 17

## 2018-04-25 MED ORDER — HEPARIN SOD (PORK) LOCK FLUSH 100 UNIT/ML IV SOLN: 500.0000 [IU] | Freq: Once | INTRAVENOUS | Status: DC

## 2018-04-25 NOTE — Progress Notes (Signed)
Patient c/o of face swelling and lips and tongue are bright red with swelling after 3-4 days after chemo / fingers and feet are feeling more tingling.  Bloating with (pain level 10) and 1 week after chemo  Also nose bleed x after chemo occ. 2-3 days. Through out the day/ mild nose bleed

## 2018-04-25 NOTE — Progress Notes (Signed)
Plt =97.  MD ok to treat today

## 2018-04-25 NOTE — Progress Notes (Signed)
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 Vicente Males from GI for recurrent episodes of diverticulitis sinceaugust2018 which was treated with antibiotics. However patient continued to have some abdominal cramping along with some bleeding in her stool and constipation alternating with diarrhea. Patient underwent flexible sigmoidoscopy on 11/07/2017. A circumferential infiltrative completely obstructing large mass was found in the rectosigmoid colon.Biopsy showed at least high-grade dysplasia. Patient was referred to Dr. Derinda Sis consideration of hemicolectomy.  Patient underwent laparoscopic low anterior resection along with small bowel resection and laparoscopic takedown of the splenic flexure on 11/11/2017. 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 Tumor size was 3 cm, moderately differentiated. All margins were negative for invasive carcinoma. Perineural invasion and lymphovascular invasion was present. 1 out of 17 lymph nodes was positive for malignancy. Pathologic stage was pT4b pN1a. MSI stable  Preoperative CEA is not available. CT abdomen on 10/26/2017 showed residual changes of diverticulitis although improved when compared to prior exam. No perforation or abscess is identified.  No family history of colon cancer. History of breast cancer in her mother in her 89s. Genetic testing was  negative    Interval history- reports that tingling sensation is mainly in her feet and it is mild but constant lingering feeling. Denies any pain. She did notice some facial and lip swelling after getting oxaliplatin last time which resolved on its own. She did not have any reaction during chemo. Did not have any sob post chemo. Bowel movements have been regular  ECOG PS- 0 Pain scale- 0   Review of systems- Review of Systems  Constitutional: Negative for chills, fever, malaise/fatigue and weight loss.  HENT: Negative for congestion, ear discharge and nosebleeds.   Eyes: Negative for blurred vision.  Respiratory: Negative for cough, hemoptysis, sputum production, shortness of breath and wheezing.   Cardiovascular: Negative for chest pain, palpitations, orthopnea and claudication.  Gastrointestinal: Negative for abdominal pain, blood in stool, constipation, diarrhea, heartburn, melena, nausea and vomiting.  Genitourinary: Negative for dysuria, flank pain, frequency, hematuria and urgency.  Musculoskeletal: Negative for back pain, joint pain and myalgias.  Skin: Negative for rash.  Neurological: Positive for tingling. Negative for dizziness, focal weakness, seizures, weakness and headaches.  Endo/Heme/Allergies: Does not bruise/bleed easily.  Psychiatric/Behavioral: Negative for depression and suicidal ideas. The patient does not have insomnia.       No Known Allergies   Past Medical History:  Diagnosis Date  . Anemia   . Cancer of sigmoid colon ([place]) 11/11/2017   Partial sigmoid colon resection.   . Diverticulosis   . Genetic testing 12/14/2017   Common Cancers panel (47 genes) @ [facility] - No pathogenic mutations detected  . Headache   . Heart murmur   . Hyperlipidemia   . Hypothyroidism   . Thyroid disease      Past Surgical History:  Procedure Laterality Date  . APPENDECTOMY     possible during hysterectomy  . BOWEL RESECTION N/A 11/11/2017   Procedure: SMALL BOWEL  RESECTION;  Surgeon: Jules Husbands, MD;  Location: [facility] ORS;  Service: General;  Laterality: N/A;  . FLEXIBLE SIGMOIDOSCOPY N/A 11/07/2017   Procedure: Beryle Quant;  Surgeon: Jonathon Bellows, MD;  Location: [facility];  Service: Gastroenterology;  Laterality: N/A;  . LAPAROSCOPIC SIGMOID COLECTOMY N/A 11/11/2017   Procedure: LAPAROSCOPIC SIGMOID COLECTOMY;  Surgeon: Jules Husbands, MD;  Location: [facility] ORS;  Service: General;  Laterality: N/A;  . MOUTH SURGERY    . PORTACATH PLACEMENT Right 11/29/2017   Procedure: INSERTION PORT-A-CATH;  Surgeon: Jules Husbands, MD;  Location: [facility];  Service: General;  Laterality: Right;  May do local and IV sedation. Please have U/S  . TOTAL ABDOMINAL HYSTERECTOMY  05/20/2014   ooperectomy unilateral    Social History   Socioeconomic History  . Marital status: Married    Spouse name: Not on file  . Number of children: Not on file  . Years of education: Not on file  . Highest education level: Not on file  Occupational History  . Not on file  Social Needs  . Financial resource strain: Not on file  . Food insecurity:    Worry: Not on file    Inability: Not on file  . Transportation needs:    Medical: Not on file    Non-medical: Not on file  Tobacco Use  . Smoking status: Never Smoker  . Smokeless tobacco: Never Used  Substance and Sexual Activity  . Alcohol use: No    Alcohol/week: 0.0 oz  . Drug use: No  . Sexual activity: Yes  Lifestyle  . Physical activity:    Days per week: Not on file    Minutes per session: Not on file  . Stress: Not on file  Relationships  . Social connections:    Talks on phone: Not on file    Gets together: Not on file    Attends religious service: Not on file    Active member of club or organization: Not on file    Attends meetings of clubs or organizations: Not on file    Relationship status: Not on file  . Intimate partner violence:    Fear of current or ex partner: Not on file      Emotionally abused: Not on file    Physically abused: Not on file    Forced sexual activity: Not on file  Other Topics Concern  . Not on file  Social History Narrative  . Not on file    Family History  Problem Relation Age of Onset  . Breast cancer Mother 35       currently 13  . Hypertension Mother   . Thyroid disease Brother   . Hernia Brother   . Thyroid cancer Maternal Grandmother 49       deceased 42  . Stomach cancer Paternal Grandfather        unconfirmed; deceased 6  . Stomach cancer Paternal Aunt 62       deceased 78  . Colon cancer Maternal Aunt 68       also lung cancer  . Stomach cancer Maternal Uncle 2     Current Outpatient Medications:  .  APPLE CIDER VINEGAR PO, Take 1 tablet by mouth daily., Disp: , Rfl:  .  Biotin 1000 MCG tablet, Take 1,000 mcg by mouth daily., Disp: , Rfl:  .  Cyanocobalamin (VITAMIN B 12 PO), Take by mouth., Disp: , Rfl:  .  dexamethasone (DECADRON) 4 MG tablet, Take 2 tablets (8 mg total)  by mouth daily. Start the day after chemotherapy for 2 days. Take with food., Disp: 30 tablet, Rfl: 1 .  levothyroxine (SYNTHROID, LEVOTHROID) 175 MCG tablet, Take 1 tablet (175 mcg total) by mouth daily before breakfast., Disp: 30 tablet, Rfl: 5 .  lidocaine-prilocaine (EMLA) cream, Apply to affected area once, Disp: 30 g, Rfl: 3 .  loratadine (CLARITIN) 10 MG tablet, Take 10 mg by mouth daily., Disp: , Rfl:  .  Multiple Vitamin tablet, Take 2 tablets by mouth daily. , Disp: , Rfl:  .  OMEGA-3 FATTY ACIDS PO, Take 2 capsules by mouth daily. , Disp: , Rfl:  .  VITAMIN D, CHOLECALCIFEROL, PO, Take 1 tablet by mouth daily., Disp: , Rfl:  .  LORazepam (ATIVAN) 0.5 MG tablet, Take 1 tablet (0.5 mg total) by mouth every 6 (six) hours as needed (Nausea or vomiting). (Patient not taking: Reported on 04/25/2018), Disp: 30 tablet, Rfl: 0 .  ondansetron (ZOFRAN) 8 MG tablet, Take 1 tablet (8 mg total) by mouth 2 (two) times daily as needed for refractory  nausea / vomiting. Start on day 3 after chemotherapy. (Patient not taking: Reported on 04/25/2018), Disp: 30 tablet, Rfl: 1 .  prochlorperazine (COMPAZINE) 10 MG tablet, Take 1 tablet (10 mg total) by mouth every 6 (six) hours as needed (Nausea or vomiting). (Patient not taking: Reported on 04/25/2018), Disp: 30 tablet, Rfl: 1 No current facility-administered medications for this visit.   Facility-Administered Medications Ordered in Other Visits:  .  fluorouracil (ADRUCIL) 5,000 mg in sodium chloride 0.9 % 150 mL chemo infusion, 2,400 mg/m2 (Treatment Plan Recorded), Intravenous, 1 day or 1 dose, Sindy Guadeloupe, MD, 5,000 mg at 04/25/18 1420 .  heparin lock flush 100 unit/mL, 500 Units, Intracatheter, PRN, Sindy Guadeloupe, MD .  heparin lock flush 100 unit/mL, 500 Units, Intravenous, Once, Sindy Guadeloupe, MD  Physical exam:  Vitals:   04/25/18 1043  BP: (!) 167/100  Pulse: 85  Resp: 18  Temp: 98.8 F (37.1 C)  TempSrc: Tympanic  SpO2: 99%  Weight: 203 lb 8 oz (92.3 kg)  Height: _0  (1.702 m)   Physical Exam  Constitutional: She is oriented to person, place, and time. She appears well-developed and well-nourished.  HENT:  Head: Normocephalic and atraumatic.  Eyes: Pupils are equal, round, and reactive to light. EOM are normal.  Neck: Normal range of motion.  Cardiovascular: Normal rate, regular rhythm and normal heart sounds.  Pulmonary/Chest: Effort normal and breath sounds normal.  Abdominal: Soft. Bowel sounds are normal.  Midline surgical scar has healed well  Neurological: She is alert and oriented to person, place, and time.  Skin: Skin is warm and dry.     CMP Latest Ref Rng & Units 04/25/2018  Glucose 65 - 99 mg/dL 118(H)  BUN 6 - 20 mg/dL 6  Creatinine 0.44 - 1.00 mg/dL 0.60  Sodium 135 - 145 mmol/L 138  Potassium 3.5 - 5.1 mmol/L 3.5  Chloride 101 - 111 mmol/L 106  CO2 22 - 32 mmol/L 24  Calcium 8.9 - 10.3 mg/dL 9.0  Total Protein 6.5 - 8.1 g/dL 6.8  Total Bilirubin  0.3 - 1.2 mg/dL 0.5  Alkaline Phos 38 - 126 U/L 152(H)  AST 15 - 41 U/L 44(H)  ALT 14 - 54 U/L 66(H)   CBC Latest Ref Rng & Units 04/25/2018  WBC 3.6 - 11.0 K/uL 11.1(H)  Hemoglobin 12.0 - 16.0 g/dL 11.7(L)  Hematocrit 35.0 - 47.0 % 33.6(L)  Platelets 150 -  440 K/uL 97(L)     Assessment and plan- Patient is a 47 y.o. 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 However I will proceed with chemotherapy as platelet counts are >75. Anemia is stable.  She does get Neulasta on day 3 due to chemo induced neutropenia in the past  Abnormal LFT's- ast improved. Alt and alk phos mildly worse. Bili normal. Ok to proceed with chemo.  She has had abnormal LFTs in the past and required a treatment break of 2 weeks before we restarted chemo.  She did have an MRI abdomen which did not show acute pathology in the past.  This is likely due to oxaliplatin  Chemo-induced peripheral neuropathy: Mild grade 1.  Continue to monitor if her neuropathy worsens, it would be okay to hold oxaliplatin for the remainder of cycles  Return to clinic in 2 weeks and see covering MD with CBC and CMP for cycle #10 of FOLFOX chemotherapy.  I will see her back in 4 weeks with CBC and CMP for cycle #11 of FOLFOX chemotherapy.  Plan is to complete 12 cycles followed by repeat scans   Visit Diagnosis 1. Cancer of sigmoid ([place])   2. Chemotherapy-induced thrombocytopenia   3. Abnormal LFTs   4. Chemotherapy-induced peripheral neuropathy ([place])      Dr. Randa Evens, MD, MPH [facility] [phone] 04/25/2018 3:55 PM

## 2018-04-26 ENCOUNTER — Telehealth: Payer: Self-pay | Admitting: *Deleted

## 2018-04-26 NOTE — Telephone Encounter (Signed)
Called to see how pt was today after her chemo. She had mentioned that her lips get tingly, face gets red, sometimes scratchy feeling. When I checked with her today she states that it usually happens on day 2, so nothing today. I asked her if she has ever tried benadryl to see if it could help and she thought about it but never done it. She may try tom. I will call her back tom. Afternoon to check on her.

## 2018-04-27 ENCOUNTER — Telehealth: Payer: Self-pay | Admitting: *Deleted

## 2018-04-27 ENCOUNTER — Inpatient Hospital Stay: Payer: Managed Care, Other (non HMO)

## 2018-04-27 ENCOUNTER — Other Ambulatory Visit: Payer: Self-pay | Admitting: Oncology

## 2018-04-27 VITALS — BP 145/92 | HR 83 | Temp 98.4°F | Resp 18

## 2018-04-27 DIAGNOSIS — C187 Malignant neoplasm of sigmoid colon: Secondary | ICD-10-CM

## 2018-04-27 DIAGNOSIS — Z5111 Encounter for antineoplastic chemotherapy: Secondary | ICD-10-CM | POA: Diagnosis not present

## 2018-04-27 MED ORDER — PEGFILGRASTIM INJECTION 6 MG/0.6ML ~~LOC~~
6.0000 mg | PREFILLED_SYRINGE | Freq: Once | SUBCUTANEOUS | Status: AC
  Administered 2018-04-27: 6 mg via SUBCUTANEOUS
  Filled 2018-04-27: qty 0.6

## 2018-04-27 MED ORDER — SODIUM CHLORIDE 0.9% FLUSH
10.0000 mL | INTRAVENOUS | Status: DC | PRN
  Administered 2018-04-27: 10 mL
  Filled 2018-04-27: qty 10

## 2018-04-27 MED ORDER — HEPARIN SOD (PORK) LOCK FLUSH 100 UNIT/ML IV SOLN
500.0000 [IU] | Freq: Once | INTRAVENOUS | Status: AC | PRN
  Administered 2018-04-27: 500 [IU]
  Filled 2018-04-27: qty 5

## 2018-04-27 MED ORDER — DEXAMETHASONE 4 MG PO TABS: 4.0000 mg | ORAL_TABLET | Freq: Two times a day (BID) | ORAL | 0 refills | Status: AC

## 2018-04-27 NOTE — Telephone Encounter (Signed)
Patient was in today to get d/c pump and neulasta and I stopped in to check with her about how she is feeling. She mentioned to dr Janese Banks that after chemo when she is at home she feels like face is red, lips are red, mouth tingling, throat scratchy, tongue swells.  She states right now her lips are red, tongue is swollen but she can eat but it seems like she has slight pain with first swallow and then it is ok. Eyes are watery.  She was going to take benadryl but she did not have any in her house.  I spoke to Janese Banks and she states to have pt take decadron 4 mg bid x 3 days and for next treatment she will add benadryl to premeds to see if that will help and also she can take benadryl at home.  Mitchell Heir will tell pt that

## 2018-05-09 ENCOUNTER — Encounter: Payer: Self-pay | Admitting: Hematology and Oncology

## 2018-05-09 ENCOUNTER — Inpatient Hospital Stay: Payer: Managed Care, Other (non HMO) | Admitting: Hematology and Oncology

## 2018-05-09 ENCOUNTER — Inpatient Hospital Stay: Payer: Managed Care, Other (non HMO)

## 2018-05-09 ENCOUNTER — Other Ambulatory Visit: Payer: Self-pay

## 2018-05-09 VITALS — BP 154/91 | HR 80 | Temp 96.9°F | Resp 18 | Wt 209.8 lb

## 2018-05-09 DIAGNOSIS — G620 Drug-induced polyneuropathy: Secondary | ICD-10-CM | POA: Diagnosis not present

## 2018-05-09 DIAGNOSIS — D500 Iron deficiency anemia secondary to blood loss (chronic): Secondary | ICD-10-CM

## 2018-05-09 DIAGNOSIS — R1310 Dysphagia, unspecified: Secondary | ICD-10-CM

## 2018-05-09 DIAGNOSIS — C187 Malignant neoplasm of sigmoid colon: Secondary | ICD-10-CM

## 2018-05-09 DIAGNOSIS — R945 Abnormal results of liver function studies: Secondary | ICD-10-CM

## 2018-05-09 DIAGNOSIS — E039 Hypothyroidism, unspecified: Secondary | ICD-10-CM

## 2018-05-09 DIAGNOSIS — T451X5A Adverse effect of antineoplastic and immunosuppressive drugs, initial encounter: Secondary | ICD-10-CM

## 2018-05-09 DIAGNOSIS — D696 Thrombocytopenia, unspecified: Secondary | ICD-10-CM | POA: Diagnosis not present

## 2018-05-09 DIAGNOSIS — Z5111 Encounter for antineoplastic chemotherapy: Secondary | ICD-10-CM | POA: Diagnosis not present

## 2018-05-09 DIAGNOSIS — R7989 Other specified abnormal findings of blood chemistry: Secondary | ICD-10-CM

## 2018-05-09 LAB — CBC WITH DIFFERENTIAL/PLATELET
BASOS ABS: 0 10*3/uL (ref 0–0.1)
BASOS PCT: 0 %
Eosinophils Absolute: 0.1 10*3/uL (ref 0–0.7)
Eosinophils Relative: 1 %
HEMATOCRIT: 32.2 % — AB (ref 35.0–47.0)
HEMOGLOBIN: 11.3 g/dL — AB (ref 12.0–16.0)
Lymphocytes Relative: 29 %
Lymphs Abs: 2 10*3/uL (ref 1.0–3.6)
MCH: 36.1 pg — ABNORMAL HIGH (ref 26.0–34.0)
MCHC: 35.1 g/dL (ref 32.0–36.0)
MCV: 103 fL — ABNORMAL HIGH (ref 80.0–100.0)
Monocytes Absolute: 0.8 10*3/uL (ref 0.2–0.9)
Monocytes Relative: 11 %
NEUTROS PCT: 59 %
Neutro Abs: 4.2 10*3/uL (ref 1.4–6.5)
Platelets: 75 10*3/uL — ABNORMAL LOW (ref 150–440)
RBC: 3.12 MIL/uL — AB (ref 3.80–5.20)
RDW: 18.1 % — AB (ref 11.5–14.5)
WBC: 7.1 10*3/uL (ref 3.6–11.0)

## 2018-05-09 LAB — COMPREHENSIVE METABOLIC PANEL
ALBUMIN: 3.5 g/dL (ref 3.5–5.0)
ALT: 75 U/L — AB (ref 14–54)
ANION GAP: 9 (ref 5–15)
AST: 78 U/L — AB (ref 15–41)
Alkaline Phosphatase: 156 U/L — ABNORMAL HIGH (ref 38–126)
BUN: 8 mg/dL (ref 6–20)
CALCIUM: 8.9 mg/dL (ref 8.9–10.3)
CO2: 23 mmol/L (ref 22–32)
Chloride: 107 mmol/L (ref 101–111)
Creatinine, Ser: 0.67 mg/dL (ref 0.44–1.00)
GFR calc non Af Amer: >60 mL/min (ref >60)
Glucose, Bld: 132 mg/dL — ABNORMAL HIGH (ref 65–99)
Potassium: 3.6 mmol/L (ref 3.5–5.1)
SODIUM: 139 mmol/L (ref 135–145)
TOTAL PROTEIN: 6.8 g/dL (ref 6.5–8.1)
Total Bilirubin: 0.6 mg/dL (ref 0.3–1.2)

## 2018-05-09 LAB — T4, FREE: Free T4: 0.78 ng/dL — ABNORMAL LOW (ref 0.82–1.77)

## 2018-05-09 LAB — TSH: TSH: 44.829 u[IU]/mL — ABNORMAL HIGH (ref 0.350–4.500)

## 2018-05-09 MED ORDER — SODIUM CHLORIDE 0.9% FLUSH
10.0000 mL | Freq: Once | INTRAVENOUS | Status: AC
  Administered 2018-05-09: 10 mL via INTRAVENOUS
  Filled 2018-05-09: qty 10

## 2018-05-09 MED ORDER — HEPARIN SOD (PORK) LOCK FLUSH 100 UNIT/ML IV SOLN
500.0000 [IU] | Freq: Once | INTRAVENOUS | Status: AC
  Administered 2018-05-09: 500 [IU] via INTRAVENOUS
  Filled 2018-05-09: qty 5

## 2018-05-09 MED ORDER — LEVOTHYROXINE SODIUM 200 MCG PO TABS: 200.0000 ug | ORAL_TABLET | Freq: Every day | ORAL | 2 refills | Status: DC

## 2018-05-09 NOTE — Addendum Note (Signed)
Addended by: Cleaster Corin on: 05/09/2018 05:21 PM   Modules accepted: Orders

## 2018-05-09 NOTE — Progress Notes (Signed)
Patient here for follow up. Complaints of tingling numbness in feet getting worse after chemo.

## 2018-05-09 NOTE — Progress Notes (Signed)
Dr. Janese Banks,  Is there any known connection to Angla's chemo regimen and disruption of thyroid hormone stability?  Pt was previously very stable on dose of levothyroxine and this is only change that has occurred.  I have made a dose increase to compensate for the increased TSH/low Free T4.  Also referred to endocrine for additional input.  Thanks!  Cassell Smiles, DNP, AGPCNP-BC Adult Gerontology Primary Care Nurse Practitioner [facility]

## 2018-05-09 NOTE — Progress Notes (Signed)
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 Vicente Males from GI for recurrent episodes of diverticulitis sinceaugust2018 which was treated with antibiotics. However patient continued to have some abdominal cramping along with some bleeding in her stool and constipation alternating with diarrhea. Patient underwent flexible sigmoidoscopy on 11/07/2017. A circumferential infiltrative completely obstructing large mass was found in the rectosigmoid colon.Biopsy showed at least high-grade dysplasia. Patient was referred to Dr. Derinda Sis consideration of hemicolectomy.  Patient underwent laparoscopic low anterior resection along with small bowel resection and laparoscopic takedown of the splenic flexure on 11/11/2017. 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 Tumor size was 3 cm, moderately differentiated. All margins were negative for invasive carcinoma. Perineural invasion and lymphovascular invasion was present. 1 out of 17 lymph nodes was positive for malignancy. Pathologic stage was pT4b pN1a. MSI stable  Preoperative CEA is not available. CT abdomen on 10/26/2017 showed residual changes of diverticulitis although improved when compared to prior exam. No perforation or abscess is identified.  No family history of colon cancer. History of breast cancer in her mother in her 25s. Genetic testing was  negative  Interval history- Patient was last seen by Dr. Janese Banks on 04/25/2018 for cycle #9.  She had a grade I neuropathy.  Platelet count was 97,000.  ALT and alk phos were mildly worse. Bilirubin was normal.  She was noted to have abnormal LFTs in the past and required a treatment break of 2 weeks.  Abdominal MRI in the past did not show any acute pathology.   At pump disconnect last time, she spoke with Moishe Spice, RN.  She mentioned to Dr. Janese Banks that after chemo when she is at home, she feels like face is red, lips are red, mouth tingling, throat scratchy, and tongue swells.  She stated that her lips were red, tongue was swollen but she can eat but it seems like she has slight pain with first swallow and then it is ok.  Eyes were watery.  She was going to take Benadryl but she did not have any in her house.  Dr. Janese Banks had the patient take Decadron 4 mg bid x 3 days and for next treatment she will add Benadryl to premeds to see if that will help and also she can take Benadryl at home.  During the interim, patient has been having difficulties with eating. She notes difficulties swallowing. When patient swallows food boluses, she notes that it "just sits there".  Food causes patient to feel bloated. She notes variable bowel habits. Patient states, "It fluctuates back and forth between diarrhea and constipation". Weight is up 6 pounds.  Symptoms persistent for about a week after treatment. She experiences significant bone pain after Neulasta injection despite loratadine use.   Patient denies palmar-plantar erythrodysesthesia. She has had constant neuropathy in her feet for over a month. Peripheral neuropathy does not cause difficulties with ambulation. She has transient neuropathy in her hands. Her symptoms do not keep her up at night. She  notes some transient numbness and tingling in her lips and tongue at time. Patient advising that cold foods/fluids cause a "pins and needles" sensation in her oral cavity  that extends down the length of her esophagus. Patient has had excessive tearing since she started treatments.   Patient denies B symptoms. She has not experienced any interval infections. Pain is well managed at this point. She notes generalized pain, rated 2/10, in clinic today.   Performance status (ECOG): 1 - Symptomatic but completely ambulatory  Pain scale- 2/10  Review of systems- Review of Systems  Constitutional: Negative for chills, fever, malaise/fatigue and weight loss (Weight up 6 pounds).  HENT: Negative for congestion, ear discharge and nosebleeds.   Eyes: Negative for blurred vision.  Respiratory: Negative for cough, hemoptysis, sputum production, shortness of breath and wheezing.   Cardiovascular: Negative for chest pain, palpitations, orthopnea and claudication.  Gastrointestinal: Positive for constipation and diarrhea. Negative for abdominal pain, blood in stool, heartburn, melena, nausea and vomiting.       Difficulty eating and swallowing.  Genitourinary: Negative for dysuria, flank pain, frequency, hematuria and urgency.  Musculoskeletal: Negative for back pain, joint pain and myalgias.       Bone pain post Neulasta.  Skin: Negative for rash.  Neurological: Positive for tingling (lips and tongue). Negative for dizziness, focal weakness, seizures, weakness and headaches.       Constant neuropathy x 1 month.  Endo/Heme/Allergies: Does not bruise/bleed easily.  Psychiatric/Behavioral: Negative for depression and suicidal ideas. The patient does not have insomnia.      No Known Allergies   Past Medical History:  Diagnosis Date  . Anemia   . Cancer of sigmoid colon ([place]) 11/11/2017   Partial sigmoid colon resection.   . Diverticulosis   . Genetic testing 12/14/2017   Common Cancers panel (47 genes) @ [facility] - No pathogenic mutations detected  . Headache   . Heart murmur   . Hyperlipidemia   . Hypothyroidism   . Thyroid disease      Past Surgical History:   Procedure Laterality Date  . APPENDECTOMY     possible during hysterectomy  . BOWEL RESECTION N/A 11/11/2017   Procedure: SMALL BOWEL RESECTION;  Surgeon: Jules Husbands, MD;  Location: [facility] ORS;  Service: General;  Laterality: N/A;  . FLEXIBLE SIGMOIDOSCOPY N/A 11/07/2017   Procedure: Beryle Quant;  Surgeon: Jonathon Bellows, MD;  Location: [facility] ENDOSCOPY;  Service: Gastroenterology;  Laterality: N/A;  . LAPAROSCOPIC SIGMOID COLECTOMY N/A 11/11/2017   Procedure: LAPAROSCOPIC SIGMOID COLECTOMY;  Surgeon: Jules Husbands, MD;  Location: [facility] ORS;  Service: General;  Laterality: N/A;  . MOUTH SURGERY    . PORTACATH PLACEMENT Right 11/29/2017   Procedure: INSERTION PORT-A-CATH;  Surgeon: Jules Husbands, MD;  Location: [place];  Service: General;  Laterality: Right;  May do local and IV sedation. Please have U/S  . TOTAL ABDOMINAL HYSTERECTOMY  05/20/2014   ooperectomy unilateral    Social History   Socioeconomic History  . Marital status: Married    Spouse name: Not on file  . Number of children: Not on file  . Years of education: Not on file  . Highest education level: Not on file  Occupational History  . Not on file  Social Needs  . Financial resource strain: Not on file  . Food insecurity:    Worry: Not on file    Inability: Not on file  . Transportation needs:    Medical: Not on file  Non-medical: Not on file  Tobacco Use  . Smoking status: Never Smoker  . Smokeless tobacco: Never Used  Substance and Sexual Activity  . Alcohol use: No    Alcohol/week: 0.0 oz  . Drug use: No  . Sexual activity: Yes  Lifestyle  . Physical activity:    Days per week: Not on file    Minutes per session: Not on file  . Stress: Not on file  Relationships  . Social connections:    Talks on phone: Not on file    Gets together: Not on file    Attends religious service: Not on file    Active member of club or organization: Not on file    Attends meetings of clubs or  organizations: Not on file    Relationship status: Not on file  . Intimate partner violence:    Fear of current or ex partner: Not on file    Emotionally abused: Not on file    Physically abused: Not on file    Forced sexual activity: Not on file  Other Topics Concern  . Not on file  Social History Narrative  . Not on file    Family History  Problem Relation Age of Onset  . Breast cancer Mother 27       currently 75  . Hypertension Mother   . Thyroid disease Brother   . Hernia Brother   . Thyroid cancer Maternal Grandmother 59       deceased 64  . Stomach cancer Paternal Grandfather        unconfirmed; deceased 65  . Stomach cancer Paternal Aunt 4       deceased 69  . Colon cancer Maternal Aunt 68       also lung cancer  . Stomach cancer Maternal Uncle 33     Current Outpatient Medications:  .  APPLE CIDER VINEGAR PO, Take 1 tablet by mouth daily., Disp: , Rfl:  .  Biotin 1000 MCG tablet, Take 1,000 mcg by mouth daily., Disp: , Rfl:  .  Cyanocobalamin (VITAMIN B 12 PO), Take by mouth., Disp: , Rfl:  .  lidocaine-prilocaine (EMLA) cream, Apply to affected area once, Disp: 30 g, Rfl: 3 .  loratadine (CLARITIN) 10 MG tablet, Take 10 mg by mouth daily., Disp: , Rfl:  .  LORazepam (ATIVAN) 0.5 MG tablet, Take 1 tablet (0.5 mg total) by mouth every 6 (six) hours as needed (Nausea or vomiting)., Disp: 30 tablet, Rfl: 0 .  Multiple Vitamin tablet, Take 2 tablets by mouth daily. , Disp: , Rfl:  .  OMEGA-3 FATTY ACIDS PO, Take 2 capsules by mouth daily. , Disp: , Rfl:  .  ondansetron (ZOFRAN) 8 MG tablet, Take 1 tablet (8 mg total) by mouth 2 (two) times daily as needed for refractory nausea / vomiting. Start on day 3 after chemotherapy., Disp: 30 tablet, Rfl: 1 .  prochlorperazine (COMPAZINE) 10 MG tablet, Take 1 tablet (10 mg total) by mouth every 6 (six) hours as needed (Nausea or vomiting)., Disp: 30 tablet, Rfl: 1 .  VITAMIN D, CHOLECALCIFEROL, PO, Take 1 tablet by mouth  daily., Disp: , Rfl:  .  levothyroxine (SYNTHROID, LEVOTHROID) 200 MCG tablet, Take 1 tablet (200 mcg total) by mouth daily before breakfast., Disp: 30 tablet, Rfl: 2 No current facility-administered medications for this visit.   Facility-Administered Medications Ordered in Other Visits:  .  heparin lock flush 100 unit/mL, 500 Units, Intracatheter, PRN, Sindy Guadeloupe, MD  Physical exam:  Vitals:   05/09/18 0854  BP: (!) 154/91  Pulse: 80  Resp: 18  Temp: (!) 96.9 F (36.1 C)  TempSrc: Tympanic  Weight: 209 lb 12.8 oz (95.2 kg)   Physical Exam  Constitutional: She is oriented to person, place, and time. She appears well-developed and well-nourished.  HENT:  Head: Normocephalic and atraumatic.  Nose: Nose normal.  Mouth/Throat: Oropharynx is clear and moist.  Eyes: Pupils are equal, round, and reactive to light. Conjunctivae and EOM are normal. No scleral icterus.  Neck: Normal range of motion. Neck supple.  Cardiovascular: Normal rate, regular rhythm and normal heart sounds.  No murmur heard. Pulmonary/Chest: Effort normal and breath sounds normal. She has no wheezes. She has no rales.  Abdominal: Soft. Bowel sounds are normal. She exhibits no mass. There is no tenderness. There is no rebound and no guarding.  Midline surgical scar has healed well  Musculoskeletal: She exhibits no tenderness or deformity.  Neurological: She is alert and oriented to person, place, and time. A sensory deficit (difficulty distinguishing coins) is present.  Skin: Skin is warm and dry. No rash noted.     CMP Latest Ref Rng & Units 05/09/2018  Glucose 65 - 99 mg/dL 132(H)  BUN 6 - 20 mg/dL 8  Creatinine 0.44 - 1.00 mg/dL 0.67  Sodium 135 - 145 mmol/L 139  Potassium 3.5 - 5.1 mmol/L 3.6  Chloride 101 - 111 mmol/L 107  CO2 22 - 32 mmol/L 23  Calcium 8.9 - 10.3 mg/dL 8.9  Total Protein 6.5 - 8.1 g/dL 6.8  Total Bilirubin 0.3 - 1.2 mg/dL 0.6  Alkaline Phos 38 - 126 U/L 156(H)  AST 15 - 41 U/L  78(H)  ALT 14 - 54 U/L 75(H)   CBC Latest Ref Rng & Units 05/09/2018  WBC 3.6 - 11.0 K/uL 7.1  Hemoglobin 12.0 - 16.0 g/dL 11.3(L)  Hematocrit 35.0 - 47.0 % 32.2(L)  Platelets 150 - 440 K/uL 75(L)     Assessment and plan- Patient is a 47 y.o. 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  1. Labs today: CBC, CMP. 2. Labs reviewed. Persistent chemotherapy induced thrombocytopenia noted. Platelets 75,000 today. Will hold chemotherapy today.   3. Discuss progressive neuropathy. Neuropathy is mainly to the soles of her feet, however intermittently extends to her ankles. She has intermittent neuropathy in her hands that does not limit function. She would like to stop all chemotherapy.  Discuss discontinuation of oxaliplatin and consideration of 5FU/LV alone.. 4. Discuss plan of care. Initial plans were for interval imaging following completion of 12 cycles. This will be delayed now that the therapy regimen is having to be altered due to side effects. This will ultimately be up to patient's primary oncologist, Dr. Janese Banks. Will bring patient back in a week to check her counts. Will discuss change in therapy to 5FU + LV (no oxaliplatin) with Dr. Janese Banks due to patient's progressive neuropathy in her hands, feet, peri-oral and oropharyngeal areas.  5. RTC in 1 week for MD assessment and labs (CBC with diff, CMP, Mg)   Visit Diagnosis 1. Cancer of sigmoid ([place])   2. Thrombocytopenia ([place])   3. 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 Elevated LFTs   5. Iron deficiency anemia due to chronic blood loss      Honor Loh, NP 05/09/2018, 9:26 AM  I saw and evaluated the patient, participating  in the key portions of the service and reviewing pertinent diagnostic studies and records.  I reviewed the nurse practitioner's note and agree with the findings and the  plan.  The assessment and plan were discussed with the patient.  Multiple questions were asked by the patient and answered.   Nolon Stalls, MD 05/09/2018, 9:26 AM

## 2018-05-16 ENCOUNTER — Inpatient Hospital Stay (HOSPITAL_BASED_OUTPATIENT_CLINIC_OR_DEPARTMENT_OTHER): Payer: Managed Care, Other (non HMO) | Admitting: Hematology and Oncology

## 2018-05-16 ENCOUNTER — Encounter: Payer: Self-pay | Admitting: Hematology and Oncology

## 2018-05-16 ENCOUNTER — Other Ambulatory Visit: Payer: Self-pay

## 2018-05-16 ENCOUNTER — Inpatient Hospital Stay: Payer: Managed Care, Other (non HMO)

## 2018-05-16 VITALS — BP 168/98 | HR 87 | Temp 96.5°F | Resp 18 | Wt 210.8 lb

## 2018-05-16 DIAGNOSIS — C187 Malignant neoplasm of sigmoid colon: Secondary | ICD-10-CM

## 2018-05-16 DIAGNOSIS — G620 Drug-induced polyneuropathy: Secondary | ICD-10-CM | POA: Diagnosis not present

## 2018-05-16 DIAGNOSIS — Z5111 Encounter for antineoplastic chemotherapy: Secondary | ICD-10-CM | POA: Diagnosis not present

## 2018-05-16 DIAGNOSIS — T451X5A Adverse effect of antineoplastic and immunosuppressive drugs, initial encounter: Secondary | ICD-10-CM

## 2018-05-16 DIAGNOSIS — E039 Hypothyroidism, unspecified: Secondary | ICD-10-CM

## 2018-05-16 LAB — CBC WITH DIFFERENTIAL/PLATELET
Basophils Absolute: 0 10*3/uL (ref 0–0.1)
Basophils Relative: 0 %
Eosinophils Absolute: 0.1 10*3/uL (ref 0–0.7)
Eosinophils Relative: 1 %
HCT: 34.1 % — ABNORMAL LOW (ref 35.0–47.0)
Hemoglobin: 11.9 g/dL — ABNORMAL LOW (ref 12.0–16.0)
Lymphocytes Relative: 29 %
Lymphs Abs: 2 10*3/uL (ref 1.0–3.6)
MCH: 35.8 pg — ABNORMAL HIGH (ref 26.0–34.0)
MCHC: 34.8 g/dL (ref 32.0–36.0)
MCV: 102.9 fL — ABNORMAL HIGH (ref 80.0–100.0)
Monocytes Absolute: 0.4 10*3/uL (ref 0.2–0.9)
Monocytes Relative: 6 %
Neutro Abs: 4.4 10*3/uL (ref 1.4–6.5)
Neutrophils Relative %: 64 %
Platelets: 126 10*3/uL — ABNORMAL LOW (ref 150–440)
RBC: 3.31 MIL/uL — ABNORMAL LOW (ref 3.80–5.20)
RDW: 18.3 % — ABNORMAL HIGH (ref 11.5–14.5)
WBC: 6.8 10*3/uL (ref 3.6–11.0)

## 2018-05-16 LAB — COMPREHENSIVE METABOLIC PANEL
ALT: 87 U/L — ABNORMAL HIGH (ref 14–54)
AST: 54 U/L — ABNORMAL HIGH (ref 15–41)
Albumin: 3.8 g/dL (ref 3.5–5.0)
Alkaline Phosphatase: 122 U/L (ref 38–126)
Anion gap: 9 (ref 5–15)
BUN: 7 mg/dL (ref 6–20)
CO2: 24 mmol/L (ref 22–32)
Calcium: 9 mg/dL (ref 8.9–10.3)
Chloride: 106 mmol/L (ref 101–111)
Creatinine, Ser: 0.62 mg/dL (ref 0.44–1.00)
GFR calc Af Amer: >60 mL/min (ref >60)
GFR calc non Af Amer: >60 mL/min (ref >60)
Glucose, Bld: 121 mg/dL — ABNORMAL HIGH (ref 65–99)
Potassium: 3.4 mmol/L — ABNORMAL LOW (ref 3.5–5.1)
Sodium: 139 mmol/L (ref 135–145)
Total Bilirubin: 0.6 mg/dL (ref 0.3–1.2)
Total Protein: 7 g/dL (ref 6.5–8.1)

## 2018-05-16 LAB — MAGNESIUM: Magnesium: 1.8 mg/dL (ref 1.7–2.4)

## 2018-05-16 NOTE — Progress Notes (Signed)
Patient here for follow up. Friday pt states that she had itching all over and on Saturday she noted a rash all over her body. Took Benadryl Prn and it was effective.

## 2018-05-16 NOTE — Progress Notes (Signed)
   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 Anna from GI for recurrent episodes of diverticulitis sinceaugust2018 which was treated with antibiotics. However patient continued to have some abdominal cramping along with some bleeding in her stool and constipation alternating with diarrhea. Patient underwent flexible sigmoidoscopy on 11/07/2017. A circumferential infiltrative completely obstructing large mass was found in the rectosigmoid colon.Biopsy showed at least high-grade dysplasia. Patient was referred to Dr. Pabonfor consideration of hemicolectomy.  Patient underwent laparoscopic low anterior resection along with small bowel resection and laparoscopic takedown of the splenic flexure on 11/11/2017. 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 Tumor size was 3 cm, moderately differentiated. All margins were negative for invasive carcinoma. Perineural invasion and lymphovascular invasion was present. 1 out of 17 lymph nodes was positive for malignancy. Pathologic stage was pT4b pN1a. MSI stable  Preoperative CEA is not available. CT abdomen on 10/26/2017 showed residual changes of diverticulitis although improved when compared to prior exam. No perforation or abscess is identified.  No family history of colon cancer. History of breast cancer in her mother in her 40s. Genetic testing was  negative  Interval history- Patient was last seen in the medical oncology clinic on 05/09/2018.  At that time, patient was complaining of difficulties swallowing.  She noted that food caused her to feel bloated.  She had variable bowel habits; alternating between diarrhea and constipation.  Weight was up 6 pounds.  She noted significant bone pain following Neulasta despite loratadine use.  Patient complained of constant neuropathy in her feet for over a month.  She denied difficulties with ambulation.  She also describes transient neuropathy in her hands, as well as numbness and tingling to her lips and tongue at times.  Patient had excessive tearing since she started treatments.  Patient presents today feeling "some better". She notes persistent neuropathy in her hands and feet. Perioral numbness/tingling has resolved. She notes that she was advised last week that her thyroid had "stopped working".  Medications were adjusted by PCP. She notes that it often takes her quite sometime for her to be able to appreciate any improvement after dose adjustments have been made to her thyroid medication.  Patient denies B symptoms and interval infections. Patient is eating well. Weight is up 1 pound. She denies pain in the clinic today.   Performance status (ECOG): 1 - Symptomatic but completely ambulatory  Pain scale- 0  Review of systems- Review of Systems  Constitutional: Positive for malaise/fatigue. Negative for chills, fever and weight loss (Weight up 1 pound).  HENT: Negative for congestion, ear discharge and nosebleeds.   Eyes: Negative for blurred vision.       Excessive tearing  Respiratory: Negative for cough, hemoptysis, sputum production, shortness of breath and wheezing.   Cardiovascular: Negative for chest pain, palpitations, orthopnea and claudication.  Gastrointestinal: Positive for constipation and diarrhea. Negative for abdominal pain, blood in stool, heartburn, melena, nausea and  vomiting.       Difficulty   eating and swallowing.  Genitourinary: Negative for dysuria, flank pain, frequency, hematuria and urgency.  Musculoskeletal: Negative for back pain, joint pain and myalgias.       Bone pain after receiving Neulasta.  Skin: Negative for rash.  Neurological: Positive for tingling (lips and tongue, resolved). Negative for dizziness, focal weakness, seizures, weakness and headaches.       Persistent neuropathy in feet. Transient neuropathy in hands.   Endo/Heme/Allergies: Does not bruise/bleed easily.       HYPOthyroidism, recently adjusted Synthroid  Psychiatric/Behavioral: Negative for depression and suicidal ideas. The patient does not have insomnia.      No Known Allergies   Past Medical History:  Diagnosis Date  . Anemia   . Cancer of sigmoid colon (HCC) 11/11/2017   Partial sigmoid colon resection.   . Diverticulosis   . Genetic testing 12/14/2017   Common Cancers panel (47 genes) @ [facility] - No pathogenic mutations detected  . Headache   . Heart murmur   . Hyperlipidemia   . Hypothyroidism   . Thyroid disease      Past Surgical History:  Procedure Laterality Date  . APPENDECTOMY     possible during hysterectomy  . BOWEL RESECTION N/A 11/11/2017   Procedure: SMALL BOWEL RESECTION;  Surgeon: Pabon, Diego F, MD;  Location: [facility] ORS;  Service: General;  Laterality: N/A;  . FLEXIBLE SIGMOIDOSCOPY N/A 11/07/2017   Procedure: FLEXIBLE SIGMOIDOSCOPY;  Surgeon: Anna, Kiran, MD;  Location: [facility];  Service: Gastroenterology;  Laterality: N/A;  . LAPAROSCOPIC SIGMOID COLECTOMY N/A 11/11/2017   Procedure: LAPAROSCOPIC SIGMOID COLECTOMY;  Surgeon: Pabon, Diego F, MD;  Location: [facility] OR[facility];  Service: General;  Laterality: N/A;  . MOUTH SURGERY    . PORTACATH PLACEMENT Right 11/29/2017   Procedure: INSERTION PORT-A-CATH;  Surgeon: Pabon, Diego F, MD;  Location: [facility];  Service: General;  Laterality: Right;  May do local and IV sedation.  Please have U/S  . TOTAL ABDOMINAL HYSTERECTOMY  05/20/2014   ooperectomy unilateral    Social History   Socioeconomic History  . Marital status: Married    Spouse name: Not on file  . Number of children: Not on file  . Years of education: Not on file  . Highest education level: Not on file  Occupational History  . Not on file  Social Needs  . Financial resource strain: Not on file  . Food insecurity:    Worry: Not on file    Inability: Not on file  . Transportation needs:    Medical: Not on file    Non-medical: Not on file  Tobacco Use  . Smoking status: Never Smoker  . Smokeless tobacco: Never Used  Substance and Sexual Activity  . Alcohol use: No    Alcohol/week: 0.0 oz  . Drug use: No  . Sexual activity: Yes  Lifestyle  . Physical activity:    Days per week: Not on file    Minutes per session: Not on file  . Stress: Not on file  Relationships  . Social connections:    Talks on phone: Not on file    Gets together: Not on file    Attends religious service: Not on file    Active member of club or organization: Not on file    Attends meetings of clubs or organizations: Not on file    Relationship status: Not on file  . Intimate partner violence:    Fear of current or ex partner: Not on file    Emotionally   abused: Not on file    Physically abused: Not on file    Forced sexual activity: Not on file  Other Topics Concern  . Not on file  Social History Narrative  . Not on file    Family History  Problem Relation Age of Onset  . Breast cancer Mother 22       currently 78  . Hypertension Mother   . Thyroid disease Brother   . Hernia Brother   . Thyroid cancer Maternal Grandmother 32       deceased 76  . Stomach cancer Paternal Grandfather        unconfirmed; deceased 46  . Stomach cancer Paternal Aunt 31       deceased 10  . Colon cancer Maternal Aunt 68       also lung cancer  . Stomach cancer Maternal Uncle 69     Current Outpatient Medications:    .  APPLE CIDER VINEGAR PO, Take 1 tablet by mouth daily., Disp: , Rfl:  .  Biotin 1000 MCG tablet, Take 1,000 mcg by mouth daily., Disp: , Rfl:  .  Cyanocobalamin (VITAMIN B 12 PO), Take by mouth., Disp: , Rfl:  .  diphenhydrAMINE (BENADRYL) 25 MG tablet, Take 25 mg by mouth every 6 (six) hours as needed for itching or allergies., Disp: , Rfl:  .  levothyroxine (SYNTHROID, LEVOTHROID) 200 MCG tablet, Take 1 tablet (200 mcg total) by mouth daily before breakfast., Disp: 30 tablet, Rfl: 2 .  lidocaine-prilocaine (EMLA) cream, Apply to affected area once, Disp: 30 g, Rfl: 3 .  loratadine (CLARITIN) 10 MG tablet, Take 10 mg by mouth daily., Disp: , Rfl:  .  LORazepam (ATIVAN) 0.5 MG tablet, Take 1 tablet (0.5 mg total) by mouth every 6 (six) hours as needed (Nausea or vomiting)., Disp: 30 tablet, Rfl: 0 .  Multiple Vitamin tablet, Take 2 tablets by mouth daily. , Disp: , Rfl:  .  OMEGA-3 FATTY ACIDS PO, Take 2 capsules by mouth daily. , Disp: , Rfl:  .  ondansetron (ZOFRAN) 8 MG tablet, Take 1 tablet (8 mg total) by mouth 2 (two) times daily as needed for refractory nausea / vomiting. Start on day 3 after chemotherapy., Disp: 30 tablet, Rfl: 1 .  prochlorperazine (COMPAZINE) 10 MG tablet, Take 1 tablet (10 mg total) by mouth every 6 (six) hours as needed (Nausea or vomiting)., Disp: 30 tablet, Rfl: 1 .  VITAMIN D, CHOLECALCIFEROL, PO, Take 1 tablet by mouth daily., Disp: , Rfl:  No current facility-administered medications for this visit.   Facility-Administered Medications Ordered in Other Visits:  .  heparin lock flush 100 unit/mL, 500 Units, Intracatheter, PRN, Sindy Guadeloupe, MD  Physical exam:  Vitals:   05/16/18 0941  BP: (!) 168/98  Pulse: 87  Resp: 18  Temp: (!) 96.5 F (35.8 C)  TempSrc: Tympanic  Weight: 210 lb 12.8 oz (95.6 kg)   Physical Exam  Constitutional: She is oriented to person, place, and time. She appears well-developed and well-nourished.  HENT:  Head:  Normocephalic and atraumatic.  Short gray hair  Eyes: Conjunctivae are normal. No scleral icterus.  Blue eyes  Neurological: She is alert and oriented to person, place, and time.  Psychiatric: She has a normal mood and affect. Her behavior is normal.     CMP Latest Ref Rng & Units 05/16/2018  Glucose 65 - 99 mg/dL 121(H)  BUN 6 - 20 mg/dL 7  Creatinine 0.44 - 1.00 mg/dL 0.62  Sodium  135 - 145 mmol/L 139  Potassium 3.5 - 5.1 mmol/L 3.4(L)  Chloride 101 - 111 mmol/L 106  CO2 22 - 32 mmol/L 24  Calcium 8.9 - 10.3 mg/dL 9.0  Total Protein 6.5 - 8.1 g/dL 7.0  Total Bilirubin 0.3 - 1.2 mg/dL 0.6  Alkaline Phos 38 - 126 U/L 122  AST 15 - 41 U/L 54(H)  ALT 14 - 54 U/L 87(H)   CBC Latest Ref Rng & Units 05/16/2018  WBC 3.6 - 11.0 K/uL 6.8  Hemoglobin 12.0 - 16.0 g/dL 11.9(L)  Hematocrit 35.0 - 47.0 % 34.1(L)  Platelets 150 - 440 K/uL 126(L)     Assessment and plan- Patient is a 47 y.o. 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  1. Labs today: CBC, CMP, Mg. 2. Review chemotherapy induced thrombocytopenia. Platelets have improved to 126,000 today. Does not wish to continue with chemotherapy at this point. She states, "I feel like my body is done right now".  3. Discuss persistent neuropathy. Neuropathy is mainly to the soles of her feet, however intermittently extends to her ankles. She has intermittent neuropathy in her hands that does not limit function. She would like to stop all chemotherapy.  Discuss discontinuation of oxaliplatin and consideration of 5FU + LV alone for remainder of 12 cycles. 4. Discuss plan of care. Initial plans were for interval imaging following completion of 12 cycles. This will be delayed now that the therapy regimen is having to be altered due to side effects. Patient initially resistant, however after further discussion she  is amenable to a change in therapy. This will ultimately be up to patient's primary oncologist, Dr. Janese Banks. Discussed benefits of continued therapy. Will discuss change in therapy to 5FU + LV (no oxaliplatin) with Dr. Janese Banks due to patient's progressive neuropathy in her hands, feet, peri-oral and oropharyngeal areas. If Dr. Janese Banks in agreement, patient does not wish to start until after her daughter's wedding on 05/27/2018. 5. RTC on 05/30/2018 for MD (Dr Janese Banks) assessment, labs (CBC with diff, CMP), further discussion regarding therapy, and +/- 5FU + LV.    Visit Diagnosis 1. Cancer of sigmoid ([place])   2. Peripheral neuropathy due to chemotherapy [facility])      Honor Loh, NP 05/16/2018, 10:14 AM   I saw and evaluated the patient, participating in the key portions of the service and reviewing pertinent diagnostic studies and records.  I reviewed the nurse practitioner's note and agree with the findings and the plan.  The assessment and plan were discussed with the patient.  Multiple questions were asked by the patient and answered.   Nolon Stalls, MD 05/16/2018, 10:14 AM

## 2018-05-23 ENCOUNTER — Inpatient Hospital Stay: Payer: Managed Care, Other (non HMO)

## 2018-05-23 ENCOUNTER — Inpatient Hospital Stay: Payer: Managed Care, Other (non HMO) | Admitting: Oncology

## 2018-05-25 ENCOUNTER — Inpatient Hospital Stay: Payer: Managed Care, Other (non HMO)

## 2018-05-30 ENCOUNTER — Inpatient Hospital Stay (HOSPITAL_BASED_OUTPATIENT_CLINIC_OR_DEPARTMENT_OTHER): Payer: Managed Care, Other (non HMO) | Admitting: Oncology

## 2018-05-30 ENCOUNTER — Inpatient Hospital Stay: Payer: Managed Care, Other (non HMO) | Attending: Oncology

## 2018-05-30 ENCOUNTER — Inpatient Hospital Stay: Payer: Managed Care, Other (non HMO)

## 2018-05-30 ENCOUNTER — Encounter: Payer: Self-pay | Admitting: Oncology

## 2018-05-30 VITALS — BP 146/95 | HR 80 | Temp 96.9°F | Resp 18 | Ht 67.0 in | Wt 210.7 lb

## 2018-05-30 DIAGNOSIS — E876 Hypokalemia: Secondary | ICD-10-CM | POA: Diagnosis not present

## 2018-05-30 DIAGNOSIS — C187 Malignant neoplasm of sigmoid colon: Secondary | ICD-10-CM

## 2018-05-30 DIAGNOSIS — E039 Hypothyroidism, unspecified: Secondary | ICD-10-CM

## 2018-05-30 DIAGNOSIS — G620 Drug-induced polyneuropathy: Secondary | ICD-10-CM | POA: Diagnosis not present

## 2018-05-30 DIAGNOSIS — Z5111 Encounter for antineoplastic chemotherapy: Secondary | ICD-10-CM | POA: Insufficient documentation

## 2018-05-30 DIAGNOSIS — T451X5A Adverse effect of antineoplastic and immunosuppressive drugs, initial encounter: Secondary | ICD-10-CM

## 2018-05-30 DIAGNOSIS — R1310 Dysphagia, unspecified: Secondary | ICD-10-CM | POA: Insufficient documentation

## 2018-05-30 DIAGNOSIS — Z5189 Encounter for other specified aftercare: Secondary | ICD-10-CM | POA: Diagnosis not present

## 2018-05-30 LAB — COMPREHENSIVE METABOLIC PANEL
ALT: 36 U/L (ref 14–54)
AST: 39 U/L (ref 15–41)
Albumin: 3.8 g/dL (ref 3.5–5.0)
Alkaline Phosphatase: 107 U/L (ref 38–126)
Anion gap: 9 (ref 5–15)
BUN: 10 mg/dL (ref 6–20)
CO2: 22 mmol/L (ref 22–32)
Calcium: 9.3 mg/dL (ref 8.9–10.3)
Chloride: 109 mmol/L (ref 101–111)
Creatinine, Ser: 0.66 mg/dL (ref 0.44–1.00)
GFR calc Af Amer: >60 mL/min (ref >60)
GFR calc non Af Amer: >60 mL/min (ref >60)
Glucose, Bld: 125 mg/dL — ABNORMAL HIGH (ref 65–99)
Potassium: 3.7 mmol/L (ref 3.5–5.1)
Sodium: 140 mmol/L (ref 135–145)
Total Bilirubin: 0.7 mg/dL (ref 0.3–1.2)
Total Protein: 7 g/dL (ref 6.5–8.1)

## 2018-05-30 LAB — CBC WITH DIFFERENTIAL/PLATELET
Basophils Absolute: 0 10*3/uL (ref 0–0.1)
Basophils Relative: 1 %
Eosinophils Absolute: 0.1 10*3/uL (ref 0–0.7)
Eosinophils Relative: 4 %
HCT: 34.7 % — ABNORMAL LOW (ref 35.0–47.0)
Hemoglobin: 12.2 g/dL (ref 12.0–16.0)
Lymphocytes Relative: 39 %
Lymphs Abs: 1.6 10*3/uL (ref 1.0–3.6)
MCH: 35.1 pg — ABNORMAL HIGH (ref 26.0–34.0)
MCHC: 35.1 g/dL (ref 32.0–36.0)
MCV: 100.1 fL — ABNORMAL HIGH (ref 80.0–100.0)
Monocytes Absolute: 0.4 10*3/uL (ref 0.2–0.9)
Monocytes Relative: 9 %
Neutro Abs: 2 10*3/uL (ref 1.4–6.5)
Neutrophils Relative %: 49 %
Platelets: 119 10*3/uL — ABNORMAL LOW (ref 150–440)
RBC: 3.47 MIL/uL — ABNORMAL LOW (ref 3.80–5.20)
RDW: 15.4 % — ABNORMAL HIGH (ref 11.5–14.5)
WBC: 4 10*3/uL (ref 3.6–11.0)

## 2018-05-30 MED ORDER — DULOXETINE HCL 30 MG PO CPEP: 30.0000 mg | ORAL_CAPSULE | Freq: Every day | ORAL | 0 refills | Status: DC

## 2018-05-30 MED ORDER — SODIUM CHLORIDE 0.9 % IV SOLN
2400.0000 mg/m2 | INTRAVENOUS | Status: DC
  Administered 2018-05-30: 5000 mg via INTRAVENOUS
  Filled 2018-05-30: qty 100

## 2018-05-30 MED ORDER — SODIUM CHLORIDE 0.9% FLUSH
10.0000 mL | Freq: Once | INTRAVENOUS | Status: AC
  Administered 2018-05-30: 10 mL via INTRAVENOUS
  Filled 2018-05-30: qty 10

## 2018-05-30 MED ORDER — DIPHENHYDRAMINE HCL 50 MG/ML IJ SOLN
25.0000 mg | Freq: Once | INTRAMUSCULAR | Status: AC
  Administered 2018-05-30: 25 mg via INTRAVENOUS
  Filled 2018-05-30: qty 1

## 2018-05-30 MED ORDER — FLUOROURACIL CHEMO INJECTION 2.5 GM/50ML
400.0000 mg/m2 | Freq: Once | INTRAVENOUS | Status: AC
  Administered 2018-05-30: 850 mg via INTRAVENOUS
  Filled 2018-05-30: qty 17

## 2018-05-30 MED ORDER — HEPARIN SOD (PORK) LOCK FLUSH 100 UNIT/ML IV SOLN
500.0000 [IU] | Freq: Once | INTRAVENOUS | Status: DC
  Filled 2018-05-30: qty 5

## 2018-05-30 MED ORDER — LEUCOVORIN CALCIUM INJECTION 350 MG: 850.0000 mg | Freq: Once | INTRAVENOUS | Status: DC

## 2018-05-30 MED ORDER — DEXTROSE 5 % IV SOLN
Freq: Once | INTRAVENOUS | Status: AC
  Administered 2018-05-30: 10:00:00 via INTRAVENOUS
  Filled 2018-05-30: qty 1000

## 2018-05-30 MED ORDER — LEUCOVORIN CALCIUM INJECTION 100 MG
20.0000 mg/m2 | Freq: Once | INTRAMUSCULAR | Status: AC
  Administered 2018-05-30: 42 mg via INTRAVENOUS
  Filled 2018-05-30: qty 2.1

## 2018-05-30 MED ORDER — PALONOSETRON HCL INJECTION 0.25 MG/5ML
0.2500 mg | Freq: Once | INTRAVENOUS | Status: AC
  Administered 2018-05-30: 0.25 mg via INTRAVENOUS
  Filled 2018-05-30: qty 5

## 2018-05-30 MED ORDER — DEXAMETHASONE SODIUM PHOSPHATE 10 MG/ML IJ SOLN
10.0000 mg | Freq: Once | INTRAMUSCULAR | Status: AC
  Administered 2018-05-30: 10 mg via INTRAVENOUS
  Filled 2018-05-30: qty 1

## 2018-05-30 NOTE — Progress Notes (Signed)
No new changes noted today 

## 2018-05-30 NOTE — Progress Notes (Signed)
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 Vicente Males from GI for recurrent episodes of diverticulitis sinceaugust2018 which was treated with antibiotics. However patient continued to have some abdominal cramping along with some bleeding in her stool and constipation alternating with diarrhea. Patient underwent flexible sigmoidoscopy on 11/07/2017. A circumferential infiltrative completely obstructing large mass was found in the rectosigmoid colon.Biopsy showed at least high-grade dysplasia. Patient was referred to Dr. Derinda Sis consideration of hemicolectomy.  Patient underwent laparoscopic low anterior resection along with small bowel resection and laparoscopic takedown of the splenic flexure on 11/11/2017. 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 Tumor size was 3 cm, moderately differentiated. All margins were negative for invasive carcinoma. Perineural invasion and lymphovascular invasion was present. 1 out of 17 lymph nodes was positive for malignancy. Pathologic stage was pT4b pN1a. MSI stable  Preoperative CEA is not available. CT abdomen on 10/26/2017 showed residual changes of diverticulitis although improved when compared to prior exam. No perforation or abscess is identified.  No family history of colon cancer. History of breast cancer in her mother in her 84s. Genetic testing was  negative   Interval history-patient reports mild tingling and numbness in her hands and feet which has persisted since the last treatment.  Tingling sensation in her lips and lip swelling has gone away completely.  Bowel movements are regular.  Energy levels are better.  She denies any pain  ECOG PS- 0 Pain scale- 0  Review of systems- Review of Systems  Constitutional: Negative for chills, fever, malaise/fatigue and weight loss.  HENT: Negative for congestion, ear discharge and nosebleeds.   Eyes: Negative for blurred vision.  Respiratory: Negative for cough, hemoptysis, sputum production, shortness of breath and wheezing.   Cardiovascular: Negative for chest pain, palpitations, orthopnea and claudication.  Gastrointestinal: Negative for abdominal pain, blood in stool, constipation, diarrhea, heartburn, melena, nausea and vomiting.  Genitourinary: Negative for dysuria, flank pain, frequency, hematuria and urgency.  Musculoskeletal: Negative for back pain, joint pain and myalgias.  Skin: Negative for rash.  Neurological: Positive for tingling. Negative for dizziness, focal weakness, seizures, weakness and headaches.  Endo/Heme/Allergies: Does not bruise/bleed easily.  Psychiatric/Behavioral: Negative for depression and suicidal ideas. The patient does not have insomnia.       No Known Allergies   Past Medical History:  Diagnosis Date  . Anemia   . Cancer of sigmoid colon ([place]) 11/11/2017   Partial sigmoid colon resection.   . Diverticulosis   . Genetic testing 12/14/2017   Common Cancers panel (47 genes) @ [facility] - No pathogenic mutations detected  . Headache   . Heart murmur   . Hyperlipidemia   . Hypothyroidism   . Thyroid disease      Past Surgical History:  Procedure Laterality Date  . APPENDECTOMY     possible during hysterectomy  . BOWEL RESECTION N/A 11/11/2017   Procedure: SMALL BOWEL RESECTION;  Surgeon: Jules Husbands, MD;  Location: [facility] ORS;  Service:  General;  Laterality: N/A;  . FLEXIBLE SIGMOIDOSCOPY N/A 11/07/2017   Procedure: FLEXIBLE SIGMOIDOSCOPY;  Surgeon: Jonathon Bellows, MD;  Location: [facility] ENDOSCOPY;  Service: Gastroenterology;  Laterality: N/A;  . LAPAROSCOPIC SIGMOID COLECTOMY N/A 11/11/2017   Procedure: LAPAROSCOPIC SIGMOID COLECTOMY;  Surgeon: Jules Husbands, MD;  Location: [facility] ORS;  Service: General;  Laterality: N/A;  . MOUTH SURGERY    . PORTACATH PLACEMENT Right 11/29/2017   Procedure: INSERTION PORT-A-CATH;  Surgeon: Jules Husbands, MD;  Location: [facility];  Service: General;  Laterality: Right;  May do local and IV sedation. Please have U/S  . TOTAL ABDOMINAL HYSTERECTOMY  05/20/2014   ooperectomy unilateral    Social History   Socioeconomic History  . Marital status: Married    Spouse name: Not on file  . Number of children: Not on file  . Years of education: Not on file  . Highest education level: Not on file  Occupational History  . Not on file  Social Needs  . Financial resource strain: Not on file  . Food insecurity:    Worry: Not on file    Inability: Not on file  . Transportation needs:    Medical: Not on file    Non-medical: Not on file  Tobacco Use  . Smoking status: Never Smoker  . Smokeless tobacco: Never Used  Substance and Sexual Activity  . Alcohol use: No    Alcohol/week: 0.0 oz  . Drug use: No  . Sexual activity: Yes  Lifestyle  . Physical activity:    Days per week: Not on file    Minutes per session: Not on file  . Stress: Not on file  Relationships  . Social connections:    Talks on phone: Not on file    Gets together: Not on file    Attends religious service: Not on file    Active member of club or organization: Not on file    Attends meetings of clubs or organizations: Not on file    Relationship status: Not on file  . Intimate partner violence:    Fear of current or ex partner: Not on file    Emotionally abused: Not on file    Physically abused: Not on file     Forced sexual activity: Not on file  Other Topics Concern  . Not on file  Social History Narrative  . Not on file    Family History  Problem Relation Age of Onset  . Breast cancer Mother 48       currently 63  . Hypertension Mother   . Thyroid disease Brother   . Hernia Brother   . Thyroid cancer Maternal Grandmother 36       deceased 11  . Stomach cancer Paternal Grandfather        unconfirmed; deceased 6  . Stomach cancer Paternal Aunt 71       deceased 78  . Colon cancer Maternal Aunt 68       also lung cancer  . Stomach cancer Maternal Uncle 27     Current Outpatient Medications:  .  APPLE CIDER VINEGAR PO, Take 1 tablet by mouth daily., Disp: , Rfl:  .  Biotin 1000 MCG tablet, Take 1,000 mcg by mouth daily., Disp: , Rfl:  .  Cyanocobalamin (VITAMIN B 12 PO), Take by mouth., Disp: , Rfl:  .  levothyroxine (SYNTHROID, LEVOTHROID) 200 MCG tablet, Take 1 tablet (200 mcg total) by mouth daily before breakfast., Disp: 30 tablet, Rfl: 2 .  loratadine (  CLARITIN) 10 MG tablet, Take 10 mg by mouth daily., Disp: , Rfl:  .  Multiple Vitamin tablet, Take 2 tablets by mouth daily. , Disp: , Rfl:  .  OMEGA-3 FATTY ACIDS PO, Take 2 capsules by mouth daily. , Disp: , Rfl:  .  VITAMIN D, CHOLECALCIFEROL, PO, Take 1 tablet by mouth daily., Disp: , Rfl:  .  diphenhydrAMINE (BENADRYL) 25 MG tablet, Take 25 mg by mouth every 6 (six) hours as needed for itching or allergies., Disp: , Rfl:  .  DULoxetine (CYMBALTA) 30 MG capsule, Take 1 capsule (30 mg total) by mouth daily. For 7 days and then bid for rest of the month, Disp: 59 capsule, Rfl: 0 .  lidocaine-prilocaine (EMLA) cream, Apply to affected area once (Patient not taking: Reported on 05/30/2018), Disp: 30 g, Rfl: 3 .  LORazepam (ATIVAN) 0.5 MG tablet, Take 1 tablet (0.5 mg total) by mouth every 6 (six) hours as needed (Nausea or vomiting). (Patient not taking: Reported on 05/30/2018), Disp: 30 tablet, Rfl: 0 .  ondansetron (ZOFRAN) 8 MG  tablet, Take 1 tablet (8 mg total) by mouth 2 (two) times daily as needed for refractory nausea / vomiting. Start on day 3 after chemotherapy. (Patient not taking: Reported on 05/30/2018), Disp: 30 tablet, Rfl: 1 .  prochlorperazine (COMPAZINE) 10 MG tablet, Take 1 tablet (10 mg total) by mouth every 6 (six) hours as needed (Nausea or vomiting). (Patient not taking: Reported on 05/30/2018), Disp: 30 tablet, Rfl: 1 No current facility-administered medications for this visit.   Facility-Administered Medications Ordered in Other Visits:  .  fluorouracil (ADRUCIL) 5,000 mg in sodium chloride 0.9 % 150 mL chemo infusion, 2,400 mg/m2 (Treatment Plan Recorded), Intravenous, 1 day or 1 dose, Sindy Guadeloupe, MD, 5,000 mg at 05/30/18 1105 .  heparin lock flush 100 unit/mL, 500 Units, Intracatheter, PRN, Sindy Guadeloupe, MD .  heparin lock flush 100 unit/mL, 500 Units, Intravenous, Once, Sindy Guadeloupe, MD  Physical exam:  Vitals:   05/30/18 0919  BP: (!) 146/95  Pulse: 80  Resp: 18  Temp: (!) 96.9 F (36.1 C)  TempSrc: Tympanic  SpO2: 100%  Weight: 210 lb 11.2 oz (95.6 kg)  Height: 5' 7"  (1.702 m)   Physical Exam  Constitutional: She is oriented to person, place, and time. She appears well-developed and well-nourished.  HENT:  Head: Normocephalic and atraumatic.  Eyes: Pupils are equal, round, and reactive to light. EOM are normal.  Neck: Normal range of motion.  Cardiovascular: Normal rate, regular rhythm and normal heart sounds.  Pulmonary/Chest: Effort normal and breath sounds normal.  Abdominal: Soft. Bowel sounds are normal.  Musculoskeletal:  Mild erythema noted over bilateral hands  Neurological: She is alert and oriented to person, place, and time.  Skin: Skin is warm and dry.     CMP Latest Ref Rng & Units 05/30/2018  Glucose 65 - 99 mg/dL 125(H)  BUN 6 - 20 mg/dL 10  Creatinine 0.44 - 1.00 mg/dL 0.66  Sodium 135 - 145 mmol/L 140  Potassium 3.5 - 5.1 mmol/L 3.7  Chloride 101 -  111 mmol/L 109  CO2 22 - 32 mmol/L 22  Calcium 8.9 - 10.3 mg/dL 9.3  Total Protein 6.5 - 8.1 g/dL 7.0  Total Bilirubin 0.3 - 1.2 mg/dL 0.7  Alkaline Phos 38 - 126 U/L 107  AST 15 - 41 U/L 39  ALT 14 - 54 U/L 36   CBC Latest Ref Rng & Units 05/30/2018  WBC 3.6 -  11.0 K/uL 4.0  Hemoglobin 12.0 - 16.0 g/dL 12.2  Hematocrit 35.0 - 47.0 % 34.7(L)  Platelets 150 - 440 K/uL 119(L)     Assessment and plan- Patient is a 47 y.o. 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  She even experienced lip tingling and swelling with the last cycle of oxaliplatin.  She is therefore no longer going to receive any further oxaliplatin.  Her counts are improved today and her LFTs have normalized.  She can proceed with cycle #10 of 5-FU leucovorin chemotherapy today.  She will get the remainder of the chemotherapy without any oxaliplatin.  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  I will see her back in 2 weeks time with CBC and CMP prior to cycle #11 o 5-FU leucovorin chemotherapy.  Plan to repeat scans after 12 cyclesf     Visit Diagnosis 1. Cancer of sigmoid ([place])   2. Encounter for antineoplastic chemotherapy   3. Chemotherapy-induced peripheral neuropathy ([place])      Dr. Randa Evens, MD, MPH [facility] at [facility] [phone] 05/30/2018 12:57 PM

## 2018-05-31 ENCOUNTER — Other Ambulatory Visit: Payer: Self-pay

## 2018-06-01 ENCOUNTER — Inpatient Hospital Stay: Payer: Managed Care, Other (non HMO)

## 2018-06-01 VITALS — BP 150/86 | HR 86 | Temp 96.5°F | Resp 18

## 2018-06-01 DIAGNOSIS — C187 Malignant neoplasm of sigmoid colon: Secondary | ICD-10-CM

## 2018-06-01 DIAGNOSIS — Z5111 Encounter for antineoplastic chemotherapy: Secondary | ICD-10-CM | POA: Diagnosis not present

## 2018-06-01 MED ORDER — HEPARIN SOD (PORK) LOCK FLUSH 100 UNIT/ML IV SOLN
500.0000 [IU] | Freq: Once | INTRAVENOUS | Status: AC | PRN
  Administered 2018-06-01: 500 [IU]
  Filled 2018-06-01: qty 5

## 2018-06-01 MED ORDER — PEGFILGRASTIM INJECTION 6 MG/0.6ML ~~LOC~~
6.0000 mg | PREFILLED_SYRINGE | Freq: Once | SUBCUTANEOUS | Status: AC
  Administered 2018-06-01: 6 mg via SUBCUTANEOUS
  Filled 2018-06-01: qty 0.6

## 2018-06-01 MED ORDER — SODIUM CHLORIDE 0.9% FLUSH
10.0000 mL | INTRAVENOUS | Status: DC | PRN
  Administered 2018-06-01: 10 mL
  Filled 2018-06-01: qty 10

## 2018-06-13 ENCOUNTER — Inpatient Hospital Stay (HOSPITAL_BASED_OUTPATIENT_CLINIC_OR_DEPARTMENT_OTHER): Payer: Managed Care, Other (non HMO) | Admitting: Oncology

## 2018-06-13 ENCOUNTER — Encounter: Payer: Self-pay | Admitting: Oncology

## 2018-06-13 ENCOUNTER — Inpatient Hospital Stay: Payer: Managed Care, Other (non HMO)

## 2018-06-13 VITALS — BP 165/95 | HR 90 | Temp 97.3°F | Resp 18 | Ht 67.0 in | Wt 213.2 lb

## 2018-06-13 DIAGNOSIS — T451X5A Adverse effect of antineoplastic and immunosuppressive drugs, initial encounter: Secondary | ICD-10-CM

## 2018-06-13 DIAGNOSIS — C187 Malignant neoplasm of sigmoid colon: Secondary | ICD-10-CM | POA: Diagnosis not present

## 2018-06-13 DIAGNOSIS — G620 Drug-induced polyneuropathy: Secondary | ICD-10-CM | POA: Diagnosis not present

## 2018-06-13 DIAGNOSIS — Z5111 Encounter for antineoplastic chemotherapy: Secondary | ICD-10-CM | POA: Diagnosis not present

## 2018-06-13 LAB — COMPREHENSIVE METABOLIC PANEL
ALBUMIN: 3.9 g/dL (ref 3.5–5.0)
ALK PHOS: 127 U/L — AB (ref 38–126)
ALT: 39 U/L (ref 0–44)
AST: 40 U/L (ref 15–41)
Anion gap: 8 (ref 5–15)
BUN: 10 mg/dL (ref 6–20)
CALCIUM: 9 mg/dL (ref 8.9–10.3)
CHLORIDE: 110 mmol/L (ref 98–111)
CO2: 23 mmol/L (ref 22–32)
CREATININE: 0.68 mg/dL (ref 0.44–1.00)
GFR calc Af Amer: >60 mL/min (ref >60)
GFR calc non Af Amer: >60 mL/min (ref >60)
GLUCOSE: 146 mg/dL — AB (ref 70–99)
Potassium: 3.3 mmol/L — ABNORMAL LOW (ref 3.5–5.1)
Sodium: 141 mmol/L (ref 135–145)
Total Bilirubin: 0.4 mg/dL (ref 0.3–1.2)
Total Protein: 7 g/dL (ref 6.5–8.1)

## 2018-06-13 LAB — CBC WITH DIFFERENTIAL/PLATELET
BASOS PCT: 1 %
Basophils Absolute: 0 10*3/uL (ref 0–0.1)
EOS ABS: 0.2 10*3/uL (ref 0–0.7)
EOS PCT: 4 %
HCT: 35.2 % (ref 35.0–47.0)
HEMOGLOBIN: 12.4 g/dL (ref 12.0–16.0)
Lymphocytes Relative: 35 %
Lymphs Abs: 2.1 10*3/uL (ref 1.0–3.6)
MCH: 35.1 pg — AB (ref 26.0–34.0)
MCHC: 35.3 g/dL (ref 32.0–36.0)
MCV: 99.4 fL (ref 80.0–100.0)
MONO ABS: 0.3 10*3/uL (ref 0.2–0.9)
MONOS PCT: 5 %
Neutro Abs: 3.3 10*3/uL (ref 1.4–6.5)
Neutrophils Relative %: 57 %
PLATELETS: 118 10*3/uL — AB (ref 150–440)
RBC: 3.54 MIL/uL — ABNORMAL LOW (ref 3.80–5.20)
RDW: 15.2 % — AB (ref 11.5–14.5)
WBC: 5.9 10*3/uL (ref 3.6–11.0)

## 2018-06-13 MED ORDER — SODIUM CHLORIDE 0.9% FLUSH
10.0000 mL | INTRAVENOUS | Status: AC | PRN
  Administered 2018-06-13: 10 mL via INTRAVENOUS
  Filled 2018-06-13: qty 10

## 2018-06-13 MED ORDER — HEPARIN SOD (PORK) LOCK FLUSH 100 UNIT/ML IV SOLN
500.0000 [IU] | Freq: Once | INTRAVENOUS | Status: AC
  Administered 2018-06-13: 500 [IU] via INTRAVENOUS
  Filled 2018-06-13: qty 5

## 2018-06-13 NOTE — Progress Notes (Signed)
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 Vicente Males from GI for recurrent episodes of diverticulitis sinceaugust2018 which was treated with antibiotics. However patient continued to have some abdominal cramping along with some bleeding in her stool and constipation alternating with diarrhea. Patient underwent flexible sigmoidoscopy on 11/07/2017. A circumferential infiltrative completely obstructing large mass was found in the rectosigmoid colon.Biopsy showed at least high-grade dysplasia. Patient was referred to Dr. Derinda Sis consideration of hemicolectomy.  Patient underwent laparoscopic low anterior resection along with small bowel resection and laparoscopic takedown of the splenic flexure on 11/11/2017. 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 Tumor size was 3 cm, moderately differentiated. All margins were negative for invasive carcinoma. Perineural invasion and lymphovascular invasion was present. 1 out of 17 lymph nodes was positive for malignancy. Pathologic stage was pT4b pN1a. MSI stable  Preoperative CEA is not available. CT abdomen on 10/26/2017 showed residual changes of diverticulitis although improved when compared to prior exam. No perforation or abscess is identified.  No family history of colon cancer. History of breast cancer in her mother in her 9s. Genetic testing was  negative   Interval history- reports that neuropathy still continues to bother her. She had pain and difficulty swallowing after her last chemo which lasted a little longer than a week. She is concerned about her weight gain and feels that her thyroid functions have been unstable since she started treatment. She does not wish to proceed with further chemotherapy at this time  ECOG PS- 0 Pain scale- 0 Opioid associated constipation- no  Review of systems- Review of Systems  Constitutional: Positive for malaise/fatigue. Negative for chills, fever and weight loss.  HENT: Negative for congestion, ear discharge and nosebleeds.   Eyes: Negative for blurred vision.  Respiratory: Negative for cough, hemoptysis, sputum production, shortness of breath and wheezing.   Cardiovascular: Negative for chest pain, palpitations, orthopnea and claudication.  Gastrointestinal: Negative for abdominal pain, blood in stool, constipation, diarrhea, heartburn, melena, nausea and vomiting.  Genitourinary: Negative for dysuria, flank pain, frequency, hematuria and urgency.  Musculoskeletal: Negative for back pain, joint pain and myalgias.  Skin: Negative for rash.  Neurological: Positive for tingling. Negative for dizziness, focal weakness, seizures, weakness and headaches.  Endo/Heme/Allergies: Does not bruise/bleed easily.  Psychiatric/Behavioral: Negative for depression and suicidal ideas. The patient does not have insomnia.       No Known Allergies   Past Medical History:  Diagnosis Date  . Anemia   . Cancer of sigmoid colon ([place]) 11/11/2017   Partial sigmoid colon resection.   . Diverticulosis   . Genetic testing 12/14/2017   Common Cancers panel (47 genes) @ [facility] - No pathogenic mutations detected  . Headache   . Heart murmur   . Hyperlipidemia   . Hypothyroidism   . Thyroid disease      Past Surgical History:  Procedure Laterality Date  . APPENDECTOMY     possible during hysterectomy   . BOWEL RESECTION N/A  11/11/2017   Procedure: SMALL BOWEL RESECTION;  Surgeon: Jules Husbands, MD;  Location: [facility] ORS;  Service: General;  Laterality: N/A;  . FLEXIBLE SIGMOIDOSCOPY N/A 11/07/2017   Procedure: Beryle Quant;  Surgeon: Jonathon Bellows, MD;  Location: [facility] ENDOSCOPY;  Service: Gastroenterology;  Laterality: N/A;  . LAPAROSCOPIC SIGMOID COLECTOMY N/A 11/11/2017   Procedure: LAPAROSCOPIC SIGMOID COLECTOMY;  Surgeon: Jules Husbands, MD;  Location: [facility] ORS;  Service: General;  Laterality: N/A;  . MOUTH SURGERY    . PORTACATH PLACEMENT Right 11/29/2017   Procedure: INSERTION PORT-A-CATH;  Surgeon: Jules Husbands, MD;  Location: [place];  Service: General;  Laterality: Right;  May do local and IV sedation. Please have U/S  . TOTAL ABDOMINAL HYSTERECTOMY  05/20/2014   ooperectomy unilateral    Social History   Socioeconomic History  . Marital status: Married    Spouse name: Not on file  . Number of children: Not on file  . Years of education: Not on file  . Highest education level: Not on file  Occupational History  . Not on file  Social Needs  . Financial resource strain: Not on file  . Food insecurity:    Worry: Not on file    Inability: Not on file  . Transportation needs:    Medical: Not on file    Non-medical: Not on file  Tobacco Use  . Smoking status: Never Smoker  . Smokeless tobacco: Never Used  Substance and Sexual Activity  . Alcohol use: No    Alcohol/week: 0.0 oz  . Drug use: No  . Sexual activity: Yes  Lifestyle  . Physical activity:    Days per week: Not on file    Minutes per session: Not on file  . Stress: Not on file  Relationships  . Social connections:    Talks on phone: Not on file    Gets together: Not on file    Attends religious service: Not on file    Active member of club or organization: Not on file    Attends meetings of clubs or organizations: Not on file    Relationship status: Not on file  . Intimate  partner violence:    Fear of current or ex partner: Not on file    Emotionally abused: Not on file    Physically abused: Not on file    Forced sexual activity: Not on file  Other Topics Concern  . Not on file  Social History Narrative  . Not on file    Family History  Problem Relation Age of Onset  . Breast cancer Mother 33       currently 89  . Hypertension Mother   . Thyroid disease Brother   . Hernia Brother   . Thyroid cancer Maternal Grandmother 60       deceased 49  . Stomach cancer Paternal Grandfather        unconfirmed; deceased 53  . Stomach cancer Paternal Aunt 69       deceased 28  . Colon cancer Maternal Aunt 68       also lung cancer  . Stomach cancer Maternal Uncle 73     Current Outpatient Medications:  .  APPLE CIDER VINEGAR PO, Take 1 tablet by mouth daily., Disp: , Rfl:  .  Biotin 1000 MCG tablet, Take 1,000 mcg by mouth daily., Disp: , Rfl:  .  Cyanocobalamin (VITAMIN B 12 PO), Take by mouth., Disp: , Rfl:  .  diphenhydrAMINE (BENADRYL) 25 MG  tablet, Take 25 mg by mouth every 6 (six) hours as needed for itching or allergies., Disp: , Rfl:  .  DULoxetine (CYMBALTA) 30 MG capsule, Take 1 capsule (30 mg total) by mouth daily. For 7 days and then bid for rest of the month, Disp: 59 capsule, Rfl: 0 .  levothyroxine (SYNTHROID, LEVOTHROID) 200 MCG tablet, Take 1 tablet (200 mcg total) by mouth daily before breakfast., Disp: 30 tablet, Rfl: 2 .  lidocaine-prilocaine (EMLA) cream, Apply to affected area once (Patient not taking: Reported on 05/30/2018), Disp: 30 g, Rfl: 3 .  loratadine (CLARITIN) 10 MG tablet, Take 10 mg by mouth daily., Disp: , Rfl:  .  LORazepam (ATIVAN) 0.5 MG tablet, Take 1 tablet (0.5 mg total) by mouth every 6 (six) hours as needed (Nausea or vomiting). (Patient not taking: Reported on 05/30/2018), Disp: 30 tablet, Rfl: 0 .  Multiple Vitamin tablet, Take 2 tablets by mouth daily. , Disp: , Rfl:  .  OMEGA-3 FATTY ACIDS PO, Take 2 capsules by  mouth daily. , Disp: , Rfl:  .  ondansetron (ZOFRAN) 8 MG tablet, Take 1 tablet (8 mg total) by mouth 2 (two) times daily as needed for refractory nausea / vomiting. Start on day 3 after chemotherapy. (Patient not taking: Reported on 05/30/2018), Disp: 30 tablet, Rfl: 1 .  prochlorperazine (COMPAZINE) 10 MG tablet, Take 1 tablet (10 mg total) by mouth every 6 (six) hours as needed (Nausea or vomiting). (Patient not taking: Reported on 05/30/2018), Disp: 30 tablet, Rfl: 1 .  VITAMIN D, CHOLECALCIFEROL, PO, Take 1 tablet by mouth daily., Disp: , Rfl:  No current facility-administered medications for this visit.   Facility-Administered Medications Ordered in Other Visits:  .  heparin lock flush 100 unit/mL, 500 Units, Intracatheter, PRN, Sindy Guadeloupe, MD  Physical exam:  Vitals:   06/13/18 0857  BP: (!) 165/95  Pulse: 90  Resp: 18  Temp: (!) 97.3 F (36.3 C)  TempSrc: Tympanic  Weight: 213 lb 3.2 oz (96.7 kg)  Height: 5' 7"  (1.702 m)   Physical Exam  Constitutional: She is oriented to person, place, and time. She appears well-developed and well-nourished.  HENT:  Head: Normocephalic and atraumatic.  Eyes: Pupils are equal, round, and reactive to light. EOM are normal.  Neck: Normal range of motion.  Cardiovascular: Normal rate, regular rhythm and normal heart sounds.  Pulmonary/Chest: Effort normal and breath sounds normal.  Abdominal: Soft. Bowel sounds are normal.  Neurological: She is alert and oriented to person, place, and time.  Skin: Skin is warm and dry.     CMP Latest Ref Rng & Units 06/13/2018  Glucose 70 - 99 mg/dL 146(H)  BUN 6 - 20 mg/dL 10  Creatinine 0.44 - 1.00 mg/dL 0.68  Sodium 135 - 145 mmol/L 141  Potassium 3.5 - 5.1 mmol/L 3.3(L)  Chloride 98 - 111 mmol/L 110  CO2 22 - 32 mmol/L 23  Calcium 8.9 - 10.3 mg/dL 9.0  Total Protein 6.5 - 8.1 g/dL 7.0  Total Bilirubin 0.3 - 1.2 mg/dL 0.4  Alkaline Phos 38 - 126 U/L 127(H)  AST 15 - 41 U/L 40  ALT 0 - 44 U/L 39    CBC Latest Ref Rng & Units 06/13/2018  WBC 3.6 - 11.0 K/uL 5.9  Hemoglobin 12.0 - 16.0 g/dL 12.4  Hematocrit 35.0 - 47.0 % 35.2  Platelets 150 - 440 K/uL 118(L)     Assessment and plan- Patient is a 47 y.o. female with invasive mucinous carcinoma  of the sigmoid colon stage IIICpT4 pN1 cM0status post resectionhere for on treatment assessment prior to cycle 11 of 5FU LV chemotherapy  oxaliplatin stopped after 9 cycles due to neuropathy.  Patient does not wish to proceed with 2 more cycles of chemotherapy given the side effects mentioned above.  We will therefore stop all her adjuvant chemotherapy at this time.  I did give her the option of eliminating the 5-FU bolus and trying infusional 5-FU only but she does not wish to continue with any more chemotherapy.  I will plan to get repeat CT chest abdomen and pelvis in the next 2 weeks and I will see her back in 3 weeks.  She will continue to have a port in place and I will plan to get her port out after getting her next set of scan in 6 months.  Chemotherapy-induced peripheral neuropathy: Patient has not yet picked up her Cymbalta and we will go ahead and re-prescribe Cymbalta 30 mg a day for a week followed by increasing it to 60 mg a day and see if it helps.  Patient also needs a completion colonoscopy at this time and I will touch base with Dr. Vicente Males about this as well  Mild hypokalemia: patient will try increasing intake of potassium rich foods such as bananas. Repeat bmp in 3 weeks     Visit Diagnosis 1. Cancer of sigmoid ([place])   2. Chemotherapy-induced peripheral neuropathy ([place])      Dr. Randa Evens, MD, MPH [facility] at [facility] [phone] 06/13/2018 8:19 AM

## 2018-06-13 NOTE — Progress Notes (Signed)
No new changes nnoted today

## 2018-06-21 ENCOUNTER — Telehealth: Payer: Self-pay | Admitting: Oncology

## 2018-06-21 NOTE — Telephone Encounter (Signed)
Called patient and left msg regarding CT rschd for 06/29/18 @ [facility], per pending approval from [employer], per [employer]. L/M on V/M.  Dr. Janese Banks and team notified also.

## 2018-06-23 ENCOUNTER — Ambulatory Visit: Payer: Managed Care, Other (non HMO)

## 2018-06-29 ENCOUNTER — Ambulatory Visit
Admission: RE | Admit: 2018-06-29 | Discharge: 2018-06-29 | Disposition: A | Discharge status: Home / Self Care | Payer: Managed Care, Other (non HMO) | Source: Ambulatory Visit | Attending: Oncology | Admitting: Oncology

## 2018-06-29 ENCOUNTER — Telehealth: Payer: Self-pay

## 2018-06-29 DIAGNOSIS — I879 Disorder of vein, unspecified: Secondary | ICD-10-CM | POA: Diagnosis not present

## 2018-06-29 DIAGNOSIS — I82629 Acute embolism and thrombosis of deep veins of unspecified upper extremity: Secondary | ICD-10-CM

## 2018-06-29 DIAGNOSIS — C187 Malignant neoplasm of sigmoid colon: Secondary | ICD-10-CM | POA: Diagnosis not present

## 2018-06-29 MED ORDER — APIXABAN 5 MG PO TABS: 5.0000 mg | ORAL_TABLET | Freq: Two times a day (BID) | ORAL | 0 refills | Status: DC

## 2018-06-29 MED ORDER — IOPAMIDOL (ISOVUE-300) INJECTION 61%
100.0000 mL | Freq: Once | INTRAVENOUS | Status: AC | PRN
  Administered 2018-06-29: 100 mL via INTRAVENOUS

## 2018-06-29 MED ORDER — APIXABAN 5 MG PO TABS: 10.0000 mg | ORAL_TABLET | Freq: Two times a day (BID) | ORAL | 0 refills | Status: DC

## 2018-06-29 NOTE — Telephone Encounter (Signed)
I left a message on the patient voice mail she has a DVT present to her upper ext. and we will need to start Eliquis 10 mg BID x 7 days and then 5 mg BID until further notice. The medication has been sent to the pharmacy today.

## 2018-06-30 ENCOUNTER — Telehealth: Payer: Self-pay

## 2018-06-30 NOTE — Telephone Encounter (Signed)
Spoke with the patient to infrom her that she was positive for a DVT in her upper ext. and i have e prescribe Eliquis and it will be at the pharmacy. The patient was agreeable and understanding to get the medication from the pharmacy today.

## 2018-07-04 ENCOUNTER — Inpatient Hospital Stay (HOSPITAL_BASED_OUTPATIENT_CLINIC_OR_DEPARTMENT_OTHER): Payer: Managed Care, Other (non HMO) | Admitting: Oncology

## 2018-07-04 ENCOUNTER — Inpatient Hospital Stay: Payer: Managed Care, Other (non HMO) | Attending: Oncology

## 2018-07-04 ENCOUNTER — Other Ambulatory Visit: Payer: Self-pay

## 2018-07-04 VITALS — BP 163/96 | HR 91 | Temp 96.6°F | Resp 18 | Wt 212.2 lb

## 2018-07-04 DIAGNOSIS — Z85038 Personal history of other malignant neoplasm of large intestine: Secondary | ICD-10-CM

## 2018-07-04 DIAGNOSIS — C187 Malignant neoplasm of sigmoid colon: Secondary | ICD-10-CM | POA: Insufficient documentation

## 2018-07-04 DIAGNOSIS — I82429 Acute embolism and thrombosis of unspecified iliac vein: Secondary | ICD-10-CM | POA: Diagnosis not present

## 2018-07-04 DIAGNOSIS — G620 Drug-induced polyneuropathy: Secondary | ICD-10-CM | POA: Diagnosis not present

## 2018-07-04 DIAGNOSIS — T451X5A Adverse effect of antineoplastic and immunosuppressive drugs, initial encounter: Secondary | ICD-10-CM

## 2018-07-04 DIAGNOSIS — Z08 Encounter for follow-up examination after completed treatment for malignant neoplasm: Secondary | ICD-10-CM

## 2018-07-04 LAB — COMPREHENSIVE METABOLIC PANEL WITH GFR
ALT: 35 U/L (ref 0–44)
AST: 30 U/L (ref 15–41)
Albumin: 3.8 g/dL (ref 3.5–5.0)
Alkaline Phosphatase: 95 U/L (ref 38–126)
Anion gap: 7 (ref 5–15)
BUN: 9 mg/dL (ref 6–20)
CO2: 22 mmol/L (ref 22–32)
Calcium: 9 mg/dL (ref 8.9–10.3)
Chloride: 107 mmol/L (ref 98–111)
Creatinine, Ser: 0.5 mg/dL (ref 0.44–1.00)
GFR calc Af Amer: >60 mL/min
GFR calc non Af Amer: >60 mL/min
Glucose, Bld: 115 mg/dL — ABNORMAL HIGH (ref 70–99)
Potassium: 3.6 mmol/L (ref 3.5–5.1)
Sodium: 136 mmol/L (ref 135–145)
Total Bilirubin: 0.5 mg/dL (ref 0.3–1.2)
Total Protein: 7.1 g/dL (ref 6.5–8.1)

## 2018-07-04 LAB — CBC WITH DIFFERENTIAL/PLATELET
BASOS ABS: 0 10*3/uL (ref 0–0.1)
Basophils Relative: 0 %
EOS ABS: 0.1 10*3/uL (ref 0–0.7)
EOS PCT: 1 %
HCT: 36.6 % (ref 35.0–47.0)
HEMOGLOBIN: 12.7 g/dL (ref 12.0–16.0)
LYMPHS PCT: 30 %
Lymphs Abs: 1.8 10*3/uL (ref 1.0–3.6)
MCH: 33.4 pg (ref 26.0–34.0)
MCHC: 34.7 g/dL (ref 32.0–36.0)
MCV: 96.2 fL (ref 80.0–100.0)
Monocytes Absolute: 0.4 10*3/uL (ref 0.2–0.9)
Monocytes Relative: 7 %
NEUTROS PCT: 62 %
Neutro Abs: 3.8 10*3/uL (ref 1.4–6.5)
Platelets: 205 10*3/uL (ref 150–440)
RBC: 3.8 MIL/uL (ref 3.80–5.20)
RDW: 13.6 % (ref 11.5–14.5)
WBC: 6.1 10*3/uL (ref 3.6–11.0)

## 2018-07-04 MED ORDER — HEPARIN SOD (PORK) LOCK FLUSH 100 UNIT/ML IV SOLN
500.0000 [IU] | Freq: Once | INTRAVENOUS | Status: AC
  Administered 2018-07-04: 500 [IU] via INTRAVENOUS

## 2018-07-04 MED ORDER — SODIUM CHLORIDE 0.9% FLUSH
10.0000 mL | Freq: Once | INTRAVENOUS | Status: AC
  Administered 2018-07-04: 10 mL via INTRAVENOUS
  Filled 2018-07-04: qty 10

## 2018-07-04 NOTE — Progress Notes (Signed)
Here for follow up. Overall stated " doing well " except has numbness and tingling in finger tips and feet . Stated she has not noticed diff w neuropathy w taking Cymbalta she stated.

## 2018-07-06 ENCOUNTER — Encounter: Payer: Self-pay | Admitting: Oncology

## 2018-07-06 NOTE — Progress Notes (Signed)
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 Vicente Males from GI for recurrent episodes of diverticulitis sinceaugust2018 which was treated with antibiotics. However patient continued to have some abdominal cramping along with some bleeding in her stool and constipation alternating with diarrhea. Patient underwent flexible sigmoidoscopy on 11/07/2017. A circumferential infiltrative completely obstructing large mass was found in the rectosigmoid colon.Biopsy showed at least high-grade dysplasia. Patient was referred to Dr. Derinda Sis consideration of hemicolectomy.  Patient underwent laparoscopic low anterior resection along with small bowel resection and laparoscopic takedown of the splenic flexure on 11/11/2017. 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 Tumor size was 3 cm, moderately differentiated. All margins were negative for invasive carcinoma. Perineural invasion and lymphovascular invasion was present. 1 out of 17 lymph nodes was positive for malignancy. Pathologic stage was pT4b pN1a. MSI stable  Preoperative CEA is not available. CT abdomen on 10/26/2017 showed residual changes of diverticulitis although improved when compared to prior exam. No perforation or abscess is identified.  No family history of colon cancer. History of breast cancer in her mother in her 54s. Genetic testing was negative  Patient completed 10 cycles of FOLFOX  chemotherapy in June 2019. oxaliplatin stopped after 9 cycles. She did not wish to complete 12 cycles due to neuropathy and fatigue    Interval history- she feels better since stopping chemo. Energy levels are better. Neuropathy is stable but has not improved significantly either. It is mild with persistent numbness in her fingertips but it does not prevent her from carrying out her ADL's  ECOG PS- 0 Pain scale- 0 Opioid associated constipation- no  Review of systems- Review of Systems  Constitutional: Negative for chills, fever, malaise/fatigue and weight loss.  HENT: Negative for congestion, ear discharge and nosebleeds.   Eyes: Negative for blurred vision.  Respiratory: Negative for cough, hemoptysis, sputum production, shortness of breath and wheezing.   Cardiovascular: Negative for chest pain, palpitations, orthopnea and claudication.  Gastrointestinal: Negative for abdominal pain, blood in stool, constipation, diarrhea, heartburn, melena, nausea and vomiting.  Genitourinary: Negative for dysuria, flank pain, frequency, hematuria and urgency.  Musculoskeletal: Negative for back pain, joint pain and myalgias.  Skin: Negative for rash.  Neurological: Positive for tingling. Negative for dizziness, focal weakness, seizures, weakness and headaches.  Endo/Heme/Allergies: Does not bruise/bleed easily.  Psychiatric/Behavioral: Negative for depression and suicidal ideas. The patient does not have insomnia.       No Known Allergies   Past Medical History:  Diagnosis Date  . Anemia   . Cancer of sigmoid colon ([place]) 11/11/2017   Partial sigmoid colon resection.   . Diverticulosis   . Genetic testing 12/14/2017   Common Cancers panel (47 genes) @ [facility] - No pathogenic mutations detected  . Headache   . Heart murmur   . Hyperlipidemia   . Hypothyroidism   . Thyroid disease      Past Surgical History:  Procedure Laterality Date  . APPENDECTOMY     possible during hysterectomy    . BOWEL RESECTION  N/A 11/11/2017   Procedure: SMALL BOWEL RESECTION;  Surgeon: Jules Husbands, MD;  Location: [facility] ORS;  Service: General;  Laterality: N/A;  . FLEXIBLE SIGMOIDOSCOPY N/A 11/07/2017   Procedure: Beryle Quant;  Surgeon: Jonathon Bellows, MD;  Location: [facility] ENDOSCOPY;  Service: Gastroenterology;  Laterality: N/A;  . LAPAROSCOPIC SIGMOID COLECTOMY N/A 11/11/2017   Procedure: LAPAROSCOPIC SIGMOID COLECTOMY;  Surgeon: Jules Husbands, MD;  Location: [facility] ORS;  Service: General;  Laterality: N/A;  . MOUTH SURGERY    . PORTACATH PLACEMENT Right 11/29/2017   Procedure: INSERTION PORT-A-CATH;  Surgeon: Jules Husbands, MD;  Location: [place];  Service: General;  Laterality: Right;  May do local and IV sedation. Please have U/S  . TOTAL ABDOMINAL HYSTERECTOMY  05/20/2014   ooperectomy unilateral    Social History   Socioeconomic History  . Marital status: Married    Spouse name: Not on file  . Number of children: Not on file  . Years of education: Not on file  . Highest education level: Not on file  Occupational History  . Not on file  Social Needs  . Financial resource strain: Not on file  . Food insecurity:    Worry: Not on file    Inability: Not on file  . Transportation needs:    Medical: Not on file    Non-medical: Not on file  Tobacco Use  . Smoking status: Never Smoker  . Smokeless tobacco: Never Used  Substance and Sexual Activity  . Alcohol use: No    Alcohol/week: 0.0 oz  . Drug use: No  . Sexual activity: Yes  Lifestyle  . Physical activity:    Days per week: Not on file    Minutes per session: Not on file  . Stress: Not on file  Relationships  . Social connections:    Talks on phone: Not on file    Gets together: Not on file    Attends religious service: Not on file    Active member of club or organization: Not on file    Attends meetings of clubs or organizations: Not on file    Relationship status: Not on file  . Intimate  partner violence:    Fear of current or ex partner: Not on file    Emotionally abused: Not on file    Physically abused: Not on file    Forced sexual activity: Not on file  Other Topics Concern  . Not on file  Social History Narrative  . Not on file    Family History  Problem Relation Age of Onset  . Breast cancer Mother 68       currently 39  . Hypertension Mother   . Thyroid disease Brother   . Hernia Brother   . Thyroid cancer Maternal Grandmother 79       deceased 44  . Stomach cancer Paternal Grandfather        unconfirmed; deceased 54  . Stomach cancer Paternal Aunt 17       deceased 24  . Colon cancer Maternal Aunt 68       also lung cancer  . Stomach cancer Maternal Uncle 77     Current Outpatient Medications:  .  apixaban (ELIQUIS) 5 MG TABS tablet, Take 1 tablet (5 mg total) by mouth 2 (two) times daily., Disp: 60 tablet, Rfl: 0 .  APPLE CIDER VINEGAR PO, Take 1 tablet by mouth daily., Disp: , Rfl:  .  Biotin 1000 MCG tablet, Take 1,000 mcg by  mouth daily., Disp: , Rfl:  .  Cyanocobalamin (VITAMIN B 12 PO), Take by mouth., Disp: , Rfl:  .  DULoxetine (CYMBALTA) 30 MG capsule, Take 1 capsule (30 mg total) by mouth daily. For 7 days and then bid for rest of the month (Patient taking differently: Take 30 mg by mouth 2 (two) times daily. For 7 days and then bid for rest of the month), Disp: 59 capsule, Rfl: 0 .  levothyroxine (SYNTHROID, LEVOTHROID) 200 MCG tablet, Take 1 tablet (200 mcg total) by mouth daily before breakfast., Disp: 30 tablet, Rfl: 2 .  lidocaine-prilocaine (EMLA) cream, Apply to affected area once, Disp: 30 g, Rfl: 3 .  Multiple Vitamin tablet, Take 2 tablets by mouth daily. , Disp: , Rfl:  .  OMEGA-3 FATTY ACIDS PO, Take 2 capsules by mouth daily. , Disp: , Rfl:  .  VITAMIN D, CHOLECALCIFEROL, PO, Take 1 tablet by mouth daily., Disp: , Rfl:  .  diphenhydrAMINE (BENADRYL) 25 MG tablet, Take 25 mg by mouth every 6 (six) hours as needed for itching or  allergies., Disp: , Rfl:  .  loratadine (CLARITIN) 10 MG tablet, Take 10 mg by mouth daily., Disp: , Rfl:  .  LORazepam (ATIVAN) 0.5 MG tablet, Take 1 tablet (0.5 mg total) by mouth every 6 (six) hours as needed (Nausea or vomiting). (Patient not taking: Reported on 05/30/2018), Disp: 30 tablet, Rfl: 0 .  ondansetron (ZOFRAN) 8 MG tablet, Take 1 tablet (8 mg total) by mouth 2 (two) times daily as needed for refractory nausea / vomiting. Start on day 3 after chemotherapy. (Patient not taking: Reported on 05/30/2018), Disp: 30 tablet, Rfl: 1 .  prochlorperazine (COMPAZINE) 10 MG tablet, Take 1 tablet (10 mg total) by mouth every 6 (six) hours as needed (Nausea or vomiting). (Patient not taking: Reported on 05/30/2018), Disp: 30 tablet, Rfl: 1 No current facility-administered medications for this visit.   Facility-Administered Medications Ordered in Other Visits:  .  heparin lock flush 100 unit/mL, 500 Units, Intracatheter, PRN, Sindy Guadeloupe, MD .  sodium chloride flush (NS) 0.9 % injection 10 mL, 10 mL, Intravenous, PRN, Sindy Guadeloupe, MD, 10 mL at 06/13/18 0844  Physical exam:  Vitals:   07/04/18 1452  BP: (!) 163/96  Pulse: 91  Resp: 18  Temp: (!) 96.6 F (35.9 C)  TempSrc: Tympanic  Weight: 212 lb 3.2 oz (96.3 kg)   Physical Exam  Constitutional: She is oriented to person, place, and time. She appears well-developed and well-nourished.  HENT:  Head: Normocephalic and atraumatic.  Eyes: Pupils are equal, round, and reactive to light. EOM are normal.  Neck: Normal range of motion.  Cardiovascular: Normal rate, regular rhythm and normal heart sounds.  Pulmonary/Chest: Effort normal and breath sounds normal.  Abdominal: Soft. Bowel sounds are normal.  Neurological: She is alert and oriented to person, place, and time.  Skin: Skin is warm and dry.     CMP Latest Ref Rng & Units 07/04/2018  Glucose 70 - 99 mg/dL 115(H)  BUN 6 - 20 mg/dL 9  Creatinine 0.44 - 1.00 mg/dL 0.50  Sodium  135 - 145 mmol/L 136  Potassium 3.5 - 5.1 mmol/L 3.6  Chloride 98 - 111 mmol/L 107  CO2 22 - 32 mmol/L 22  Calcium 8.9 - 10.3 mg/dL 9.0  Total Protein 6.5 - 8.1 g/dL 7.1  Total Bilirubin 0.3 - 1.2 mg/dL 0.5  Alkaline Phos 38 - 126 U/L 95  AST 15 - 41 U/L 30  ALT 0 - 44 U/L 35   CBC Latest Ref Rng & Units 07/04/2018  WBC 3.6 - 11.0 K/uL 6.1  Hemoglobin 12.0 - 16.0 g/dL 12.7  Hematocrit 35.0 - 47.0 % 36.6  Platelets 150 - 440 K/uL 205    No images are attached to the encounter.  Ct Chest W Contrast  Result Date: 06/29/2018 CLINICAL DATA:  Sigmoid colon carcinoma. Status post sigmoid resection and chemotherapy EXAM: CT CHEST, ABDOMEN, AND PELVIS WITH CONTRAST TECHNIQUE: Multidetector CT imaging of the chest, abdomen and pelvis was performed following the standard protocol during bolus administration of intravenous contrast. CONTRAST EXTRAVASATION CONSULTATION: Type of contrast:  Isovue 300 Site of extravasation: LEFT antecubital fossa Estimated volume of extravasation: 80 ml Area of extravasation scanned with CT? no PATIENT'S SIGNS AND SYMPTOMS Skin blistering/ulceration: no Decrease capillary refill: no Change in skin color: no Decreased motor function or severe tightness: no Decreased pulses distal to site of extravasation: no Altered sensation: no Increasing pain or signs of increased swelling during observation: Decreasing swelling and pain. TREATMENT Observation period at site: 30 minutes Limb elevation: yes Ice packs applied: yes Heat pads applied: No Plastic surgery consulted? no DOCUMENTATION AND FOLLOW-UP Site contrast extravasation forms submitted? yes Post extravasation orders completed? No outpatient.) Instructions given to patient Was additional follow up assigned to PA's? no Patient's questions answered? yes Patient instructed to call [phone] or seek immediate medical care if symptoms progress. CONTRAST:  135m ISOVUE-300 IOPAMIDOL (ISOVUE-300) INJECTION 61%, 100 additional cc  Isovue per extravasation above COMPARISON:  CT 10/26/2017 FINDINGS: CT CHEST FINDINGS Cardiovascular: No filling defects within the pulmonary arteries to suggest central pulmonary embolism. No significant vascular findings. Normal heart size. No pericardial effusion. Mediastinum/Nodes: Post hysterectomy anatomy. Cystic change in the LEFT ovary is stable over multiple comparison exams (image 98/3) Lungs/Pleura: No suspicious pulmonary nodules. Musculoskeletal: No aggressive osseous lesion CT ABDOMEN PELVIS FINDINGS Hepatobiliary: No focal hepatic lesion. No biliary duct dilatation. Gallbladder is normal. Common bile duct is normal. Pancreas: Pancreas is normal. No ductal dilatation. No pancreatic inflammation. Spleen: Normal spleen Adrenals/urinary tract: Adrenal glands and kidneys are normal. The ureters and bladder normal. Stomach/Bowel: Stomach, small-bowel cecum normal. Appendix not identified. The ascending transverse and descending colon normal. Moderate volume stool. There is surgical anastomosis in the mid sigmoid colon without evidence obstruction or mass. Rectum normal. Vascular/Lymphatic: Abdominal aorta is normal caliber with atherosclerotic calcification. There is no retroperitoneal or periportal lymphadenopathy. Abdominal aorta is normal caliber. There is no retroperitoneal or periportal lymphadenopathy. No pelvic lymphadenopathy. There is a filling defect within the RIGHT common iliac vessel which is central in tubular (image 90/3) this findings most consistent with a venous thrombosis this thrombosis is nonocclusive but does occupy approximately half the lumen of the vessel. Reproductive: Cystic change of the LEFT ovary is stable over multiple comparison exams (image 99/3) Other: No peritoneal metastasis. Musculoskeletal: No aggressive osseous lesion. IMPRESSION: Chest Impression: 1. No evidence thoracic metastasis. 2. No evidence of central pulmonary embolism. Abdomen / Pelvis Impression: 1. No  evidence of local colorectal carcinoma recurrence sigmoid colon. 2. No evidence metastatic disease in the abdomen pelvis. 3. Nonocclusive tubular filling defect within the RIGHT common iliac vein is most consistent with a thromboembolism. Findings conveyed toARCHANA RAO on 06/29/2018  at09:43. Electronically Signed   By: SSuzy BouchardM.D.   On: 06/29/2018 09:47   Ct Abdomen Pelvis W Contrast  Result Date: 06/29/2018 CLINICAL DATA:  Sigmoid colon carcinoma. Status post sigmoid resection and chemotherapy EXAM: CT CHEST,  ABDOMEN, AND PELVIS WITH CONTRAST TECHNIQUE: Multidetector CT imaging of the chest, abdomen and pelvis was performed following the standard protocol during bolus administration of intravenous contrast. CONTRAST EXTRAVASATION CONSULTATION: Type of contrast:  Isovue 300 Site of extravasation: LEFT antecubital fossa Estimated volume of extravasation: 80 ml Area of extravasation scanned with CT? no PATIENT'S SIGNS AND SYMPTOMS Skin blistering/ulceration: no Decrease capillary refill: no Change in skin color: no Decreased motor function or severe tightness: no Decreased pulses distal to site of extravasation: no Altered sensation: no Increasing pain or signs of increased swelling during observation: Decreasing swelling and pain. TREATMENT Observation period at site: 30 minutes Limb elevation: yes Ice packs applied: yes Heat pads applied: No Plastic surgery consulted? no DOCUMENTATION AND FOLLOW-UP Site contrast extravasation forms submitted? yes Post extravasation orders completed? No outpatient.) Instructions given to patient Was additional follow up assigned to PA's? no Patient's questions answered? yes Patient instructed to call [phone] or seek immediate medical care if symptoms progress. CONTRAST:  156m ISOVUE-300 IOPAMIDOL (ISOVUE-300) INJECTION 61%, 100 additional cc Isovue per extravasation above COMPARISON:  CT 10/26/2017 FINDINGS: CT CHEST FINDINGS Cardiovascular: No filling defects  within the pulmonary arteries to suggest central pulmonary embolism. No significant vascular findings. Normal heart size. No pericardial effusion. Mediastinum/Nodes: Post hysterectomy anatomy. Cystic change in the LEFT ovary is stable over multiple comparison exams (image 98/3) Lungs/Pleura: No suspicious pulmonary nodules. Musculoskeletal: No aggressive osseous lesion CT ABDOMEN PELVIS FINDINGS Hepatobiliary: No focal hepatic lesion. No biliary duct dilatation. Gallbladder is normal. Common bile duct is normal. Pancreas: Pancreas is normal. No ductal dilatation. No pancreatic inflammation. Spleen: Normal spleen Adrenals/urinary tract: Adrenal glands and kidneys are normal. The ureters and bladder normal. Stomach/Bowel: Stomach, small-bowel cecum normal. Appendix not identified. The ascending transverse and descending colon normal. Moderate volume stool. There is surgical anastomosis in the mid sigmoid colon without evidence obstruction or mass. Rectum normal. Vascular/Lymphatic: Abdominal aorta is normal caliber with atherosclerotic calcification. There is no retroperitoneal or periportal lymphadenopathy. Abdominal aorta is normal caliber. There is no retroperitoneal or periportal lymphadenopathy. No pelvic lymphadenopathy. There is a filling defect within the RIGHT common iliac vessel which is central in tubular (image 90/3) this findings most consistent with a venous thrombosis this thrombosis is nonocclusive but does occupy approximately half the lumen of the vessel. Reproductive: Cystic change of the LEFT ovary is stable over multiple comparison exams (image 99/3) Other: No peritoneal metastasis. Musculoskeletal: No aggressive osseous lesion. IMPRESSION: Chest Impression: 1. No evidence thoracic metastasis. 2. No evidence of central pulmonary embolism. Abdomen / Pelvis Impression: 1. No evidence of local colorectal carcinoma recurrence sigmoid colon. 2. No evidence metastatic disease in the abdomen pelvis. 3.  Nonocclusive tubular filling defect within the RIGHT common iliac vein is most consistent with a thromboembolism. Findings conveyed toARCHANA RAO on 06/29/2018  at09:43. Electronically Signed   By: SSuzy BouchardM.D.   On: 06/29/2018 09:47     Assessment and plan- Patient is a 47y.o. female female with invasive mucinous carcinoma of the sigmoid colon stage IIICpT4 pN1 cM0status post resections/p 10 cycles of adjuvant chemotherapy  I have reviewed Ct chest abdomen pelvis images independently and discussed findings with the patient.  1. With regards to her colon cancer- no evidence of recurrent disease. I discussed her case with Dr. CBaruch Goutyover the phone if there would be a role for adjuavnt RT given T4b disease. He did think that was a consideration. I will therefore refer her to Rad onc to discuss adjuvant RT  at this time  Patient also needs completion colonoscopy the timing of which will be dependent on radiation. I will cc Dr. Vicente Males on this note. She can proceed with colonoscopy if she does not go for RT but it will need to wait if she does go for Rt. 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 Ct scans every 6 months. Port flushes every 6 weeks. 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 Unclear when she had it - post surgery or during chemo. Will plan to anticoagulate with eliquis for 3 months. Discussed natural history of DVt- indications- benefits and risks of anticoagulation. Patient understands and agrees to take as prescribed.   3. 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 Continue cymbalta.     Visit Diagnosis 1. Cancer of sigmoid ([place])   2. Encounter for follow-up surveillance of colon cancer   3. Iliac vein thrombosis, unspecified laterality ([place])      Dr. Randa Evens, MD, MPH [facility] [phone] 07/06/2018 8:17 AM

## 2018-07-11 ENCOUNTER — Ambulatory Visit: Payer: Managed Care, Other (non HMO) | Admitting: Radiation Oncology

## 2018-07-14 ENCOUNTER — Telehealth: Payer: Self-pay | Admitting: *Deleted

## 2018-07-14 NOTE — Telephone Encounter (Signed)
I called pt to see how appt went with Dr. Baruch Gouty and she states that she was suppose to get a call about her appt with him and she never got a call so she has not been there. I told her it looked like there was one on 7/23 and she did not know about it per pt. I have sent a message to radiation nurses and they will call her next week and I will f/u to make sure It is done. Pt agreeable to the plan

## 2018-07-20 ENCOUNTER — Other Ambulatory Visit: Payer: Self-pay

## 2018-07-20 ENCOUNTER — Encounter: Payer: Self-pay | Admitting: Radiation Oncology

## 2018-07-20 ENCOUNTER — Ambulatory Visit
Admission: RE | Admit: 2018-07-20 | Discharge: 2018-07-20 | Disposition: A | Discharge status: Home / Self Care | Payer: Managed Care, Other (non HMO) | Source: Ambulatory Visit | Attending: Radiation Oncology | Admitting: Radiation Oncology

## 2018-07-20 VITALS — BP 169/94 | HR 91 | Temp 98.0°F | Wt 212.1 lb

## 2018-07-20 DIAGNOSIS — Z8719 Personal history of other diseases of the digestive system: Secondary | ICD-10-CM | POA: Insufficient documentation

## 2018-07-20 DIAGNOSIS — Z79899 Other long term (current) drug therapy: Secondary | ICD-10-CM | POA: Insufficient documentation

## 2018-07-20 DIAGNOSIS — Z808 Family history of malignant neoplasm of other organs or systems: Secondary | ICD-10-CM | POA: Diagnosis not present

## 2018-07-20 DIAGNOSIS — G629 Polyneuropathy, unspecified: Secondary | ICD-10-CM | POA: Insufficient documentation

## 2018-07-20 DIAGNOSIS — C779 Secondary and unspecified malignant neoplasm of lymph node, unspecified: Secondary | ICD-10-CM | POA: Diagnosis not present

## 2018-07-20 DIAGNOSIS — Z803 Family history of malignant neoplasm of breast: Secondary | ICD-10-CM | POA: Insufficient documentation

## 2018-07-20 DIAGNOSIS — D649 Anemia, unspecified: Secondary | ICD-10-CM | POA: Insufficient documentation

## 2018-07-20 DIAGNOSIS — E039 Hypothyroidism, unspecified: Secondary | ICD-10-CM | POA: Insufficient documentation

## 2018-07-20 DIAGNOSIS — Z800 Family history of malignant neoplasm of digestive organs: Secondary | ICD-10-CM | POA: Diagnosis not present

## 2018-07-20 DIAGNOSIS — E785 Hyperlipidemia, unspecified: Secondary | ICD-10-CM | POA: Insufficient documentation

## 2018-07-20 DIAGNOSIS — R011 Cardiac murmur, unspecified: Secondary | ICD-10-CM | POA: Insufficient documentation

## 2018-07-20 DIAGNOSIS — C187 Malignant neoplasm of sigmoid colon: Secondary | ICD-10-CM | POA: Diagnosis not present

## 2018-07-20 DIAGNOSIS — Z7901 Long term (current) use of anticoagulants: Secondary | ICD-10-CM | POA: Insufficient documentation

## 2018-07-20 DIAGNOSIS — Z9221 Personal history of antineoplastic chemotherapy: Secondary | ICD-10-CM | POA: Diagnosis not present

## 2018-07-20 NOTE — Consult Note (Signed)
NEW PATIENT EVALUATION  Name: Regina Patel  MRN: [id number]  Date:   07/20/2018     DOB: 1971/05/16   This 47 y.o. 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  REFERRING PHYSICIAN: Mikey College, *  CHIEF COMPLAINT:  Chief Complaint  Patient presents with  . Cancer    follow up    DIAGNOSIS: The encounter diagnosis was Cancer of sigmoid ([place]).   PREVIOUS INVESTIGATIONS:  CT scans reviewed Pathology reports reviewed Clinical notes reviewed  HPI: patient is a 47 year old female who presented with abdominal complaints initially treated for diverticulitis with antibiotic therapy. She developed bleeding in her stools as well as alternating constipation and diarrhea and underwent flexible sig on 11/07/2017. That time no specific differential and platelets obstructing mass in the rectosigmoid colon with biopsy positive for high-grade dysplasia. Patient underwentlaparoscopic low anterior resection with small bowel resection. Pathology showed invasive mucinous adenocarcinoma with invasion into the small intestine. Tumor was 3 cm.and invaded the small bowel. There was lymphovascular invasion perineural invasion as well as 1 positive lymph node.She's gone on to have 10 of 12 cycles of FOLFOX chemotherapy although developed neuropathy and last 2 cycles were declined. She seen today and doing well she states her bowel function is good by mouth intake is good she is otherwise without complaint. She seen today for consideration of adjuvant radiation therapy.  PLANNED TREATMENT REGIMEN: adjuvant whole pelvic radiation  PAST MEDICAL HISTORY:  has a past medical history of Anemia, Cancer of sigmoid colon ([place]) (11/11/2017), Diverticulosis, Genetic testing (12/14/2017), Headache, Heart murmur, Hyperlipidemia, Hypothyroidism, and Thyroid disease.     PAST SURGICAL HISTORY:  Past Surgical History:  Procedure Laterality Date  . APPENDECTOMY     possible during hysterectomy  . BOWEL RESECTION N/A 11/11/2017   Procedure: SMALL BOWEL RESECTION;  Surgeon: Jules Husbands, MD;  Location: [facility] ORS;  Service: General;  Laterality: N/A;  . FLEXIBLE SIGMOIDOSCOPY N/A 11/07/2017   Procedure: Beryle Quant;  Surgeon: Jonathon Bellows, MD;  Location: [facility] ENDOSCOPY;  Service: Gastroenterology;  Laterality: N/A;  . LAPAROSCOPIC SIGMOID COLECTOMY N/A 11/11/2017   Procedure: LAPAROSCOPIC SIGMOID COLECTOMY;  Surgeon: Jules Husbands, MD;  Location: [facility] ORS;  Service: General;  Laterality: N/A;  . MOUTH SURGERY    . PORTACATH PLACEMENT Right 11/29/2017   Procedure: INSERTION PORT-A-CATH;  Surgeon: Jules Husbands, MD;  Location: [facility];  Service: General;  Laterality: Right;  May do local and IV sedation. Please have U/S  . TOTAL ABDOMINAL HYSTERECTOMY  05/20/2014   ooperectomy unilateral    FAMILY HISTORY: family history includes Breast cancer (age of onset: 34) in her mother; Colon cancer (age of onset: 42) in her maternal aunt; Hernia in her brother; Hypertension in her mother; Stomach cancer in her paternal grandfather; Stomach cancer (age of onset: 43) in her paternal aunt; Stomach cancer (age of onset: 63) in her maternal uncle; Thyroid cancer (age of onset: 61) in her maternal grandmother; Thyroid disease in her brother.  SOCIAL HISTORY:  reports that she has never smoked. She has never used smokeless tobacco. She reports that she does not drink alcohol or use drugs.  ALLERGIES: Patient has no known allergies.  MEDICATIONS:  Current Outpatient Medications  Medication Sig Dispense Refill  . apixaban (ELIQUIS) 5 MG TABS tablet Take 1 tablet (5 mg total) by mouth 2 (two) times daily. [place]  tablet 0  . APPLE CIDER VINEGAR PO Take 1 tablet by mouth daily.    . Biotin 1000 MCG tablet Take 1,000 mcg by mouth daily.    . Cyanocobalamin  (VITAMIN B 12 PO) Take by mouth.    . diphenhydrAMINE (BENADRYL) 25 MG tablet Take 25 mg by mouth every 6 (six) hours as needed for itching or allergies.    . DULoxetine (CYMBALTA) 30 MG capsule Take 1 capsule (30 mg total) by mouth daily. For 7 days and then bid for rest of the month (Patient taking differently: Take 30 mg by mouth 2 (two) times daily. For 7 days and then bid for rest of the month) 59 capsule 0  . levothyroxine (SYNTHROID, LEVOTHROID) 200 MCG tablet Take 1 tablet (200 mcg total) by mouth daily before breakfast. 30 tablet 2  . lidocaine-prilocaine (EMLA) cream Apply to affected area once 30 g 3  . loratadine (CLARITIN) 10 MG tablet Take 10 mg by mouth daily.    Marland Kitchen LORazepam (ATIVAN) 0.5 MG tablet Take 1 tablet (0.5 mg total) by mouth every 6 (six) hours as needed (Nausea or vomiting). (Patient not taking: Reported on 05/30/2018) 30 tablet 0  . Multiple Vitamin tablet Take 2 tablets by mouth daily.     . OMEGA-3 FATTY ACIDS PO Take 2 capsules by mouth daily.     . ondansetron (ZOFRAN) 8 MG tablet Take 1 tablet (8 mg total) by mouth 2 (two) times daily as needed for refractory nausea / vomiting. Start on day 3 after chemotherapy. (Patient not taking: Reported on 05/30/2018) 30 tablet 1  . prochlorperazine (COMPAZINE) 10 MG tablet Take 1 tablet (10 mg total) by mouth every 6 (six) hours as needed (Nausea or vomiting). (Patient not taking: Reported on 05/30/2018) 30 tablet 1  . VITAMIN D, CHOLECALCIFEROL, PO Take 1 tablet by mouth daily.     No current facility-administered medications for this encounter.    Facility-Administered Medications Ordered in Other Encounters  Medication Dose Route Frequency Provider Last Rate Last Dose  . heparin lock flush 100 unit/mL  500 Units Intracatheter PRN Sindy Guadeloupe, MD      . sodium chloride flush (NS) 0.9 % injection 10 mL  10 mL Intravenous PRN Sindy Guadeloupe, MD   10 mL at 06/13/18 0844    ECOG PERFORMANCE STATUS:  0 - Asymptomatic  REVIEW  OF SYSTEMS:  Patient denies any weight loss, fatigue, weakness, fever, chills or night sweats. Patient denies any loss of vision, blurred vision. Patient denies any ringing  of the ears or hearing loss. No irregular heartbeat. Patient denies heart murmur or history of fainting. Patient denies any chest pain or pain radiating to her upper extremities. Patient denies any shortness of breath, difficulty breathing at night, cough or hemoptysis. Patient denies any swelling in the lower legs. Patient denies any nausea vomiting, vomiting of blood, or coffee ground material in the vomitus. Patient denies any stomach pain. Patient states has had normal bowel movements no significant constipation or diarrhea. Patient denies any dysuria, hematuria or significant nocturia. Patient denies any problems walking, swelling in the joints or loss of balance. Patient denies any skin changes, loss of hair or loss of weight. Patient denies any excessive worrying or anxiety or significant depression. Patient denies any problems with insomnia. Patient denies excessive thirst, polyuria, polydipsia. Patient denies any swollen glands, patient denies easy bruising or easy bleeding. Patient denies any recent infections, allergies or URI. Patient "s visual fields have not changed  significantly in recent time.    PHYSICAL EXAM: BP (!) 169/94   Pulse 91   Temp 98 F (36.7 C)   Wt 212 lb 1.3 oz (96.2 kg)   BMI 33.22 kg/m  Well-developed well-nourished patient in NAD. HEENT reveals PERLA, EOMI, discs not visualized.  Oral cavity is clear. No oral mucosal lesions are identified. Neck is clear without evidence of cervical or supraclavicular adenopathy. Lungs are clear to A&P. Cardiac examination is essentially unremarkable with regular rate and rhythm without murmur rub or thrill. Abdomen is benign with no organomegaly or masses noted. Motor sensory and DTR levels are equal and symmetric in the upper and lower extremities. Cranial nerves  II through XII are grossly intact. Proprioception is intact. No peripheral adenopathy or edema is identified. No motor or sensory levels are noted. Crude visual fields are within normal range.  LABORATORY DATA: pathology reports reviewed    RADIOLOGY RESULTS:CT scans reviewed   IMPRESSION: stage IIIc mucinous invasive carcinoma the sigmoid colon status post resection and adjuvant FOLFOX chemotherapy in 47 year old female  PLAN: at this time based on her young age T4 status of her tumor lymphovascular invasion as well as perineural invasion and positive lymph node would recommend by [place] and guidelines adjuvant radiation therapy. Would treat her whole pelvis to 4500 cGy over 5 weeks. Would boost the area of initial involvement 540 cGy Risks and benefits of treatment including increased lower urinary tract symptoms diarrhea fatigue alteration of blood counts skin reaction all were discussed in detail with the patient. She seems to comprehend my treatment plan well. I have personally set up and ordered CT simulation for next week.There will be extra effort by both professional staff as well as technical staff to coordinate and manage concurrent chemoradiation and ensuing side effects during are treatments.I discussed the case personally with medical oncology.  I would like to take this opportunity to thank you for allowing me to participate in the care of your patient.Noreene Filbert, MD

## 2018-07-25 ENCOUNTER — Ambulatory Visit: Payer: Managed Care, Other (non HMO)

## 2018-07-25 ENCOUNTER — Ambulatory Visit
Admission: RE | Admit: 2018-07-25 | Discharge: 2018-07-25 | Disposition: A | Discharge status: Home / Self Care | Payer: Managed Care, Other (non HMO) | Source: Ambulatory Visit | Attending: Radiation Oncology | Admitting: Radiation Oncology

## 2018-07-25 DIAGNOSIS — C186 Malignant neoplasm of descending colon: Secondary | ICD-10-CM | POA: Diagnosis present

## 2018-07-25 DIAGNOSIS — Z510 Encounter for antineoplastic radiation therapy: Secondary | ICD-10-CM | POA: Diagnosis not present

## 2018-07-25 DIAGNOSIS — C187 Malignant neoplasm of sigmoid colon: Secondary | ICD-10-CM | POA: Insufficient documentation

## 2018-07-28 ENCOUNTER — Other Ambulatory Visit: Payer: Self-pay | Admitting: *Deleted

## 2018-07-28 DIAGNOSIS — C187 Malignant neoplasm of sigmoid colon: Secondary | ICD-10-CM

## 2018-07-30 NOTE — Telephone Encounter (Signed)
Duplicate encounter for same date of service

## 2018-07-31 DIAGNOSIS — C186 Malignant neoplasm of descending colon: Secondary | ICD-10-CM | POA: Diagnosis not present

## 2018-08-03 ENCOUNTER — Ambulatory Visit
Admission: RE | Admit: 2018-08-03 | Discharge: 2018-08-03 | Disposition: A | Discharge status: Home / Self Care | Payer: Managed Care, Other (non HMO) | Source: Ambulatory Visit | Attending: Radiation Oncology | Admitting: Radiation Oncology

## 2018-08-03 DIAGNOSIS — C186 Malignant neoplasm of descending colon: Secondary | ICD-10-CM | POA: Diagnosis not present

## 2018-08-06 ENCOUNTER — Ambulatory Visit: Admission: RE | Admit: 2018-08-06 | Payer: Managed Care, Other (non HMO) | Source: Ambulatory Visit

## 2018-08-07 ENCOUNTER — Ambulatory Visit
Admission: RE | Admit: 2018-08-07 | Discharge: 2018-08-07 | Disposition: A | Discharge status: Home / Self Care | Payer: Managed Care, Other (non HMO) | Source: Ambulatory Visit | Attending: Radiation Oncology | Admitting: Radiation Oncology

## 2018-08-07 ENCOUNTER — Ambulatory Visit: Payer: Managed Care, Other (non HMO)

## 2018-08-07 DIAGNOSIS — C186 Malignant neoplasm of descending colon: Secondary | ICD-10-CM | POA: Diagnosis not present

## 2018-08-08 ENCOUNTER — Ambulatory Visit
Admission: RE | Admit: 2018-08-08 | Discharge: 2018-08-08 | Disposition: A | Discharge status: Home / Self Care | Payer: Managed Care, Other (non HMO) | Source: Ambulatory Visit | Attending: Radiation Oncology | Admitting: Radiation Oncology

## 2018-08-08 DIAGNOSIS — C186 Malignant neoplasm of descending colon: Secondary | ICD-10-CM | POA: Diagnosis not present

## 2018-08-09 ENCOUNTER — Ambulatory Visit
Admission: RE | Admit: 2018-08-09 | Discharge: 2018-08-09 | Disposition: A | Discharge status: Home / Self Care | Payer: Managed Care, Other (non HMO) | Source: Ambulatory Visit | Attending: Radiation Oncology | Admitting: Radiation Oncology

## 2018-08-09 DIAGNOSIS — C186 Malignant neoplasm of descending colon: Secondary | ICD-10-CM | POA: Diagnosis not present

## 2018-08-10 ENCOUNTER — Ambulatory Visit
Admission: RE | Admit: 2018-08-10 | Discharge: 2018-08-10 | Disposition: A | Discharge status: Home / Self Care | Payer: Managed Care, Other (non HMO) | Source: Ambulatory Visit | Attending: Radiation Oncology | Admitting: Radiation Oncology

## 2018-08-10 ENCOUNTER — Inpatient Hospital Stay: Payer: Managed Care, Other (non HMO)

## 2018-08-10 DIAGNOSIS — C186 Malignant neoplasm of descending colon: Secondary | ICD-10-CM | POA: Diagnosis not present

## 2018-08-11 ENCOUNTER — Ambulatory Visit
Admission: RE | Admit: 2018-08-11 | Discharge: 2018-08-11 | Disposition: A | Discharge status: Home / Self Care | Payer: Managed Care, Other (non HMO) | Source: Ambulatory Visit | Attending: Radiation Oncology | Admitting: Radiation Oncology

## 2018-08-11 DIAGNOSIS — C186 Malignant neoplasm of descending colon: Secondary | ICD-10-CM | POA: Diagnosis not present

## 2018-08-14 ENCOUNTER — Ambulatory Visit
Admission: RE | Admit: 2018-08-14 | Discharge: 2018-08-14 | Disposition: A | Discharge status: Home / Self Care | Payer: Managed Care, Other (non HMO) | Source: Ambulatory Visit | Attending: Radiation Oncology | Admitting: Radiation Oncology

## 2018-08-14 DIAGNOSIS — C186 Malignant neoplasm of descending colon: Secondary | ICD-10-CM | POA: Diagnosis not present

## 2018-08-15 ENCOUNTER — Ambulatory Visit
Admission: RE | Admit: 2018-08-15 | Discharge: 2018-08-15 | Disposition: A | Discharge status: Home / Self Care | Payer: Managed Care, Other (non HMO) | Source: Ambulatory Visit | Attending: Radiation Oncology | Admitting: Radiation Oncology

## 2018-08-15 ENCOUNTER — Inpatient Hospital Stay: Payer: Managed Care, Other (non HMO)

## 2018-08-15 ENCOUNTER — Other Ambulatory Visit: Payer: Self-pay

## 2018-08-15 ENCOUNTER — Inpatient Hospital Stay: Payer: Managed Care, Other (non HMO) | Attending: Oncology

## 2018-08-15 DIAGNOSIS — C187 Malignant neoplasm of sigmoid colon: Secondary | ICD-10-CM

## 2018-08-15 DIAGNOSIS — Z95828 Presence of other vascular implants and grafts: Secondary | ICD-10-CM

## 2018-08-15 DIAGNOSIS — C186 Malignant neoplasm of descending colon: Secondary | ICD-10-CM | POA: Diagnosis not present

## 2018-08-15 LAB — CBC
HCT: 38.9 % (ref 35.0–47.0)
Hemoglobin: 13.4 g/dL (ref 12.0–16.0)
MCH: 31.5 pg (ref 26.0–34.0)
MCHC: 34.4 g/dL (ref 32.0–36.0)
MCV: 91.6 fL (ref 80.0–100.0)
PLATELETS: 169 10*3/uL (ref 150–440)
RBC: 4.24 MIL/uL (ref 3.80–5.20)
RDW: 12.9 % (ref 11.5–14.5)
WBC: 3 10*3/uL — AB (ref 3.6–11.0)

## 2018-08-15 MED ORDER — SODIUM CHLORIDE 0.9% FLUSH
10.0000 mL | INTRAVENOUS | Status: DC | PRN
  Administered 2018-08-15: 10 mL via INTRAVENOUS
  Filled 2018-08-15: qty 10

## 2018-08-15 MED ORDER — HEPARIN SOD (PORK) LOCK FLUSH 100 UNIT/ML IV SOLN
500.0000 [IU] | Freq: Once | INTRAVENOUS | Status: AC
  Administered 2018-08-15: 500 [IU] via INTRAVENOUS

## 2018-08-16 ENCOUNTER — Ambulatory Visit
Admission: RE | Admit: 2018-08-16 | Discharge: 2018-08-16 | Disposition: A | Discharge status: Home / Self Care | Payer: Managed Care, Other (non HMO) | Source: Ambulatory Visit | Attending: Radiation Oncology | Admitting: Radiation Oncology

## 2018-08-16 DIAGNOSIS — C186 Malignant neoplasm of descending colon: Secondary | ICD-10-CM | POA: Diagnosis not present

## 2018-08-17 ENCOUNTER — Inpatient Hospital Stay: Payer: Managed Care, Other (non HMO)

## 2018-08-17 ENCOUNTER — Ambulatory Visit
Admission: RE | Admit: 2018-08-17 | Discharge: 2018-08-17 | Disposition: A | Discharge status: Home / Self Care | Payer: Managed Care, Other (non HMO) | Source: Ambulatory Visit | Attending: Radiation Oncology | Admitting: Radiation Oncology

## 2018-08-17 DIAGNOSIS — C186 Malignant neoplasm of descending colon: Secondary | ICD-10-CM | POA: Diagnosis not present

## 2018-08-18 ENCOUNTER — Ambulatory Visit
Admission: RE | Admit: 2018-08-18 | Discharge: 2018-08-18 | Disposition: A | Discharge status: Home / Self Care | Payer: Managed Care, Other (non HMO) | Source: Ambulatory Visit | Attending: Radiation Oncology | Admitting: Radiation Oncology

## 2018-08-18 DIAGNOSIS — C186 Malignant neoplasm of descending colon: Secondary | ICD-10-CM | POA: Diagnosis not present

## 2018-08-22 ENCOUNTER — Ambulatory Visit
Admission: RE | Admit: 2018-08-22 | Discharge: 2018-08-22 | Disposition: A | Discharge status: Home / Self Care | Payer: Managed Care, Other (non HMO) | Source: Ambulatory Visit | Attending: Radiation Oncology | Admitting: Radiation Oncology

## 2018-08-22 DIAGNOSIS — Z510 Encounter for antineoplastic radiation therapy: Secondary | ICD-10-CM | POA: Diagnosis not present

## 2018-08-22 DIAGNOSIS — C186 Malignant neoplasm of descending colon: Secondary | ICD-10-CM | POA: Diagnosis not present

## 2018-08-23 ENCOUNTER — Other Ambulatory Visit: Payer: Self-pay | Admitting: *Deleted

## 2018-08-23 ENCOUNTER — Ambulatory Visit
Admission: RE | Admit: 2018-08-23 | Discharge: 2018-08-23 | Disposition: A | Discharge status: Home / Self Care | Payer: Managed Care, Other (non HMO) | Source: Ambulatory Visit | Attending: Radiation Oncology | Admitting: Radiation Oncology

## 2018-08-23 DIAGNOSIS — C186 Malignant neoplasm of descending colon: Secondary | ICD-10-CM | POA: Diagnosis not present

## 2018-08-23 MED ORDER — SUCRALFATE 1 G PO TABS: 1.0000 g | ORAL_TABLET | Freq: Three times a day (TID) | ORAL | 1 refills | Status: DC

## 2018-08-24 ENCOUNTER — Ambulatory Visit
Admission: RE | Admit: 2018-08-24 | Discharge: 2018-08-24 | Disposition: A | Discharge status: Home / Self Care | Payer: Managed Care, Other (non HMO) | Source: Ambulatory Visit | Attending: Radiation Oncology | Admitting: Radiation Oncology

## 2018-08-24 ENCOUNTER — Other Ambulatory Visit: Payer: Self-pay

## 2018-08-24 ENCOUNTER — Inpatient Hospital Stay: Payer: Managed Care, Other (non HMO) | Attending: Radiation Oncology

## 2018-08-24 DIAGNOSIS — C187 Malignant neoplasm of sigmoid colon: Secondary | ICD-10-CM | POA: Insufficient documentation

## 2018-08-24 DIAGNOSIS — C186 Malignant neoplasm of descending colon: Secondary | ICD-10-CM | POA: Diagnosis not present

## 2018-08-24 LAB — CBC
HCT: 38.9 % (ref 35.0–47.0)
Hemoglobin: 13.4 g/dL (ref 12.0–16.0)
MCH: 31.1 pg (ref 26.0–34.0)
MCHC: 34.3 g/dL (ref 32.0–36.0)
MCV: 90.5 fL (ref 80.0–100.0)
PLATELETS: 136 10*3/uL — AB (ref 150–440)
RBC: 4.3 MIL/uL (ref 3.80–5.20)
RDW: 13 % (ref 11.5–14.5)
WBC: 3.1 10*3/uL — ABNORMAL LOW (ref 3.6–11.0)

## 2018-08-25 ENCOUNTER — Ambulatory Visit
Admission: RE | Admit: 2018-08-25 | Discharge: 2018-08-25 | Disposition: A | Discharge status: Home / Self Care | Payer: Managed Care, Other (non HMO) | Source: Ambulatory Visit | Attending: Radiation Oncology | Admitting: Radiation Oncology

## 2018-08-25 DIAGNOSIS — C186 Malignant neoplasm of descending colon: Secondary | ICD-10-CM | POA: Diagnosis not present

## 2018-08-28 ENCOUNTER — Ambulatory Visit
Admission: RE | Admit: 2018-08-28 | Discharge: 2018-08-28 | Disposition: A | Discharge status: Home / Self Care | Payer: Managed Care, Other (non HMO) | Source: Ambulatory Visit | Attending: Radiation Oncology | Admitting: Radiation Oncology

## 2018-08-28 DIAGNOSIS — C186 Malignant neoplasm of descending colon: Secondary | ICD-10-CM | POA: Diagnosis not present

## 2018-08-29 ENCOUNTER — Ambulatory Visit
Admission: RE | Admit: 2018-08-29 | Discharge: 2018-08-29 | Disposition: A | Discharge status: Home / Self Care | Payer: Managed Care, Other (non HMO) | Source: Ambulatory Visit | Attending: Radiation Oncology | Admitting: Radiation Oncology

## 2018-08-29 DIAGNOSIS — C186 Malignant neoplasm of descending colon: Secondary | ICD-10-CM | POA: Diagnosis not present

## 2018-08-30 ENCOUNTER — Ambulatory Visit
Admission: RE | Admit: 2018-08-30 | Discharge: 2018-08-30 | Disposition: A | Discharge status: Home / Self Care | Payer: Managed Care, Other (non HMO) | Source: Ambulatory Visit | Attending: Radiation Oncology | Admitting: Radiation Oncology

## 2018-08-30 DIAGNOSIS — C186 Malignant neoplasm of descending colon: Secondary | ICD-10-CM | POA: Diagnosis not present

## 2018-08-31 ENCOUNTER — Ambulatory Visit
Admission: RE | Admit: 2018-08-31 | Discharge: 2018-08-31 | Disposition: A | Discharge status: Home / Self Care | Payer: Managed Care, Other (non HMO) | Source: Ambulatory Visit | Attending: Radiation Oncology | Admitting: Radiation Oncology

## 2018-08-31 ENCOUNTER — Inpatient Hospital Stay: Payer: Managed Care, Other (non HMO)

## 2018-08-31 ENCOUNTER — Other Ambulatory Visit: Payer: Self-pay

## 2018-08-31 DIAGNOSIS — C187 Malignant neoplasm of sigmoid colon: Secondary | ICD-10-CM

## 2018-08-31 DIAGNOSIS — C186 Malignant neoplasm of descending colon: Secondary | ICD-10-CM | POA: Diagnosis not present

## 2018-08-31 LAB — CBC
HEMATOCRIT: 38.5 % (ref 35.0–47.0)
Hemoglobin: 13.2 g/dL (ref 12.0–16.0)
MCH: 30.7 pg (ref 26.0–34.0)
MCHC: 34.2 g/dL (ref 32.0–36.0)
MCV: 89.7 fL (ref 80.0–100.0)
Platelets: 171 10*3/uL (ref 150–440)
RBC: 4.3 MIL/uL (ref 3.80–5.20)
RDW: 12.9 % (ref 11.5–14.5)
WBC: 3.3 10*3/uL — AB (ref 3.6–11.0)

## 2018-09-01 ENCOUNTER — Ambulatory Visit
Admission: RE | Admit: 2018-09-01 | Discharge: 2018-09-01 | Disposition: A | Discharge status: Home / Self Care | Payer: Managed Care, Other (non HMO) | Source: Ambulatory Visit | Attending: Radiation Oncology | Admitting: Radiation Oncology

## 2018-09-01 DIAGNOSIS — C186 Malignant neoplasm of descending colon: Secondary | ICD-10-CM | POA: Diagnosis not present

## 2018-09-04 ENCOUNTER — Ambulatory Visit
Admission: RE | Admit: 2018-09-04 | Discharge: 2018-09-04 | Disposition: A | Discharge status: Home / Self Care | Payer: Managed Care, Other (non HMO) | Source: Ambulatory Visit | Attending: Radiation Oncology | Admitting: Radiation Oncology

## 2018-09-04 DIAGNOSIS — C186 Malignant neoplasm of descending colon: Secondary | ICD-10-CM | POA: Diagnosis not present

## 2018-09-05 ENCOUNTER — Ambulatory Visit
Admission: RE | Admit: 2018-09-05 | Discharge: 2018-09-05 | Disposition: A | Discharge status: Home / Self Care | Payer: Managed Care, Other (non HMO) | Source: Ambulatory Visit | Attending: Radiation Oncology | Admitting: Radiation Oncology

## 2018-09-05 DIAGNOSIS — C186 Malignant neoplasm of descending colon: Secondary | ICD-10-CM | POA: Diagnosis not present

## 2018-09-06 ENCOUNTER — Ambulatory Visit
Admission: RE | Admit: 2018-09-06 | Discharge: 2018-09-06 | Disposition: A | Discharge status: Home / Self Care | Payer: Managed Care, Other (non HMO) | Source: Ambulatory Visit | Attending: Radiation Oncology | Admitting: Radiation Oncology

## 2018-09-06 DIAGNOSIS — C186 Malignant neoplasm of descending colon: Secondary | ICD-10-CM | POA: Diagnosis not present

## 2018-09-07 ENCOUNTER — Other Ambulatory Visit: Payer: Self-pay

## 2018-09-07 ENCOUNTER — Inpatient Hospital Stay: Payer: Managed Care, Other (non HMO)

## 2018-09-07 ENCOUNTER — Ambulatory Visit
Admission: RE | Admit: 2018-09-07 | Discharge: 2018-09-07 | Disposition: A | Discharge status: Home / Self Care | Payer: Managed Care, Other (non HMO) | Source: Ambulatory Visit | Attending: Radiation Oncology | Admitting: Radiation Oncology

## 2018-09-07 DIAGNOSIS — C187 Malignant neoplasm of sigmoid colon: Secondary | ICD-10-CM | POA: Diagnosis not present

## 2018-09-07 DIAGNOSIS — C186 Malignant neoplasm of descending colon: Secondary | ICD-10-CM | POA: Diagnosis not present

## 2018-09-07 LAB — CBC
HCT: 37.8 % (ref 35.0–47.0)
Hemoglobin: 13.3 g/dL (ref 12.0–16.0)
MCH: 31.2 pg (ref 26.0–34.0)
MCHC: 35.3 g/dL (ref 32.0–36.0)
MCV: 88.5 fL (ref 80.0–100.0)
PLATELETS: 160 10*3/uL (ref 150–440)
RBC: 4.27 MIL/uL (ref 3.80–5.20)
RDW: 12.8 % (ref 11.5–14.5)
WBC: 3.1 10*3/uL — ABNORMAL LOW (ref 3.6–11.0)

## 2018-09-08 ENCOUNTER — Ambulatory Visit
Admission: RE | Admit: 2018-09-08 | Discharge: 2018-09-08 | Disposition: A | Discharge status: Home / Self Care | Payer: Managed Care, Other (non HMO) | Source: Ambulatory Visit | Attending: Radiation Oncology | Admitting: Radiation Oncology

## 2018-09-08 DIAGNOSIS — C186 Malignant neoplasm of descending colon: Secondary | ICD-10-CM | POA: Diagnosis not present

## 2018-09-11 ENCOUNTER — Ambulatory Visit: Payer: Managed Care, Other (non HMO)

## 2018-09-12 ENCOUNTER — Ambulatory Visit: Payer: Managed Care, Other (non HMO)

## 2018-09-13 ENCOUNTER — Ambulatory Visit: Payer: Managed Care, Other (non HMO)

## 2018-09-14 ENCOUNTER — Ambulatory Visit: Payer: Managed Care, Other (non HMO)

## 2018-09-15 ENCOUNTER — Ambulatory Visit: Payer: Managed Care, Other (non HMO)

## 2018-09-18 ENCOUNTER — Ambulatory Visit
Admission: RE | Admit: 2018-09-18 | Discharge: 2018-09-18 | Disposition: A | Discharge status: Home / Self Care | Payer: Managed Care, Other (non HMO) | Source: Ambulatory Visit | Attending: Radiation Oncology | Admitting: Radiation Oncology

## 2018-09-18 DIAGNOSIS — C186 Malignant neoplasm of descending colon: Secondary | ICD-10-CM | POA: Diagnosis not present

## 2018-09-19 ENCOUNTER — Ambulatory Visit
Admission: RE | Admit: 2018-09-19 | Discharge: 2018-09-19 | Disposition: A | Discharge status: Home / Self Care | Payer: Managed Care, Other (non HMO) | Source: Ambulatory Visit | Attending: Radiation Oncology | Admitting: Radiation Oncology

## 2018-09-19 DIAGNOSIS — Z510 Encounter for antineoplastic radiation therapy: Secondary | ICD-10-CM | POA: Diagnosis not present

## 2018-09-19 DIAGNOSIS — C186 Malignant neoplasm of descending colon: Secondary | ICD-10-CM | POA: Insufficient documentation

## 2018-09-20 ENCOUNTER — Ambulatory Visit
Admission: RE | Admit: 2018-09-20 | Discharge: 2018-09-20 | Disposition: A | Discharge status: Home / Self Care | Payer: Managed Care, Other (non HMO) | Source: Ambulatory Visit | Attending: Radiation Oncology | Admitting: Radiation Oncology

## 2018-09-20 DIAGNOSIS — C186 Malignant neoplasm of descending colon: Secondary | ICD-10-CM | POA: Diagnosis not present

## 2018-09-21 ENCOUNTER — Ambulatory Visit
Admission: RE | Admit: 2018-09-21 | Discharge: 2018-09-21 | Disposition: A | Discharge status: Home / Self Care | Payer: Managed Care, Other (non HMO) | Source: Ambulatory Visit | Attending: Radiation Oncology | Admitting: Radiation Oncology

## 2018-09-21 DIAGNOSIS — C186 Malignant neoplasm of descending colon: Secondary | ICD-10-CM | POA: Diagnosis not present

## 2018-10-03 ENCOUNTER — Encounter: Payer: Self-pay | Admitting: Oncology

## 2018-10-03 ENCOUNTER — Inpatient Hospital Stay: Payer: Managed Care, Other (non HMO) | Attending: Oncology

## 2018-10-03 ENCOUNTER — Inpatient Hospital Stay (HOSPITAL_BASED_OUTPATIENT_CLINIC_OR_DEPARTMENT_OTHER): Payer: Managed Care, Other (non HMO) | Admitting: Oncology

## 2018-10-03 VITALS — BP 165/98 | HR 84 | Temp 98.0°F | Resp 18 | Ht 67.0 in | Wt 206.6 lb

## 2018-10-03 DIAGNOSIS — C187 Malignant neoplasm of sigmoid colon: Secondary | ICD-10-CM | POA: Insufficient documentation

## 2018-10-03 DIAGNOSIS — Z08 Encounter for follow-up examination after completed treatment for malignant neoplasm: Secondary | ICD-10-CM

## 2018-10-03 DIAGNOSIS — Z923 Personal history of irradiation: Secondary | ICD-10-CM | POA: Insufficient documentation

## 2018-10-03 DIAGNOSIS — Z9221 Personal history of antineoplastic chemotherapy: Secondary | ICD-10-CM

## 2018-10-03 DIAGNOSIS — Z85038 Personal history of other malignant neoplasm of large intestine: Secondary | ICD-10-CM

## 2018-10-03 LAB — CBC WITH DIFFERENTIAL/PLATELET
Abs Immature Granulocytes: 0.01 10*3/uL (ref 0.00–0.07)
Basophils Absolute: 0 10*3/uL (ref 0.0–0.1)
Basophils Relative: 1 %
EOS ABS: 0.1 10*3/uL (ref 0.0–0.5)
EOS PCT: 3 %
HEMATOCRIT: 36 % (ref 36.0–46.0)
HEMOGLOBIN: 12.4 g/dL (ref 12.0–15.0)
Immature Granulocytes: 0 %
LYMPHS ABS: 0.8 10*3/uL (ref 0.7–4.0)
LYMPHS PCT: 26 %
MCH: 29.9 pg (ref 26.0–34.0)
MCHC: 34.4 g/dL (ref 30.0–36.0)
MCV: 86.7 fL (ref 80.0–100.0)
MONOS PCT: 12 %
Monocytes Absolute: 0.4 10*3/uL (ref 0.1–1.0)
Neutro Abs: 1.9 10*3/uL (ref 1.7–7.7)
Neutrophils Relative %: 58 %
Platelets: 180 10*3/uL (ref 150–400)
RBC: 4.15 MIL/uL (ref 3.87–5.11)
RDW: 13.8 % (ref 11.5–15.5)
WBC: 3.2 10*3/uL — ABNORMAL LOW (ref 4.0–10.5)
nRBC: 0 % (ref 0.0–0.2)

## 2018-10-03 LAB — COMPREHENSIVE METABOLIC PANEL
ALBUMIN: 4.1 g/dL (ref 3.5–5.0)
ALT: 54 U/L — AB (ref 0–44)
AST: 30 U/L (ref 15–41)
Alkaline Phosphatase: 124 U/L (ref 38–126)
Anion gap: 8 (ref 5–15)
BUN: 9 mg/dL (ref 6–20)
CALCIUM: 9.4 mg/dL (ref 8.9–10.3)
CHLORIDE: 104 mmol/L (ref 98–111)
CO2: 27 mmol/L (ref 22–32)
CREATININE: 0.53 mg/dL (ref 0.44–1.00)
GFR calc Af Amer: >60 mL/min (ref >60)
Glucose, Bld: 110 mg/dL — ABNORMAL HIGH (ref 70–99)
Potassium: 3.6 mmol/L (ref 3.5–5.1)
Sodium: 139 mmol/L (ref 135–145)
Total Bilirubin: 0.5 mg/dL (ref 0.3–1.2)
Total Protein: 7.2 g/dL (ref 6.5–8.1)

## 2018-10-03 MED ORDER — SODIUM CHLORIDE 0.9% FLUSH
10.0000 mL | Freq: Once | INTRAVENOUS | Status: AC
  Administered 2018-10-03: 10 mL via INTRAVENOUS
  Filled 2018-10-03: qty 10

## 2018-10-03 MED ORDER — HEPARIN SOD (PORK) LOCK FLUSH 100 UNIT/ML IV SOLN
500.0000 [IU] | Freq: Once | INTRAVENOUS | Status: AC
  Administered 2018-10-03: 500 [IU] via INTRAVENOUS

## 2018-10-03 NOTE — Progress Notes (Signed)
No new changes noted today 

## 2018-10-04 LAB — CEA: CEA1: 0.7 ng/mL (ref 0.0–4.7)

## 2018-10-07 NOTE — Progress Notes (Signed)
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 Vicente Males from GI for recurrent episodes of diverticulitis sinceaugust2018 which was treated with antibiotics. However patient continued to have some abdominal cramping along with some bleeding in her stool and constipation alternating with diarrhea. Patient underwent flexible sigmoidoscopy on 11/07/2017. A circumferential infiltrative completely obstructing large mass was found in the rectosigmoid colon.Biopsy showed at least high-grade dysplasia. Patient was referred to Dr. Derinda Sis consideration of hemicolectomy.  Patient underwent laparoscopic low anterior resection along with small bowel resection and laparoscopic takedown of the splenic flexure on 11/11/2017. 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 Tumor size was 3 cm, moderately differentiated. All margins were negative for invasive carcinoma. Perineural invasion and lymphovascular invasion was present. 1 out of 17 lymph nodes was positive for malignancy. Pathologic stage was pT4b pN1a. MSI stable  Preoperative CEA is not available. CT abdomen on 10/26/2017 showed residual changes of diverticulitis although improved when compared to prior exam. No perforation or abscess is identified.  No family history of colon cancer. History of breast cancer in her mother in her 57s. Genetic  testing was negative  Patient completed 10 cycles of FOLFOX chemotherapy in June 2019. oxaliplatin stopped after 9 cycles. She did not wish to complete 12 cycles due to neuropathy and fatigue. Patient also had RT given T4 disease  Interval history- she is doing well overall. Reports that her neuropathy is mild mainly in her fingertips and toes. She can still carry out her ADL's without difficulty. She completed RT 2 weeks back. Bowel movements are regular  ECOG PS- 0 Pain scale- 0   Review of systems- Review of Systems  Constitutional: Negative for chills, fever, malaise/fatigue and weight loss.  HENT: Negative for congestion, ear discharge and nosebleeds.   Eyes: Negative for blurred vision.  Respiratory: Negative for cough, hemoptysis, sputum production, shortness of breath and wheezing.   Cardiovascular: Negative for chest pain, palpitations, orthopnea and claudication.  Gastrointestinal: Negative for abdominal pain, blood in stool, constipation, diarrhea, heartburn, melena, nausea and vomiting.  Genitourinary: Negative for dysuria, flank pain, frequency, hematuria and urgency.  Musculoskeletal: Negative for back pain, joint pain and myalgias.  Skin: Negative for rash.  Neurological: Positive for tingling. Negative for dizziness, focal weakness, seizures, weakness and headaches.  Endo/Heme/Allergies: Does not bruise/bleed easily.  Psychiatric/Behavioral: Negative for depression and suicidal ideas. The patient does not have insomnia.       No Known Allergies   Past Medical History:  Diagnosis Date  . Anemia   . Cancer of sigmoid colon ([place]) 11/11/2017   Partial sigmoid colon resection.   . Diverticulosis   . Genetic testing 12/14/2017   Common Cancers panel (47 genes) @ [facility] - No pathogenic mutations detected  . Headache   . Heart murmur   . Hyperlipidemia   . Hypothyroidism   . Thyroid disease      Past Surgical History:  Procedure Laterality Date  . APPENDECTOMY  possible during hysterectomy  . BOWEL RESECTION N/A 11/11/2017   Procedure: SMALL BOWEL RESECTION;  Surgeon: Jules Husbands, MD;  Location: [facility] ORS;  Service: General;  Laterality: N/A;  . FLEXIBLE SIGMOIDOSCOPY N/A 11/07/2017   Procedure: Beryle Quant;  Surgeon: Jonathon Bellows, MD;  Location: [facility] ENDOSCOPY;  Service: Gastroenterology;  Laterality: N/A;  . LAPAROSCOPIC SIGMOID COLECTOMY N/A 11/11/2017   Procedure: LAPAROSCOPIC SIGMOID COLECTOMY;  Surgeon: Jules Husbands, MD;  Location: [facility] ORS;  Service: General;  Laterality: N/A;  . MOUTH SURGERY    . PORTACATH PLACEMENT Right 11/29/2017   Procedure: INSERTION PORT-A-CATH;  Surgeon: Jules Husbands, MD;  Location: [place];  Service: General;  Laterality: Right;  May do local and IV sedation. Please have U/S  . TOTAL ABDOMINAL HYSTERECTOMY  05/20/2014   ooperectomy unilateral    Social History   Socioeconomic History  . Marital status: Married    Spouse name: Not on file  . Number of children: Not on file  . Years of education: Not on file  . Highest education level: Not on file  Occupational History  . Not on file  Social Needs  . Financial resource strain: Not on file  . Food insecurity:    Worry: Not on file    Inability: Not on file  . Transportation needs:    Medical: Not on file    Non-medical: Not on file  Tobacco Use  . Smoking status: Never Smoker  . Smokeless tobacco: Never Used  Substance and Sexual Activity  . Alcohol use: No    Alcohol/week: 0.0 standard drinks  . Drug use: No  . Sexual activity: Yes  Lifestyle  . Physical activity:    Days per week: Not on file    Minutes per session: Not on file  . Stress: Not on file  Relationships  . Social connections:    Talks on phone: Not on file    Gets together: Not on file    Attends religious service: Not on file    Active member of club or organization: Not on file    Attends meetings of clubs or organizations: Not on file     Relationship status: Not on file  . Intimate partner violence:    Fear of current or ex partner: Not on file    Emotionally abused: Not on file    Physically abused: Not on file    Forced sexual activity: Not on file  Other Topics Concern  . Not on file  Social History Narrative  . Not on file    Family History  Problem Relation Age of Onset  . Breast cancer Mother 60       currently 8  . Hypertension Mother   . Thyroid disease Brother   . Hernia Brother   . Thyroid cancer Maternal Grandmother 74       deceased 25  . Stomach cancer Paternal Grandfather        unconfirmed; deceased 51  . Stomach cancer Paternal Aunt 44       deceased 31  . Colon cancer Maternal Aunt 68       also lung cancer  . Stomach cancer Maternal Uncle 10     Current Outpatient Medications:  .  Biotin 1000 MCG tablet, Take 1,000 mcg by mouth daily., Disp: , Rfl:  .  Cyanocobalamin (VITAMIN B 12 PO), Take by mouth., Disp: , Rfl:  .  levothyroxine (SYNTHROID, LEVOTHROID) 200 MCG tablet, Take 1 tablet (200 mcg  total) by mouth daily before breakfast., Disp: 30 tablet, Rfl: 2 .  Multiple Vitamin tablet, Take 2 tablets by mouth daily. , Disp: , Rfl:  .  OMEGA-3 FATTY ACIDS PO, Take 2 capsules by mouth daily. , Disp: , Rfl:  .  VITAMIN D, CHOLECALCIFEROL, PO, Take 1 tablet by mouth daily., Disp: , Rfl:  .  apixaban (ELIQUIS) 5 MG TABS tablet, Take 1 tablet (5 mg total) by mouth 2 (two) times daily. (Patient not taking: Reported on 10/03/2018), Disp: 60 tablet, Rfl: 0 .  APPLE CIDER VINEGAR PO, Take 1 tablet by mouth daily., Disp: , Rfl:  .  diphenhydrAMINE (BENADRYL) 25 MG tablet, Take 25 mg by mouth every 6 (six) hours as needed for itching or allergies., Disp: , Rfl:  .  DULoxetine (CYMBALTA) 30 MG capsule, Take 1 capsule (30 mg total) by mouth daily. For 7 days and then bid for rest of the month (Patient not taking: Reported on 10/03/2018), Disp: 59 capsule, Rfl: 0 .  lidocaine-prilocaine (EMLA) cream,  Apply to affected area once (Patient not taking: Reported on 10/03/2018), Disp: 30 g, Rfl: 3 .  loratadine (CLARITIN) 10 MG tablet, Take 10 mg by mouth daily., Disp: , Rfl:  .  LORazepam (ATIVAN) 0.5 MG tablet, Take 1 tablet (0.5 mg total) by mouth every 6 (six) hours as needed (Nausea or vomiting). (Patient not taking: Reported on 05/30/2018), Disp: 30 tablet, Rfl: 0 .  ondansetron (ZOFRAN) 8 MG tablet, Take 1 tablet (8 mg total) by mouth 2 (two) times daily as needed for refractory nausea / vomiting. Start on day 3 after chemotherapy. (Patient not taking: Reported on 05/30/2018), Disp: 30 tablet, Rfl: 1 .  prochlorperazine (COMPAZINE) 10 MG tablet, Take 1 tablet (10 mg total) by mouth every 6 (six) hours as needed (Nausea or vomiting). (Patient not taking: Reported on 05/30/2018), Disp: 30 tablet, Rfl: 1 .  sucralfate (CARAFATE) 1 g tablet, Take 1 tablet (1 g total) by mouth 3 (three) times daily before meals. (Patient not taking: Reported on 10/03/2018), Disp: 40 tablet, Rfl: 1 No current facility-administered medications for this visit.   Facility-Administered Medications Ordered in Other Visits:  .  heparin lock flush 100 unit/mL, 500 Units, Intracatheter, PRN, Sindy Guadeloupe, MD .  sodium chloride flush (NS) 0.9 % injection 10 mL, 10 mL, Intravenous, PRN, Sindy Guadeloupe, MD, 10 mL at 06/13/18 0844  Physical exam:  Vitals:   10/03/18 1317  BP: (!) 165/98  Pulse: 84  Resp: 18  Temp: 98 F (36.7 C)  TempSrc: Tympanic  SpO2: 98%  Weight: 206 lb 9.6 oz (93.7 kg)  Height: 5' 7"  (1.702 m)   Physical Exam  Constitutional: She is oriented to person, place, and time. She appears well-developed and well-nourished.  HENT:  Head: Normocephalic and atraumatic.  Eyes: Pupils are equal, round, and reactive to light. EOM are normal.  Neck: Normal range of motion.  Cardiovascular: Normal rate, regular rhythm and normal heart sounds.  Pulmonary/Chest: Effort normal and breath sounds normal.    Abdominal: Soft. Bowel sounds are normal. She exhibits distension. There is tenderness.  Neurological: She is alert and oriented to person, place, and time.  Skin: Skin is warm and dry.     CMP Latest Ref Rng & Units 10/03/2018  Glucose 70 - 99 mg/dL 110(H)  BUN 6 - 20 mg/dL 9  Creatinine 0.44 - 1.00 mg/dL 0.53  Sodium 135 - 145 mmol/L 139  Potassium 3.5 - 5.1 mmol/L 3.6  Chloride  98 - 111 mmol/L 104  CO2 22 - 32 mmol/L 27  Calcium 8.9 - 10.3 mg/dL 9.4  Total Protein 6.5 - 8.1 g/dL 7.2  Total Bilirubin 0.3 - 1.2 mg/dL 0.5  Alkaline Phos 38 - 126 U/L 124  AST 15 - 41 U/L 30  ALT 0 - 44 U/L 54(H)   CBC Latest Ref Rng & Units 10/03/2018  WBC 4.0 - 10.5 K/uL 3.2(L)  Hemoglobin 12.0 - 15.0 g/dL 12.4  Hematocrit 36.0 - 46.0 % 36.0  Platelets 150 - 400 K/uL 180      Assessment and plan- Patient is a 47 y.o. 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 She is here for routine surveillance of her colon cancer  Clinically she is doing well and there is no evidence of recurrence on todays exam.  There was no evidence of metastatic disease seen on her scans in July 2019. She will continue to get CT scans every 6 months  We will plan to take her port out at this time and touch base with DR. Pabon regarding this.  She is s/p RT for 2 weeks now. I will touch base with DR. Anna regarding surveillance colonoscopy.   I will see her back in 3 months with cbc, cmp and CEA   Visit Diagnosis 1. Cancer of sigmoid ([place])   2. Encounter for follow-up surveillance of colon cancer      Dr. Randa Evens, MD, MPH [facility] [phone] 10/07/2018 6:29 AM

## 2018-10-09 ENCOUNTER — Telehealth: Payer: Self-pay | Admitting: Gastroenterology

## 2018-10-09 ENCOUNTER — Telehealth: Payer: Self-pay | Admitting: *Deleted

## 2018-10-09 NOTE — Telephone Encounter (Signed)
Please call patient to schedule a colonoscopy with Dr Vicente Males. Patient has seen him in the office in the past.

## 2018-10-09 NOTE — Telephone Encounter (Signed)
Called surgeon office and spoke to [facility] and she said pt has appt 11/6 in office-it may be that they will remove port in office or if pt chooses to they can make appt for it to be removed in OR.

## 2018-10-10 ENCOUNTER — Other Ambulatory Visit: Payer: Self-pay

## 2018-10-10 DIAGNOSIS — Z08 Encounter for follow-up examination after completed treatment for malignant neoplasm: Secondary | ICD-10-CM

## 2018-10-10 DIAGNOSIS — C187 Malignant neoplasm of sigmoid colon: Secondary | ICD-10-CM

## 2018-10-10 DIAGNOSIS — Z85038 Personal history of other malignant neoplasm of large intestine: Secondary | ICD-10-CM

## 2018-10-10 NOTE — Telephone Encounter (Signed)
-----   Message from Jonathon Bellows, MD sent at 10/09/2018  7:42 AM EDT ----- Regina Patel   Inform patient she is due for colonoscopy and if ok please schedule   kiran   ----- Message ----- From: Sindy Guadeloupe, MD Sent: 10/07/2018   6:38 AM EDT To: Mikey College, NP, Jonathon Bellows, MD

## 2018-10-10 NOTE — Telephone Encounter (Signed)
Spoke with pt and discussed scheduling the colonoscopy procedure. The procedure has been scheduled and I have informed pt of procedure location and prior prep instructions. Pt is aware that she will receive printed instructions via mail.

## 2018-10-16 ENCOUNTER — Other Ambulatory Visit: Payer: Managed Care, Other (non HMO)

## 2018-10-16 ENCOUNTER — Other Ambulatory Visit: Payer: Self-pay | Admitting: Nurse Practitioner

## 2018-10-16 DIAGNOSIS — Z0000 Encounter for general adult medical examination without abnormal findings: Secondary | ICD-10-CM

## 2018-10-16 DIAGNOSIS — E538 Deficiency of other specified B group vitamins: Secondary | ICD-10-CM

## 2018-10-16 DIAGNOSIS — D6489 Other specified anemias: Secondary | ICD-10-CM

## 2018-10-18 ENCOUNTER — Other Ambulatory Visit: Payer: Self-pay | Admitting: Nurse Practitioner

## 2018-10-18 DIAGNOSIS — E034 Atrophy of thyroid (acquired): Secondary | ICD-10-CM

## 2018-10-18 MED ORDER — LEVOTHYROXINE SODIUM 175 MCG PO TABS: 175.0000 ug | ORAL_TABLET | Freq: Every day | ORAL | 5 refills | Status: DC

## 2018-10-20 ENCOUNTER — Other Ambulatory Visit: Payer: Self-pay

## 2018-10-20 ENCOUNTER — Ambulatory Visit (INDEPENDENT_AMBULATORY_CARE_PROVIDER_SITE_OTHER): Payer: Managed Care, Other (non HMO) | Admitting: Nurse Practitioner

## 2018-10-20 ENCOUNTER — Encounter: Payer: Self-pay | Admitting: Nurse Practitioner

## 2018-10-20 VITALS — BP 151/75 | HR 91 | Temp 98.2°F | Ht 67.0 in | Wt 206.4 lb

## 2018-10-20 DIAGNOSIS — E039 Hypothyroidism, unspecified: Secondary | ICD-10-CM

## 2018-10-20 DIAGNOSIS — Z1239 Encounter for other screening for malignant neoplasm of breast: Secondary | ICD-10-CM

## 2018-10-20 DIAGNOSIS — Z0000 Encounter for general adult medical examination without abnormal findings: Secondary | ICD-10-CM

## 2018-10-20 HISTORY — PX: PORT-A-CATH REMOVAL: SHX5289

## 2018-10-20 NOTE — Progress Notes (Signed)
Subjective:    Patient ID: Regina Patel, female    DOB: 11-Sep-1971, 47 y.o.   MRN: [id number]  Regina Patel is a 47 y.o. female presenting on 10/20/2018 for Annual Exam   HPI Annual Physical Exam Patient has been feeling well.  They have no acute concerns today. Sleeps 7-8 hours per night usually interrupted for restroom. Goes to sleep quickly most of the time.  HEALTH MAINTENANCE: Weight/BMI: stable Physical activity: walking occasionally - has not had regular routine recently.  Was walking M-female regularly.  Stays active.   Diet: generally healthy, but probably eats too many sweets Seatbelt: always Sunscreen: not regularly, affects face with rash PAP: hysterectomy, Partial LEFT ovary.  Removed RIGHT for tumor.  Night sweats.   Mammogram: desires Colon Cancer Screen: done repeat on 11/10/2018 HIV: neg Optometry: regular - due again Dentistry: regular  VACCINES: Tetanus: 2015 Influenza: Declines  Hypothyroidism - Pt states she is taking her levothyroxine 200 mcg in the am at least 1 hour before eating or drinking and taking other medicines.  - She is not currently symptomatic.   - She did see endocrinology, who has deemed her variation in TSH to be transient and she can continue follow-up with PCP. - She denies excess energy, weight changes, heart racing, heart palpitations, heat and cold intolerance, changes in hair/skin/nails, and lower leg swelling.  - She does not have any compressive symptoms to include difficulty swallowing, globus sensation, or difficulty breathing when lying flat.   Recent Labs    05/09/18 0844 10/16/18 0813  TSH 44.829* 0.24*     Social History   Tobacco Use  . Smoking status: Never Smoker  . Smokeless tobacco: Never Used  Substance Use Topics  . Alcohol use: No    Alcohol/week: 0.0 standard drinks  . Drug use: No    Review of Systems Per HPI unless specifically indicated above     Objective:    BP (!) 151/75   Pulse 91   Temp 98.2  F (36.8 C) (Oral)   Ht 5\' 7"  (1.702 m)   Wt 206 lb 6.4 oz (93.6 kg)   BMI 32.33 kg/m   Wt Readings from Last 3 Encounters:  10/20/18 206 lb 6.4 oz (93.6 kg)  10/03/18 206 lb 9.6 oz (93.7 kg)  07/20/18 212 lb 1.3 oz (96.2 kg)    Physical Exam  Constitutional: She is oriented to person, place, and time. She appears well-developed and well-nourished. No distress.  HENT:  Head: Normocephalic and atraumatic.  Right Ear: External ear normal.  Left Ear: External ear normal.  Nose: Nose normal.  Mouth/Throat: Oropharynx is clear and moist.  Eyes: Pupils are equal, round, and reactive to light. Conjunctivae are normal.  Neck: Normal range of motion. Neck supple. No JVD present. No tracheal deviation present. No thyromegaly present.  Cardiovascular: Normal rate, regular rhythm, normal heart sounds and intact distal pulses. Exam reveals no gallop and no friction rub.  No murmur heard. Pulmonary/Chest: Effort normal and breath sounds normal. No respiratory distress.  Breast - Normal exam w/ symmetric breasts, no mass, no nipple discharge, no skin changes or tenderness.    Abdominal: Soft. Bowel sounds are normal. She exhibits no distension. There is no hepatosplenomegaly. There is no tenderness.  Musculoskeletal: Normal range of motion.  Lymphadenopathy:    She has no cervical adenopathy.  Neurological: She is alert and oriented to person, place, and time. No cranial nerve deficit.  Skin: Skin is warm and dry. Capillary refill takes  less than 2 seconds.  Psychiatric: She has a normal mood and affect. Her behavior is normal. Judgment and thought content normal.  Nursing note and vitals reviewed.   Results for orders placed or performed in visit on 10/16/18  TSH  Result Value Ref Range   TSH 0.24 (L) mIU/L  CBC with Differential/Platelet  Result Value Ref Range   WBC 3.0 (L) 3.8 - 10.8 Thousand/uL   RBC 4.22 3.80 - 5.10 Million/uL   Hemoglobin 12.8 11.7 - 15.5 g/dL   HCT 38.1 35.0 -  45.0 %   MCV 90.3 80.0 - 100.0 fL   MCH 30.3 27.0 - 33.0 pg   MCHC 33.6 32.0 - 36.0 g/dL   RDW 14.5 11.0 - 15.0 %   Platelets 176 140 - 400 Thousand/uL   MPV 10.2 7.5 - 12.5 fL   Neutro Abs 1,755 1,500 - 7,800 cells/uL   Lymphs Abs 831 (L) 850 - 3,900 cells/uL   WBC mixed population 324 200 - 950 cells/uL   Eosinophils Absolute 81 15 - 500 cells/uL   Basophils Absolute 9 0 - 200 cells/uL   Neutrophils Relative % 58.5 %   Total Lymphocyte 27.7 %   Monocytes Relative 10.8 %   Eosinophils Relative 2.7 %   Basophils Relative 0.3 %  COMPLETE METABOLIC PANEL WITH GFR  Result Value Ref Range   Glucose, Bld 92 65 - 99 mg/dL   BUN 10 7 - 25 mg/dL   Creat 0.62 0.50 - 1.10 mg/dL   GFR, Est Non African American 107 > OR = 60 mL/min/1.79m2   GFR, Est African American 124 > OR = 60 mL/min/1.34m2   BUN/Creatinine Ratio NOT APPLICABLE 6 - 22 (calc)   Sodium 140 135 - 146 mmol/L   Potassium 4.6 3.5 - 5.3 mmol/L   Chloride 103 98 - 110 mmol/L   CO2 28 20 - 32 mmol/L   Calcium 9.7 8.6 - 10.2 mg/dL   Total Protein 6.8 6.1 - 8.1 g/dL   Albumin 4.1 3.6 - 5.1 g/dL   Globulin 2.7 1.9 - 3.7 g/dL (calc)   AG Ratio 1.5 1.0 - 2.5 (calc)   Total Bilirubin 0.4 0.2 - 1.2 mg/dL   Alkaline phosphatase (APISO) 108 33 - 115 U/L   AST 34 10 - 35 U/L   ALT 53 (H) 6 - 29 U/L  Hemoglobin A1c  Result Value Ref Range   Hgb A1c MFr Bld 5.4 <5.7 % of total Hgb   Mean Plasma Glucose 108 (calc)   eAG (mmol/L) 6.0 (calc)  Lipid panel  Result Value Ref Range   Cholesterol 228 (H) <200 mg/dL   HDL 47 (L) >50 mg/dL   Triglycerides 196 (H) <150 mg/dL   LDL Cholesterol (Calc) 147 (H) mg/dL (calc)   Total CHOL/HDL Ratio 4.9 <5.0 (calc)   Non-HDL Cholesterol (Calc) 181 (H) <130 mg/dL (calc)      Assessment & Plan:   Problem List Items Addressed This Visit      Endocrine   Hypothyroidism Improved TSH, but variations in control likely secondary to chemo and radiation for colon cancer as there is no other  explanation for this variance.  TSH at [facility] clinic 1.105 on with Free T4 1.41 10/02/2018.  Add on Free T4 to 10/28 collection.  Will likely continue with dose reduction to 175 mcg daily, but dependent upon Free T4 value given fluctuations in TSH.  Patient verbalizes understanding. - Followup TSH and FreeT4 in 6 weeks. - Followup office visit  in about 6 months.    Other Visit Diagnoses    Encounter for annual physical exam    -  Primary   Breast cancer screening       Relevant Orders   MM DIGITAL SCREENING BILATERAL     Physical exam with no new findings.  Well adult with no acute concerns.  Plan: 1. Obtain health maintenance screenings as above according to age. - Increase physical activity to 30 minutes most days of the week.  - Eat healthy diet high in vegetables and fruits; low in refined carbohydrates. - Mammogram for breast cancer screening. - Labs reviewed with patient in clinic.   2. Return 1 year for annual physical.    Follow up plan: Return in about 1 year (around 10/21/2019) for annual physical.  Cassell Smiles, DNP, AGPCNP-BC Adult Gerontology Primary Care Nurse Practitioner [facility] 10/20/2018, 3:07 PM

## 2018-10-20 NOTE — Patient Instructions (Addendum)
Elfredia Nevins,   Thank you for coming in to clinic today.  1. Continue working on reintroducing regular physical activity to your lifestyle. 2. Reduce some sweets and limit to about 1-2 times per week.  3. Continue regularly scheduled followup with other docs after your cancer treatment.  4. For thyroid, we are adding on Free T4 lab.  Most likely, you will continue with dose reduction to 175 mcg once daily.    5. Your mammogram order has been placed.  Call the Scheduling phone number at [phone] to schedule your mammogram at your convenience.  You can choose to go to either location listed below.  Let the scheduler know which location you prefer.  [place]  [place], [place] [address]   [facility] Outpatient Radiology [address] [place], [place] [address]   Please schedule a follow-up appointment with Cassell Smiles, AGNP. Return in about 1 year (around 10/21/2019) for annual physical.  If you have any other questions or concerns, please feel free to call the clinic or send a message through [facility]. You may also schedule an earlier appointment if necessary.  You will receive a survey after today's visit either digitally by e-mail or paper by [employer]. Your experiences and feedback matter to [place].  Please respond so we know how we are doing as we provide care for you.   Cassell Smiles, DNP, AGNP-BC Adult Gerontology Nurse Practitioner [place]

## 2018-10-23 ENCOUNTER — Ambulatory Visit
Admission: RE | Admit: 2018-10-23 | Discharge: 2018-10-23 | Disposition: A | Discharge status: Home / Self Care | Payer: Managed Care, Other (non HMO) | Source: Ambulatory Visit | Attending: Radiation Oncology | Admitting: Radiation Oncology

## 2018-10-23 ENCOUNTER — Other Ambulatory Visit: Payer: Self-pay

## 2018-10-23 ENCOUNTER — Encounter: Payer: Self-pay | Admitting: Radiation Oncology

## 2018-10-23 ENCOUNTER — Other Ambulatory Visit: Payer: Self-pay | Admitting: Nurse Practitioner

## 2018-10-23 VITALS — BP 161/94 | HR 90 | Temp 98.1°F | Resp 18 | Wt 206.2 lb

## 2018-10-23 DIAGNOSIS — C187 Malignant neoplasm of sigmoid colon: Secondary | ICD-10-CM | POA: Diagnosis not present

## 2018-10-23 DIAGNOSIS — Z1231 Encounter for screening mammogram for malignant neoplasm of breast: Secondary | ICD-10-CM

## 2018-10-23 DIAGNOSIS — Z510 Encounter for antineoplastic radiation therapy: Secondary | ICD-10-CM | POA: Diagnosis not present

## 2018-10-23 DIAGNOSIS — R928 Other abnormal and inconclusive findings on diagnostic imaging of breast: Secondary | ICD-10-CM

## 2018-10-23 NOTE — Progress Notes (Signed)
Radiation Oncology Follow up Note  Name: Regina Patel   Date:   10/23/2018 MRN:  [id number] DOB: Jan 31, 1971    This 47 y.o. 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 She is scheduled for colonoscopy later this month and port removal..she's not had any recent imaging.  COMPLICATIONS OF TREATMENT: none  FOLLOW UP COMPLIANCE: keeps appointments   PHYSICAL EXAM:  BP (!) 161/94 (BP Location: Left Arm, Patient Position: Sitting)   Pulse 90   Temp 98.1 F (36.7 C) (Tympanic)   Resp 18   Wt 206 lb 3.9 oz (93.6 kg)   BMI 32.30 kg/m  Well-developed well-nourished patient in NAD. HEENT reveals PERLA, EOMI, discs not visualized.  Oral cavity is clear. No oral mucosal lesions are identified. Neck is clear without evidence of cervical or supraclavicular adenopathy. Lungs are clear to A&P. Cardiac examination is essentially unremarkable with regular rate and rhythm without murmur rub or thrill. Abdomen is benign with no organomegaly or masses noted. Motor sensory and DTR levels are equal and symmetric in the upper and lower extremities. Cranial nerves II through XII are grossly intact. Proprioception is intact. No peripheral adenopathy or edema is identified. No motor or sensory levels are noted. Crude visual fields are within normal range.  RADIOLOGY RESULTS: no current films for review  PLAN: t the present time she is doing well  recovering from FOLFOX chemotherapy plus radiation therapy. She is scheduled for colonoscopy later this month as well as having her port removed. Also CT surveillance will be ordered by Dr. Judithann Graves. Patient desires to be followed by Dr. Janese Banks I'm going to discontinue follow-up care at this time. Patient is to call with any concerns at any time.      Noreene Filbert, MD

## 2018-10-24 LAB — COMPLETE METABOLIC PANEL WITH GFR
AG Ratio: 1.5 (calc) (ref 1.0–2.5)
ALT: 53 U/L — ABNORMAL HIGH (ref 6–29)
AST: 34 U/L (ref 10–35)
Albumin: 4.1 g/dL (ref 3.6–5.1)
Alkaline phosphatase (APISO): 108 U/L (ref 33–115)
BUN: 10 mg/dL (ref 7–25)
CO2: 28 mmol/L (ref 20–32)
Calcium: 9.7 mg/dL (ref 8.6–10.2)
Chloride: 103 mmol/L (ref 98–110)
Creat: 0.62 mg/dL (ref 0.50–1.10)
GFR, Est African American: 124 mL/min/{1.73_m2} (ref >=60)
GFR, Est Non African American: 107 mL/min/{1.73_m2} (ref >=60)
Globulin: 2.7 g/dL (calc) (ref 1.9–3.7)
Glucose, Bld: 92 mg/dL (ref 65–99)
Potassium: 4.6 mmol/L (ref 3.5–5.3)
Sodium: 140 mmol/L (ref 135–146)
Total Bilirubin: 0.4 mg/dL (ref 0.2–1.2)
Total Protein: 6.8 g/dL (ref 6.1–8.1)

## 2018-10-24 LAB — HEMOGLOBIN A1C
Hgb A1c MFr Bld: 5.4 % of total Hgb (ref <5.7)
Mean Plasma Glucose: 108 (calc)
eAG (mmol/L): 6 (calc)

## 2018-10-24 LAB — TEST AUTHORIZATION

## 2018-10-24 LAB — CBC WITH DIFFERENTIAL/PLATELET
Basophils Absolute: 9 cells/uL (ref 0–200)
Basophils Relative: 0.3 %
Eosinophils Absolute: 81 cells/uL (ref 15–500)
Eosinophils Relative: 2.7 %
HCT: 38.1 % (ref 35.0–45.0)
Hemoglobin: 12.8 g/dL (ref 11.7–15.5)
Lymphs Abs: 831 cells/uL — ABNORMAL LOW (ref 850–3900)
MCH: 30.3 pg (ref 27.0–33.0)
MCHC: 33.6 g/dL (ref 32.0–36.0)
MCV: 90.3 fL (ref 80.0–100.0)
MPV: 10.2 fL (ref 7.5–12.5)
Monocytes Relative: 10.8 %
Neutro Abs: 1755 cells/uL (ref 1500–7800)
Neutrophils Relative %: 58.5 %
Platelets: 176 10*3/uL (ref 140–400)
RBC: 4.22 10*6/uL (ref 3.80–5.10)
RDW: 14.5 % (ref 11.0–15.0)
Total Lymphocyte: 27.7 %
WBC mixed population: 324 cells/uL (ref 200–950)
WBC: 3 10*3/uL — ABNORMAL LOW (ref 3.8–10.8)

## 2018-10-24 LAB — LIPID PANEL
Cholesterol: 228 mg/dL — ABNORMAL HIGH (ref <200)
HDL: 47 mg/dL — ABNORMAL LOW (ref >50)
LDL Cholesterol (Calc): 147 mg/dL (calc) — ABNORMAL HIGH
Non-HDL Cholesterol (Calc): 181 mg/dL (calc) — ABNORMAL HIGH (ref <130)
Total CHOL/HDL Ratio: 4.9 (calc) (ref <5.0)
Triglycerides: 196 mg/dL — ABNORMAL HIGH (ref <150)

## 2018-10-24 LAB — TSH: TSH: 0.24 mIU/L — ABNORMAL LOW

## 2018-10-24 LAB — T4, FREE: Free T4: 1.8 ng/dL (ref 0.8–1.8)

## 2018-10-25 ENCOUNTER — Encounter: Payer: Self-pay | Admitting: Surgery

## 2018-10-25 ENCOUNTER — Other Ambulatory Visit: Payer: Self-pay

## 2018-10-25 ENCOUNTER — Ambulatory Visit: Payer: Managed Care, Other (non HMO) | Admitting: Surgery

## 2018-10-25 VITALS — BP 146/92 | HR 99 | Temp 98.4°F | Ht 67.0 in | Wt 208.0 lb

## 2018-10-25 DIAGNOSIS — K6389 Other specified diseases of intestine: Secondary | ICD-10-CM | POA: Diagnosis not present

## 2018-10-25 NOTE — Progress Notes (Signed)
Outpatient Surgical Follow Up  10/25/2018  Regina Patel is an 47 y.o. female.   Chief Complaint  Patient presents with  . Follow-up    Discuss port removal -last chemo 06/2018    HPI: 47 yo female with a history of sigmoid cancer status post resection and chemotherapy and radiation therapy.  She has completed her chemotherapy and is feeling very well.  No fevers or chills no weight loss.  No melena.  Taking p.o. well.  She has a scheduled colonoscopy schedule in a couple weeks.  Past Medical History:  Diagnosis Date  . Anemia   . Cancer of sigmoid colon ([place]) 11/11/2017   Partial sigmoid colon resection.   . Diverticulosis   . Genetic testing 12/14/2017   Common Cancers panel (47 genes) @ [facility] - No pathogenic mutations detected  . Headache   . Heart murmur   . Hyperlipidemia   . Hypothyroidism   . Thyroid disease     Past Surgical History:  Procedure Laterality Date  . APPENDECTOMY     possible during hysterectomy  . BOWEL RESECTION N/A 11/11/2017   Procedure: SMALL BOWEL RESECTION;  Surgeon: Jules Husbands, MD;  Location: [facility] ORS;  Service: General;  Laterality: N/A;  . FLEXIBLE SIGMOIDOSCOPY N/A 11/07/2017   Procedure: Beryle Quant;  Surgeon: Jonathon Bellows, MD;  Location: [facility] ENDOSCOPY;  Service: Gastroenterology;  Laterality: N/A;  . LAPAROSCOPIC SIGMOID COLECTOMY N/A 11/11/2017   Procedure: LAPAROSCOPIC SIGMOID COLECTOMY;  Surgeon: Jules Husbands, MD;  Location: [facility] ORS;  Service: General;  Laterality: N/A;  . MOUTH SURGERY    . PORTACATH PLACEMENT Right 11/29/2017   Procedure: INSERTION PORT-A-CATH;  Surgeon: Jules Husbands, MD;  Location: [place] [facility];  Service: General;  Laterality: Right;  May do local and IV sedation. Please have U/S  . TOTAL ABDOMINAL HYSTERECTOMY  05/20/2014   ooperectomy unilateral    Family History  Problem Relation Age of Onset  . Breast cancer Mother 64       currently 46  . Hypertension Mother   . Thyroid disease  Brother   . Hernia Brother   . Thyroid cancer Maternal Grandmother 27       deceased 73  . Stomach cancer Paternal Grandfather        unconfirmed; deceased 34  . Stomach cancer Paternal Aunt 66       deceased 58  . Colon cancer Maternal Aunt 68       also lung cancer  . Stomach cancer Maternal Uncle 1    Social History:  reports that she has never smoked. She has never used smokeless tobacco. She reports that she does not drink alcohol or use drugs.  Allergies: No Known Allergies  Medications reviewed.    ROS Full ROS performed and is otherwise negative other than what is stated in HPI   BP (!) 146/92   Pulse 99   Temp 98.4 F (36.9 C) (Temporal)   Ht 5\' 7"  (1.702 m)   Wt 208 lb (94.3 kg)   SpO2 99%   BMI 32.58 kg/m   Physical Exam  Constitutional: She is oriented to person, place, and time. She appears well-developed and well-nourished. No distress.  HENT:  Head: Normocephalic.  Neck: Neck supple. No JVD present. No tracheal deviation present. No thyromegaly present.  Pulmonary/Chest: Effort normal. No respiratory distress.  Right port in place, no infection  Abdominal: Soft. She exhibits no distension and no mass. There is no tenderness. There is no rebound and  no guarding. No hernia.  Musculoskeletal: Normal range of motion. She exhibits no edema.  Lymphadenopathy:    She has no cervical adenopathy.  Neurological: She is alert and oriented to person, place, and time. She displays normal reflexes. No cranial nerve deficit or sensory deficit. She exhibits normal muscle tone. Coordination normal.  Skin: Skin is warm and dry. Capillary refill takes less than 2 seconds. She is not diaphoretic.  Psychiatric: She has a normal mood and affect. Her behavior is normal. Judgment and thought content normal.  Nursing note and vitals reviewed.   Assessment/Plan: 47 year old female with history of colon cancer now wishes her port to be removed.  Procedure discussed with the  patient in detail.  Risk benefit and possible implications.  She does have an upcoming colonoscopy in 2 weeks and we will wait to make sure there is no recurrence I will schedule the port removal in the office under local anesthetic.  All questions were answered  Greater than 50% of the 25 minutes  visit was spent in counseling/coordination of care   Caroleen Hamman, MD [place] Surgeon

## 2018-10-25 NOTE — Patient Instructions (Signed)
Please schedule port removal after 11/10/18 in office.

## 2018-11-07 ENCOUNTER — Inpatient Hospital Stay: Payer: Managed Care, Other (non HMO) | Attending: Oncology

## 2018-11-07 DIAGNOSIS — C187 Malignant neoplasm of sigmoid colon: Secondary | ICD-10-CM | POA: Insufficient documentation

## 2018-11-07 DIAGNOSIS — Z452 Encounter for adjustment and management of vascular access device: Secondary | ICD-10-CM | POA: Diagnosis not present

## 2018-11-07 DIAGNOSIS — Z95828 Presence of other vascular implants and grafts: Secondary | ICD-10-CM

## 2018-11-07 MED ORDER — SODIUM CHLORIDE 0.9% FLUSH
10.0000 mL | Freq: Once | INTRAVENOUS | Status: AC
  Administered 2018-11-07: 10 mL via INTRAVENOUS
  Filled 2018-11-07: qty 10

## 2018-11-07 MED ORDER — HEPARIN SOD (PORK) LOCK FLUSH 100 UNIT/ML IV SOLN
500.0000 [IU] | Freq: Once | INTRAVENOUS | Status: AC
  Administered 2018-11-07: 500 [IU] via INTRAVENOUS
  Filled 2018-11-07: qty 5

## 2018-11-10 ENCOUNTER — Encounter: Payer: Self-pay | Admitting: Certified Registered Nurse Anesthetist

## 2018-11-10 ENCOUNTER — Ambulatory Visit
Admission: RE | Admit: 2018-11-10 | Discharge: 2018-11-10 | Disposition: A | Discharge status: Home / Self Care | Payer: Managed Care, Other (non HMO) | Source: Ambulatory Visit | Attending: Gastroenterology | Admitting: Gastroenterology

## 2018-11-10 ENCOUNTER — Encounter
Admission: RE | Disposition: A | Discharge status: Home / Self Care | Payer: Self-pay | Source: Ambulatory Visit | Attending: Gastroenterology

## 2018-11-10 ENCOUNTER — Ambulatory Visit: Payer: Managed Care, Other (non HMO) | Admitting: Certified Registered Nurse Anesthetist

## 2018-11-10 DIAGNOSIS — R51 Headache: Secondary | ICD-10-CM | POA: Insufficient documentation

## 2018-11-10 DIAGNOSIS — E039 Hypothyroidism, unspecified: Secondary | ICD-10-CM | POA: Insufficient documentation

## 2018-11-10 DIAGNOSIS — Z08 Encounter for follow-up examination after completed treatment for malignant neoplasm: Secondary | ICD-10-CM | POA: Diagnosis not present

## 2018-11-10 DIAGNOSIS — Z980 Intestinal bypass and anastomosis status: Secondary | ICD-10-CM | POA: Insufficient documentation

## 2018-11-10 DIAGNOSIS — E785 Hyperlipidemia, unspecified: Secondary | ICD-10-CM | POA: Insufficient documentation

## 2018-11-10 DIAGNOSIS — R011 Cardiac murmur, unspecified: Secondary | ICD-10-CM | POA: Insufficient documentation

## 2018-11-10 DIAGNOSIS — Z1211 Encounter for screening for malignant neoplasm of colon: Secondary | ICD-10-CM | POA: Insufficient documentation

## 2018-11-10 DIAGNOSIS — Z79899 Other long term (current) drug therapy: Secondary | ICD-10-CM | POA: Diagnosis not present

## 2018-11-10 DIAGNOSIS — D122 Benign neoplasm of ascending colon: Secondary | ICD-10-CM | POA: Diagnosis not present

## 2018-11-10 DIAGNOSIS — C187 Malignant neoplasm of sigmoid colon: Secondary | ICD-10-CM

## 2018-11-10 DIAGNOSIS — Z85038 Personal history of other malignant neoplasm of large intestine: Secondary | ICD-10-CM | POA: Insufficient documentation

## 2018-11-10 HISTORY — PX: COLONOSCOPY WITH PROPOFOL: SHX5780

## 2018-11-10 SURGERY — COLONOSCOPY WITH PROPOFOL
Anesthesia: General

## 2018-11-10 MED ORDER — SODIUM CHLORIDE 0.9 % IV SOLN
INTRAVENOUS | Status: DC
  Administered 2018-11-10: 1000 mL via INTRAVENOUS

## 2018-11-10 MED ORDER — PROPOFOL 500 MG/50ML IV EMUL
INTRAVENOUS | Status: AC
  Filled 2018-11-10: qty 50

## 2018-11-10 MED ORDER — LIDOCAINE HCL (PF) 1 % IJ SOLN
2.0000 mL | Freq: Once | INTRAMUSCULAR | Status: AC
  Administered 2018-11-10: 0.3 mL via INTRADERMAL

## 2018-11-10 MED ORDER — PROPOFOL 10 MG/ML IV BOLUS
INTRAVENOUS | Status: AC
  Filled 2018-11-10: qty 20

## 2018-11-10 MED ORDER — PROPOFOL 10 MG/ML IV BOLUS
INTRAVENOUS | Status: DC | PRN
  Administered 2018-11-10: 20 mg via INTRAVENOUS
  Administered 2018-11-10: 10 mg via INTRAVENOUS
  Administered 2018-11-10: 30 mg via INTRAVENOUS

## 2018-11-10 MED ORDER — LIDOCAINE HCL (CARDIAC) PF 100 MG/5ML IV SOSY
PREFILLED_SYRINGE | INTRAVENOUS | Status: DC | PRN
  Administered 2018-11-10: 50 mg via INTRAVENOUS

## 2018-11-10 MED ORDER — PROPOFOL 500 MG/50ML IV EMUL
INTRAVENOUS | Status: DC | PRN
  Administered 2018-11-10: 100 ug/kg/min via INTRAVENOUS

## 2018-11-10 NOTE — Transfer of Care (Signed)
Immediate Anesthesia Transfer of Care Note  Patient: Regina Patel  Procedure(s) Performed: COLONOSCOPY WITH PROPOFOL (N/A )  Patient Location: PACU  Anesthesia Type:MAC  Level of Consciousness: awake, alert  and oriented  Airway & Oxygen Therapy: Patient Spontanous Breathing and Patient connected to nasal cannula oxygen  Post-op Assessment: Report given to RN and Post -op Vital signs reviewed and stable  Post vital signs: Reviewed and stable  Last Vitals:  Vitals Value Taken Time  BP    Temp    Pulse    Resp    SpO2      Last Pain:  Vitals:   11/10/18 0823  TempSrc: Tympanic  PainSc: 0-No pain         Complications: No apparent anesthesia complications

## 2018-11-10 NOTE — Op Note (Signed)
[facility] Gastroenterology Patient Name: Regina Patel Procedure Date: 11/10/2018 8:51 AM MRN: [id number] Account #: [id number] Date of Birth: [birth year] Admit Type: Outpatient Age: 47 Room: [facility] ENDO ROOM 4 Gender: Female Note Status: Finalized Procedure:            Colonoscopy Indications:          High risk colon cancer surveillance: Personal history                        of colon cancer Providers:            Jonathon Bellows MD, MD Referring MD:         Mikey College (Referring MD) Medicines:            Monitored Anesthesia Care Complications:        No immediate complications. Procedure:            Pre-Anesthesia Assessment:                       - Prior to the procedure, a History and Physical was                        performed, and patient medications, allergies and                        sensitivities were reviewed. The patient's tolerance of                        previous anesthesia was reviewed.                       - The risks and benefits of the procedure and the                        sedation options and risks were discussed with the                        patient. All questions were answered and informed                        consent was obtained.                       - ASA Grade Assessment: II - A patient with mild                        systemic disease.                       After obtaining informed consent, the colonoscope was                        passed under direct vision. Throughout the procedure,                        the patient's blood pressure, pulse, and oxygen                        saturations were monitored continuously. The                        Colonoscope  was introduced through the anus and                        advanced to the the cecum, identified by the                        appendiceal orifice, IC valve and transillumination.                        The colonoscopy was performed with ease. The patient              tolerated the procedure well. The quality of the bowel                        preparation was good. Findings:      The perianal and digital rectal examinations were normal.      A 3 mm polyp was found in the ascending colon. The polyp was sessile.       The polyp was removed with a cold biopsy forceps. Resection and       retrieval were complete.      There was evidence of a prior end-to-side colo-rectal anastomosis in the       sigmoid colon. This was patent and was characterized by healthy       appearing mucosa. The anastomosis was traversed.      The exam was otherwise without abnormality on direct and retroflexion       views. Impression:           - One 3 mm polyp in the ascending colon, removed with a                        cold biopsy forceps. Resected and retrieved.                       - Patent end-to-side colo-rectal anastomosis,                        characterized by healthy appearing mucosa.                       - The examination was otherwise normal on direct and                        retroflexion views. Recommendation:       - Discharge patient to home (with escort).                       - Resume previous diet.                       - Continue present medications.                       - Await pathology results.                       - Repeat colonoscopy in 3 years for surveillance. Procedure Code(s):    --- Professional ---                       [phone], Colonoscopy, flexible; with biopsy, single or  multiple Diagnosis Code(s):    --- Professional ---                       [phone], Personal history of other malignant neoplasm                        of large intestine                       D12.2, Benign neoplasm of ascending colon                       Z98.0, Intestinal bypass and anastomosis status CPT copyright 2018 American Medical Association. All rights reserved. The codes documented in this report are preliminary and upon coder  review may  be revised to meet current compliance requirements. Jonathon Bellows, MD Jonathon Bellows MD, MD 11/10/2018 9:18:37 AM This report has been signed electronically. Number of Addenda: 0 Note Initiated On: 11/10/2018 8:51 AM Scope Withdrawal Time: 0 hours 9 minutes 11 seconds  Total Procedure Duration: 0 hours 10 minutes 59 seconds       [facility]

## 2018-11-10 NOTE — Anesthesia Post-op Follow-up Note (Signed)
Anesthesia QCDR form completed.        

## 2018-11-10 NOTE — H&P (Signed)
Jonathon Bellows, MD [address], [place], [place], [place], [address] [address], [place], [place], [place], [address] Phone: [phone]  Fax: [phone]  Primary Care Physician:  Mikey College, NP   Pre-Procedure History & Physical: HPI:  Regina Patel is a 47 y.o. female is here for an colonoscopy.   Past Medical History:  Diagnosis Date  . Anemia   . Cancer of sigmoid colon ([place]) 11/11/2017   Partial sigmoid colon resection.   . Diverticulosis   . Genetic testing 12/14/2017   Common Cancers panel (47 genes) @ [facility] - No pathogenic mutations detected  . Headache   . Heart murmur   . Hyperlipidemia   . Hypothyroidism   . Thyroid disease     Past Surgical History:  Procedure Laterality Date  . APPENDECTOMY     possible during hysterectomy  . BOWEL RESECTION N/A 11/11/2017   Procedure: SMALL BOWEL RESECTION;  Surgeon: Jules Husbands, MD;  Location: [facility] ORS;  Service: General;  Laterality: N/A;  . FLEXIBLE SIGMOIDOSCOPY N/A 11/07/2017   Procedure: Beryle Quant;  Surgeon: Jonathon Bellows, MD;  Location: [facility] ENDOSCOPY;  Service: Gastroenterology;  Laterality: N/A;  . LAPAROSCOPIC SIGMOID COLECTOMY N/A 11/11/2017   Procedure: LAPAROSCOPIC SIGMOID COLECTOMY;  Surgeon: Jules Husbands, MD;  Location: [facility] ORS;  Service: General;  Laterality: N/A;  . MOUTH SURGERY    . PORTACATH PLACEMENT Right 11/29/2017   Procedure: INSERTION PORT-A-CATH;  Surgeon: Jules Husbands, MD;  Location: [facility];  Service: General;  Laterality: Right;  May do local and IV sedation. Please have U/S  . TOTAL ABDOMINAL HYSTERECTOMY  05/20/2014   ooperectomy unilateral    Prior to Admission medications   Medication Sig Start Date End Date Taking? Authorizing Provider  APPLE CIDER VINEGAR PO Take 1 tablet by mouth daily.    [provider]  Biotin 1000 MCG tablet Take 1,000 mcg by mouth daily.    [provider]  Cyanocobalamin (VITAMIN B 12 PO) Take by  mouth.    [provider]  levothyroxine (SYNTHROID, LEVOTHROID) 175 MCG tablet Take 1 tablet (175 mcg total) by mouth daily before breakfast. Patient taking differently: Take 200 mcg by mouth daily before breakfast.  10/18/18   Mikey College, NP  Multiple Vitamin tablet Take 2 tablets by mouth daily.     [provider]  OMEGA-3 FATTY ACIDS PO Take 2 capsules by mouth daily.     [provider]  VITAMIN D, CHOLECALCIFEROL, PO Take 1 tablet by mouth daily.    [provider]    Allergies as of 10/10/2018  . (No Known Allergies)    Family History  Problem Relation Age of Onset  . Breast cancer Mother 63       currently 79  . Hypertension Mother   . Thyroid disease Brother   . Hernia Brother   . Thyroid cancer Maternal Grandmother 52       deceased 50  . Stomach cancer Paternal Grandfather        unconfirmed; deceased 57  . Stomach cancer Paternal Aunt 24       deceased 98  . Colon cancer Maternal Aunt 68       also lung cancer  . Stomach cancer Maternal Uncle 72    Social History   Socioeconomic History  . Marital status: Married    Spouse name: Not on file  . Number of children: Not on file  . Years of education: Not on  file  . Highest education level: Not on file  Occupational History  . Not on file  Social Needs  . Financial resource strain: Not on file  . Food insecurity:    Worry: Not on file    Inability: Not on file  . Transportation needs:    Medical: Not on file    Non-medical: Not on file  Tobacco Use  . Smoking status: Never Smoker  . Smokeless tobacco: Never Used  Substance and Sexual Activity  . Alcohol use: No    Alcohol/week: 0.0 standard drinks  . Drug use: No  . Sexual activity: Yes  Lifestyle  . Physical activity:    Days per week: Not on file    Minutes per session: Not on file  . Stress: Not on file  Relationships  . Social connections:    Talks on phone: Not on file    Gets together: Not on  file    Attends religious service: Not on file    Active member of club or organization: Not on file    Attends meetings of clubs or organizations: Not on file    Relationship status: Not on file  . Intimate partner violence:    Fear of current or ex partner: Not on file    Emotionally abused: Not on file    Physically abused: Not on file    Forced sexual activity: Not on file  Other Topics Concern  . Not on file  Social History Narrative  . Not on file    Review of Systems: See HPI, otherwise negative ROS  Physical Exam: BP (!) 149/90   Pulse 84   Temp (!) 96.8 F (36 C) (Tympanic)   Resp 20   Ht 5\' 7"  (1.702 m)   Wt 94.3 kg   SpO2 100%   BMI 32.56 kg/m  General:   Alert,  pleasant and cooperative in NAD Head:  Normocephalic and atraumatic. Neck:  Supple; no masses or thyromegaly. Lungs:  Clear throughout to auscultation, normal respiratory effort.    Heart:  +S1, +S2, Regular rate and rhythm, No edema. Abdomen:  Soft, nontender and nondistended. Normal bowel sounds, without guarding, and without rebound.   Neurologic:  Alert and  oriented x4;  grossly normal neurologically.  Impression/Plan: Regina Patel is here for an colonoscopy to be performed for surveillance due to prior history of colon cancer  Risks, benefits, limitations, and alternatives regarding  colonoscopy have been reviewed with the patient.  Questions have been answered.  All parties agreeable.   Jonathon Bellows, MD  11/10/2018, 8:47 AM

## 2018-11-10 NOTE — Anesthesia Preprocedure Evaluation (Signed)
Anesthesia Evaluation  Patient identified by MRN, date of birth, ID band Patient awake    Reviewed: Allergy & Precautions, NPO status , Patient's Chart, lab work & pertinent test results  History of Anesthesia Complications Negative for: history of anesthetic complications  Airway Mallampati: II  TM Distance: >3 FB Neck ROM: Full    Dental no notable dental hx.    Pulmonary neg pulmonary ROS, neg sleep apnea, neg COPD,    breath sounds clear to auscultation- rhonchi (-) wheezing      Cardiovascular Exercise Tolerance: Good (-) hypertension(-) CAD, (-) Past MI, (-) Cardiac Stents and (-) CABG  Rhythm:Regular Rate:Normal - Systolic murmurs and - Diastolic murmurs    Neuro/Psych  Headaches, negative psych ROS   GI/Hepatic negative GI ROS, Neg liver ROS,   Endo/Other  neg diabetesHypothyroidism   Renal/GU negative Renal ROS     Musculoskeletal negative musculoskeletal ROS (+)   Abdominal (+) + obese,   Peds  Hematology  (+) anemia ,   Anesthesia Other Findings Past Medical History: No date: Anemia 11/11/2017: Cancer of sigmoid colon ([place])     Comment:  Partial sigmoid colon resection.  No date: Diverticulosis 12/14/2017: Genetic testing     Comment:  Common Cancers panel (47 genes) @ [facility] - No               pathogenic mutations detected No date: Headache No date: Heart murmur No date: Hyperlipidemia No date: Hypothyroidism No date: Thyroid disease   Reproductive/Obstetrics                             Anesthesia Physical Anesthesia Plan  ASA: II  Anesthesia Plan: General   Post-op Pain Management:    Induction: Intravenous  PONV Risk Score and Plan: 2 and Propofol infusion  Airway Management Planned: Natural Airway  Additional Equipment:   Intra-op Plan:   Post-operative Plan:   Informed Consent: I have reviewed the patients History and Physical, chart, labs and  discussed the procedure including the risks, benefits and alternatives for the proposed anesthesia with the patient or authorized representative who has indicated his/her understanding and acceptance.   Dental advisory given  Plan Discussed with: CRNA and Anesthesiologist  Anesthesia Plan Comments:         Anesthesia Quick Evaluation

## 2018-11-10 NOTE — Anesthesia Postprocedure Evaluation (Signed)
Anesthesia Post Note  Patient: Regina Patel  Procedure(s) Performed: COLONOSCOPY WITH PROPOFOL (N/A )  Patient location during evaluation: Endoscopy Anesthesia Type: General Level of consciousness: awake and alert and oriented Pain management: pain level controlled Vital Signs Assessment: post-procedure vital signs reviewed and stable Respiratory status: spontaneous breathing, nonlabored ventilation and respiratory function stable Cardiovascular status: blood pressure returned to baseline and stable Postop Assessment: no signs of nausea or vomiting Anesthetic complications: no     Last Vitals:  Vitals:   11/10/18 0935 11/10/18 0945  BP: 117/78 117/81  Pulse:    Resp:    Temp:    SpO2: 100% 100%    Last Pain:  Vitals:   11/10/18 0945  TempSrc:   PainSc: 0-No pain                 Rock Sobol

## 2018-11-13 ENCOUNTER — Encounter: Payer: Self-pay | Admitting: Gastroenterology

## 2018-11-13 LAB — SURGICAL PATHOLOGY

## 2018-11-20 ENCOUNTER — Ambulatory Visit (INDEPENDENT_AMBULATORY_CARE_PROVIDER_SITE_OTHER): Payer: Managed Care, Other (non HMO) | Admitting: Surgery

## 2018-11-20 ENCOUNTER — Other Ambulatory Visit: Payer: Self-pay

## 2018-11-20 ENCOUNTER — Encounter: Payer: Self-pay | Admitting: Surgery

## 2018-11-20 VITALS — BP 149/86 | HR 92 | Temp 97.9°F | Resp 18 | Ht 67.0 in | Wt 207.8 lb

## 2018-11-20 DIAGNOSIS — K6389 Other specified diseases of intestine: Secondary | ICD-10-CM | POA: Diagnosis not present

## 2018-11-20 NOTE — Progress Notes (Signed)
PROCEDURE Removal of port  Anesthesia: lidocaine 1% w epi. 10cc  ETI:[id number]  Complications: none  Informed consent was obtained patient was prepped and draped in the usual sterile fashion.  Lidocaine was infiltrated and using a 15 blade knife and incision was created.  Metzenbaum scissors were used to dissect through subcutaneous tissue and entered the port pocket.  The Prolene sutures were removed.  The port was delivered and removed in the standard fashion after asking the patient for a Valsalva maneuver.  We held pressure and there was no bleeding.  The wound was closed in a 2 layer fashion with 3-0 interrupted Vicryl and 4-0 Monocryl for the skin.  Dermabond was used.  No complications

## 2018-11-20 NOTE — Patient Instructions (Signed)
You may shower on Wednesday. Please do not submerge in water. If you notice any signs of infection please call our office. Fever, chills, bright redness around the incision or a pus drainage.  Call our office with any questions or concerns that you may have.     Implanted Port Removal Implanted port removal is a procedure to remove the port and catheter (port-a-cath) that is implanted under your skin. The port is a small disc under your skin that can be punctured with a needle. It is connected to a vein in your chest or neck by a small flexible tube (catheter). The port-a-cath is used for treatment through an IV tube and for taking blood samples. Your health care provider will remove the port-a-cath if:  You no longer need it for treatment.  It is not working properly.  The area around it gets infected.  Tell a health care provider about:  Any allergies you have.  All medicines you are taking, including vitamins, herbs, eye drops, creams, and over-the-counter medicines.  Any problems you or family members have had with anesthetic medicines.  Any blood disorders you have.  Any surgeries you have had.  Any medical conditions you have.  Whether you are pregnant or may be pregnant. What are the risks? Generally, this is a safe procedure. However, problems may occur, including:  Infection.  Bleeding.  Allergic reactions to anesthetic medicines.  Damage to nerves or blood vessels.  What happens before the procedure?  You will have: ? A physical exam. ? Blood tests. ? Imaging tests, including a chest X-ray.  Follow instructions from your health care provider about eating or drinking restrictions.  Ask your health care provider about: ? Changing or stopping your regular medicines. This is especially important if you are taking diabetes medicines or blood thinners. ? Taking medicines such as aspirin and ibuprofen. These medicines can thin your blood. Do not take these  medicines before your procedure if your surgeon instructs you not to.  Ask your health care provider how your surgical site will be marked or identified.  You may be given antibiotic medicine to help prevent infection.  Plan to have someone take you home after the procedure.  If you will be going home right after the procedure, plan to have someone stay with you for 24 hours. What happens during the procedure?  To reduce your risk of infection: ? Your health care team will wash or sanitize their hands. ? Your skin will be washed with soap.  You may be given one or more of the following: ? A medicine to help you relax (sedative). ? A medicine to numb the area (local anesthetic).  A small cut (incision) will be made at the site of your port-a-cath.  The port-a-cath and the catheter that has been inside your vein will gently be removed.  The incision will be closed with stitches (sutures), adhesive strips, or skin glue.  A bandage (dressing) will be placed over the incision. The procedure may vary among health care providers and hospitals. What happens after the procedure?  Your blood pressure, heart rate, breathing rate, and blood oxygen level will be monitored often until the medicines you were given have worn off.  Do not drive for 24 hours if you received a sedative. This information is not intended to replace advice given to you by your health care provider. Make sure you discuss any questions you have with your health care provider. Document Released: 11/17/2015 Document Revised: 05/13/2016  Document Reviewed: 09/10/2015 Elsevier Interactive Patient Education  [employer].

## 2018-11-21 ENCOUNTER — Encounter: Payer: Self-pay | Admitting: Gastroenterology

## 2018-11-22 ENCOUNTER — Ambulatory Visit
Admission: RE | Admit: 2018-11-22 | Discharge: 2018-11-22 | Disposition: A | Discharge status: Home / Self Care | Payer: Managed Care, Other (non HMO) | Source: Ambulatory Visit | Attending: Nurse Practitioner | Admitting: Nurse Practitioner

## 2018-11-22 DIAGNOSIS — Z1231 Encounter for screening mammogram for malignant neoplasm of breast: Secondary | ICD-10-CM | POA: Insufficient documentation

## 2018-11-24 ENCOUNTER — Encounter: Payer: Self-pay | Admitting: *Deleted

## 2018-11-24 DIAGNOSIS — C187 Malignant neoplasm of sigmoid colon: Secondary | ICD-10-CM

## 2018-11-24 NOTE — Progress Notes (Signed)
11/24/18: I attempted to contact Ms. Knowlton today concerning her 12 month visit/questionnaire for [id number].  Ms. Canal was originally to be seen in the clinic this week, but she had her port removed on 11/20/18 instead.  She has been rescheduled to see Dr. Janese Banks on 01/04/19.  I left Ms. Lehrmann a voice message to be looking for her 12 month questionnaire to come via email and to contact me.  If I do not hear from her today, I will attempt to contact her again next week, 11/27/18.  I will also plan to see Ms. Mathieson at her 01/04/19 visit. Raynelle Dick, RN BSN "11/24/2018 10:20 AM"

## 2018-11-29 ENCOUNTER — Ambulatory Visit
Admission: RE | Admit: 2018-11-29 | Discharge: 2018-11-29 | Disposition: A | Discharge status: Home / Self Care | Payer: Managed Care, Other (non HMO) | Source: Ambulatory Visit | Attending: Nurse Practitioner | Admitting: Nurse Practitioner

## 2018-11-29 ENCOUNTER — Encounter: Payer: Self-pay | Admitting: *Deleted

## 2018-11-29 ENCOUNTER — Other Ambulatory Visit: Payer: Managed Care, Other (non HMO)

## 2018-11-29 DIAGNOSIS — C187 Malignant neoplasm of sigmoid colon: Secondary | ICD-10-CM

## 2018-11-29 DIAGNOSIS — R928 Other abnormal and inconclusive findings on diagnostic imaging of breast: Secondary | ICD-10-CM | POA: Diagnosis present

## 2018-11-29 DIAGNOSIS — Z1231 Encounter for screening mammogram for malignant neoplasm of breast: Secondary | ICD-10-CM | POA: Diagnosis not present

## 2018-11-29 NOTE — Progress Notes (Signed)
11/29/18: I contacted Regina Patel today concerning her participation in [phone].  [name] will not be coming back for another clinic visit until 01/04/19, so I called to remind her that her 12 month questionnaire should be arriving in the near future.  She got her port out last week and stated she was doing well and currently had no problems or questions concerning the study.  I will plan to follow up with her at her next visit and complete her 12 month study data at that time. Raynelle Dick, RN BSN "11/29/2018 2:41 PM"

## 2018-12-22 ENCOUNTER — Encounter: Payer: Self-pay | Admitting: *Deleted

## 2018-12-22 DIAGNOSIS — C187 Malignant neoplasm of sigmoid colon: Secondary | ICD-10-CM

## 2018-12-22 NOTE — Research (Signed)
12/22/18: I received another notification on 12/18/18 about a delinquent questionnaire for Ms. Teodoro Kil for study [id number].  I attempted to contact Ms. Renne and left a message on her voice mail.  Will attempt to contact her in another week.  Will see her in the clinic in mid January. Raynelle Dick, RN BSN "12/22/2018 2:21 PM"

## 2018-12-25 ENCOUNTER — Other Ambulatory Visit: Payer: Managed Care, Other (non HMO)

## 2018-12-25 DIAGNOSIS — E039 Hypothyroidism, unspecified: Secondary | ICD-10-CM

## 2018-12-26 ENCOUNTER — Ambulatory Visit
Admission: RE | Admit: 2018-12-26 | Discharge: 2018-12-26 | Disposition: A | Discharge status: Home / Self Care | Payer: Managed Care, Other (non HMO) | Source: Ambulatory Visit | Attending: Oncology | Admitting: Oncology

## 2018-12-26 DIAGNOSIS — C187 Malignant neoplasm of sigmoid colon: Secondary | ICD-10-CM | POA: Insufficient documentation

## 2018-12-26 LAB — TSH+FREE T4: TSH W/REFLEX TO FT4: 1.98 mIU/L

## 2018-12-26 LAB — POCT I-STAT CREATININE: CREATININE: 0.7 mg/dL (ref 0.44–1.00)

## 2018-12-26 MED ORDER — IOPAMIDOL (ISOVUE-300) INJECTION 61%
100.0000 mL | Freq: Once | INTRAVENOUS | Status: AC | PRN
  Administered 2018-12-26: 100 mL via INTRAVENOUS

## 2019-01-04 ENCOUNTER — Inpatient Hospital Stay: Payer: Managed Care, Other (non HMO)

## 2019-01-04 ENCOUNTER — Encounter: Payer: Self-pay | Admitting: Oncology

## 2019-01-04 ENCOUNTER — Inpatient Hospital Stay: Payer: Managed Care, Other (non HMO) | Attending: Oncology | Admitting: Oncology

## 2019-01-04 ENCOUNTER — Encounter: Payer: Self-pay | Admitting: *Deleted

## 2019-01-04 VITALS — BP 138/85 | HR 91 | Temp 97.2°F | Resp 20 | Wt 207.5 lb

## 2019-01-04 DIAGNOSIS — Z9221 Personal history of antineoplastic chemotherapy: Secondary | ICD-10-CM | POA: Diagnosis not present

## 2019-01-04 DIAGNOSIS — G629 Polyneuropathy, unspecified: Secondary | ICD-10-CM

## 2019-01-04 DIAGNOSIS — E039 Hypothyroidism, unspecified: Secondary | ICD-10-CM

## 2019-01-04 DIAGNOSIS — Z923 Personal history of irradiation: Secondary | ICD-10-CM | POA: Diagnosis not present

## 2019-01-04 DIAGNOSIS — Z85038 Personal history of other malignant neoplasm of large intestine: Secondary | ICD-10-CM | POA: Diagnosis not present

## 2019-01-04 DIAGNOSIS — Z08 Encounter for follow-up examination after completed treatment for malignant neoplasm: Secondary | ICD-10-CM

## 2019-01-04 DIAGNOSIS — C187 Malignant neoplasm of sigmoid colon: Secondary | ICD-10-CM

## 2019-01-04 LAB — COMPREHENSIVE METABOLIC PANEL
ALBUMIN: 4.5 g/dL (ref 3.5–5.0)
ALT: 33 U/L (ref 0–44)
ANION GAP: 9 (ref 5–15)
AST: 24 U/L (ref 15–41)
Alkaline Phosphatase: 89 U/L (ref 38–126)
BUN: 13 mg/dL (ref 6–20)
CALCIUM: 9.7 mg/dL (ref 8.9–10.3)
CO2: 27 mmol/L (ref 22–32)
Chloride: 104 mmol/L (ref 98–111)
Creatinine, Ser: 0.7 mg/dL (ref 0.44–1.00)
GFR calc Af Amer: >60 mL/min (ref >60)
GFR calc non Af Amer: >60 mL/min (ref >60)
Glucose, Bld: 101 mg/dL — ABNORMAL HIGH (ref 70–99)
POTASSIUM: 4.1 mmol/L (ref 3.5–5.1)
SODIUM: 140 mmol/L (ref 135–145)
Total Bilirubin: 0.5 mg/dL (ref 0.3–1.2)
Total Protein: 7.7 g/dL (ref 6.5–8.1)

## 2019-01-04 LAB — CBC WITH DIFFERENTIAL/PLATELET
ABS IMMATURE GRANULOCYTES: 0 10*3/uL (ref 0.00–0.07)
BASOS PCT: 0 %
Basophils Absolute: 0 10*3/uL (ref 0.0–0.1)
EOS ABS: 0.1 10*3/uL (ref 0.0–0.5)
Eosinophils Relative: 2 %
HCT: 39.4 % (ref 36.0–46.0)
Hemoglobin: 13.1 g/dL (ref 12.0–15.0)
Immature Granulocytes: 0 %
Lymphocytes Relative: 25 %
Lymphs Abs: 1.2 10*3/uL (ref 0.7–4.0)
MCH: 30.2 pg (ref 26.0–34.0)
MCHC: 33.2 g/dL (ref 30.0–36.0)
MCV: 90.8 fL (ref 80.0–100.0)
MONO ABS: 0.4 10*3/uL (ref 0.1–1.0)
Monocytes Relative: 9 %
NEUTROS ABS: 3 10*3/uL (ref 1.7–7.7)
NEUTROS PCT: 64 %
PLATELETS: 195 10*3/uL (ref 150–400)
RBC: 4.34 MIL/uL (ref 3.87–5.11)
RDW: 12.6 % (ref 11.5–15.5)
WBC: 4.6 10*3/uL (ref 4.0–10.5)
nRBC: 0 % (ref 0.0–0.2)

## 2019-01-04 NOTE — Progress Notes (Signed)
Colon Ca surveillance. Appetite is 100%. Bowels with bouts of constipation which may be impacted by her thyroid. No pain. 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 Feels active and involved in life again.

## 2019-01-04 NOTE — Research (Signed)
01/04/2019: I met with Ms. Ritacco in the clinic today for her 12 month [id number] visit.  Ms. Filter stated that she finished her last RT the first of October, 2019.  She had her last CT of Abdomen/Pelvis on 12/26/2018 and it was normal with no signs of recurrence or metastasis.  She did admit that she had not completed her 12 month questionnaires and assured me she would try to complete them in the next 2 days. She was out of town and busy with family obligations over the holidays. She is doing well with no current complications from her surgery, chemotherapy or RT.  Her last colonoscopy showed one precancerous polyp which was removed and was benign.  I informed the patient that her next and final visit and questionnaire for the study would be on or around January of 2021.  I also informed Ms. Sturgell that I would not be with the [facility] after 02/09/2019 and that my replacement would be conducting her last visit.  I thanked her for her time and participation in the study and wished her well for the future. Raynelle Dick, RN BSN "01/04/2019 3:59 PM"

## 2019-01-04 NOTE — Progress Notes (Signed)
Survivorship Care Plan visit completed.  Treatment summary reviewed and given to patient.  ASCO answers booklet reviewed and given to patient.  CARE program and Cancer Transitions discussed with patient along with other resources cancer center offers to patients and caregivers.  Patient verbalized understanding.    

## 2019-01-05 LAB — CEA: CEA1: 0.7 ng/mL (ref 0.0–4.7)

## 2019-01-05 NOTE — Progress Notes (Signed)
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 Surveillance for colon cancer  Heme/Onc history: patient is a 48year-old female who was seen by Dr. Vicente Males from GI for recurrent episodes of diverticulitis sinceaugust2018 which was treated with antibiotics. However patient continued to have some abdominal cramping along with some bleeding in her stool and constipation alternating with diarrhea. Patient underwent flexible sigmoidoscopy on 11/07/2017. A circumferential infiltrative completely obstructing large mass was found in the rectosigmoid colon.Biopsy showed at least high-grade dysplasia. Patient was referred to Dr. Derinda Sis consideration of hemicolectomy.  Patient underwent laparoscopic low anterior resection along with small bowel resection and laparoscopic takedown of the splenic flexure on 11/11/2017. 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 Tumor size was 3 cm, moderately differentiated. All margins were negative for invasive carcinoma. Perineural invasion and lymphovascular invasion was present. 1 out of 17 lymph nodes was positive for malignancy. Pathologic stage was pT4b pN1a. MSI stable  Preoperative CEA is not available. CT abdomen on 10/26/2017  showed residual changes of diverticulitis although improved when compared to prior exam. No perforation or abscess is identified.  No family history of colon cancer. History of breast cancer in her mother in her 58s. Genetic testing was negative  Patient completed 10 cycles of FOLFOX chemotherapy in June 2019. oxaliplatin stopped after 9 cycles. She did not wish to complete 12 cycles due to neuropathy and fatigue. Patient also had RT given T4 disease  Interval history- she feels well. Neuropathy has almost resolved on cymbalta. Her appetite is good.  She denies any unintentional weight loss.  Denies any blood in her stool or urine.  Colonoscopy in November 2019 did not reveal any evidence of malignancy  ECOG PS- 0 Pain scale- 0   Review of systems- Review of Systems  Constitutional: Negative for chills, fever, malaise/fatigue and weight loss.  HENT: Negative for congestion, ear discharge and nosebleeds.   Eyes: Negative for blurred vision.  Respiratory: Negative for cough, hemoptysis, sputum production, shortness of breath and wheezing.   Cardiovascular: Negative for chest pain, palpitations, orthopnea and claudication.  Gastrointestinal: Negative for abdominal pain, blood in stool, constipation, diarrhea, heartburn, melena, nausea and vomiting.  Genitourinary: Negative for dysuria, flank pain, frequency, hematuria and urgency.  Musculoskeletal: Negative for back pain, joint pain and myalgias.  Skin: Negative for rash.  Neurological: Negative for dizziness, tingling, focal weakness, seizures, weakness and headaches.  Endo/Heme/Allergies: Does not bruise/bleed easily.  Psychiatric/Behavioral: Negative for depression and suicidal ideas. The patient does not have insomnia.       No Known Allergies   Past Medical History:  Diagnosis Date  . Anemia   . Cancer of sigmoid colon ([place]) 11/11/2017   Partial sigmoid colon resection, chemo + rad tx's.   . Diverticulosis   . Genetic  testing 12/14/2017   Common Cancers panel (47 genes) @ [facility] - No pathogenic mutations detected  .  Headache   . Heart murmur   . Hyperlipidemia   . Hypothyroidism   . Thyroid disease      Past Surgical History:  Procedure Laterality Date  . APPENDECTOMY     possible during hysterectomy  . BOWEL RESECTION N/A 11/11/2017   Procedure: SMALL BOWEL RESECTION;  Surgeon: Jules Husbands, MD;  Location: [facility] ORS;  Service: General;  Laterality: N/A;  . COLONOSCOPY WITH PROPOFOL N/A 11/10/2018   Procedure: COLONOSCOPY WITH PROPOFOL;  Surgeon: Jonathon Bellows, MD;  Location: [facility] ENDOSCOPY;  Service: Gastroenterology;  Laterality: N/A;  . FLEXIBLE SIGMOIDOSCOPY N/A 11/07/2017   Procedure: FLEXIBLE SIGMOIDOSCOPY;  Surgeon: Jonathon Bellows, MD;  Location: [facility] ENDOSCOPY;  Service: Gastroenterology;  Laterality: N/A;  . LAPAROSCOPIC SIGMOID COLECTOMY N/A 11/11/2017   Procedure: LAPAROSCOPIC SIGMOID COLECTOMY;  Surgeon: Jules Husbands, MD;  Location: [facility] ORS;  Service: General;  Laterality: N/A;  . MOUTH SURGERY    . PORTACATH PLACEMENT Right 11/29/2017   Procedure: INSERTION PORT-A-CATH;  Surgeon: Jules Husbands, MD;  Location: [facility];  Service: General;  Laterality: Right;  May do local and IV sedation. Please have U/S  . TOTAL ABDOMINAL HYSTERECTOMY  05/20/2014   ooperectomy unilateral    Social History   Socioeconomic History  . Marital status: Married    Spouse name: Not on file  . Number of children: Not on file  . Years of education: Not on file  . Highest education level: Not on file  Occupational History  . Not on file  Social Needs  . Financial resource strain: Not on file  . Food insecurity:    Worry: Not on file    Inability: Not on file  . Transportation needs:    Medical: Not on file    Non-medical: Not on file  Tobacco Use  . Smoking status: Never Smoker  . Smokeless tobacco: Never Used  Substance and Sexual Activity  . Alcohol use: No    Alcohol/week: 0.0  standard drinks  . Drug use: No  . Sexual activity: Yes  Lifestyle  . Physical activity:    Days per week: Not on file    Minutes per session: Not on file  . Stress: Not on file  Relationships  . Social connections:    Talks on phone: Not on file    Gets together: Not on file    Attends religious service: Not on file    Active member of club or organization: Not on file    Attends meetings of clubs or organizations: Not on file    Relationship status: Not on file  . Intimate partner violence:    Fear of current or ex partner: Not on file    Emotionally abused: Not on file    Physically abused: Not on file    Forced sexual activity: Not on file  Other Topics Concern  . Not on file  Social History Narrative  . Not on file    Family History  Problem Relation Age of Onset  . Breast cancer Mother 41       currently 11  . Hypertension Mother   . Thyroid disease Brother   . Hernia Brother   . Thyroid cancer Maternal Grandmother 68       deceased 71  . Stomach cancer Paternal Grandfather        unconfirmed; deceased 63  . Stomach cancer Paternal Aunt 35       deceased 76  . Colon cancer Maternal Aunt 68  also lung cancer  . Stomach cancer Maternal Uncle 46     Current Outpatient Medications:  .  APPLE CIDER VINEGAR PO, Take 1 tablet by mouth daily., Disp: , Rfl:  .  Biotin 1000 MCG tablet, Take 1,000 mcg by mouth daily., Disp: , Rfl:  .  Cyanocobalamin (VITAMIN B 12 PO), Take by mouth., Disp: , Rfl:  .  levothyroxine (SYNTHROID, LEVOTHROID) 175 MCG tablet, Take 1 tablet (175 mcg total) by mouth daily before breakfast. (Patient taking differently: Take 200 mcg by mouth daily before breakfast. ), Disp: 30 tablet, Rfl: 5 .  Multiple Vitamin tablet, Take 2 tablets by mouth daily. , Disp: , Rfl:  .  OMEGA-3 FATTY ACIDS PO, Take 2 capsules by mouth daily. , Disp: , Rfl:  .  VITAMIN D, CHOLECALCIFEROL, PO, Take 1 tablet by mouth daily., Disp: , Rfl:  No current  facility-administered medications for this visit.   Facility-Administered Medications Ordered in Other Visits:  .  heparin lock flush 100 unit/mL, 500 Units, Intracatheter, PRN, Sindy Guadeloupe, MD .  sodium chloride flush (NS) 0.9 % injection 10 mL, 10 mL, Intravenous, PRN, Sindy Guadeloupe, MD, 10 mL at 06/13/18 0844  Physical exam:  Vitals:   01/04/19 1144  BP: 138/85  Pulse: 91  Resp: 20  Temp: (!) 97.2 F (36.2 C)  TempSrc: Tympanic  Weight: 207 lb 8 oz (94.1 kg)   Physical Exam Constitutional:      General: She is not in acute distress. HENT:     Head: Normocephalic and atraumatic.  Eyes:     Pupils: Pupils are equal, round, and reactive to light.  Neck:     Musculoskeletal: Normal range of motion.  Cardiovascular:     Rate and Rhythm: Normal rate and regular rhythm.     Heart sounds: Normal heart sounds.  Pulmonary:     Effort: Pulmonary effort is normal.     Breath sounds: Normal breath sounds.  Abdominal:     General: Bowel sounds are normal. There is no distension.     Palpations: Abdomen is soft.     Tenderness: There is no abdominal tenderness.  Skin:    General: Skin is warm and dry.  Neurological:     Mental Status: She is alert and oriented to person, place, and time.      CMP Latest Ref Rng & Units 01/04/2019  Glucose 70 - 99 mg/dL 101(H)  BUN 6 - 20 mg/dL 13  Creatinine 0.44 - 1.00 mg/dL 0.70  Sodium 135 - 145 mmol/L 140  Potassium 3.5 - 5.1 mmol/L 4.1  Chloride 98 - 111 mmol/L 104  CO2 22 - 32 mmol/L 27  Calcium 8.9 - 10.3 mg/dL 9.7  Total Protein 6.5 - 8.1 g/dL 7.7  Total Bilirubin 0.3 - 1.2 mg/dL 0.5  Alkaline Phos 38 - 126 U/L 89  AST 15 - 41 U/L 24  ALT 0 - 44 U/L 33   CBC Latest Ref Rng & Units 01/04/2019  WBC 4.0 - 10.5 K/uL 4.6  Hemoglobin 12.0 - 15.0 g/dL 13.1  Hematocrit 36.0 - 46.0 % 39.4  Platelets 150 - 400 K/uL 195    No images are attached to the encounter.  Ct Chest W Contrast  Result Date: 12/26/2018 CLINICAL DATA:   Colorectal carcinoma the sigmoid colon. Partial resection. Chemotherapy and radiation complete. Initial staging T4 N1 M0. EXAM: CT CHEST, ABDOMEN, AND PELVIS WITH CONTRAST TECHNIQUE: Multidetector CT imaging of the chest, abdomen and pelvis was performed  following the standard protocol during bolus administration of intravenous contrast. CONTRAST:  142m ISOVUE-300 IOPAMIDOL (ISOVUE-300) INJECTION 61% COMPARISON:  06/29/2018 FINDINGS: CT CHEST FINDINGS Cardiovascular: No significant vascular findings. Normal heart size. No pericardial effusion. Mediastinum/Nodes: No axillary supraclavicular adenopathy. No mediastinal hilar adenopathy. No pericardial effusion. Esophagus normal. Lungs/Pleura: No suspicious pulmonary nodules. Musculoskeletal: No aggressive osseous lesion. CT ABDOMEN AND PELVIS FINDINGS Hepatobiliary: No focal hepatic lesion. No biliary ductal dilatation. Gallbladder is normal. Common bile duct is normal. Pancreas: Pancreas is normal. No ductal dilatation. No pancreatic inflammation. Spleen: Normal spleen Adrenals/urinary tract: Adrenal glands and kidneys are normal. The ureters and bladder normal. Stomach/Bowel: Stomach, small bowel, appendix, and cecum are normal. Sigmoid colon anastomosis noted. No nodularity obstruction. No adenopathy in the mesocolon. Vascular/Lymphatic: Abdominal aorta is normal caliber. There is no retroperitoneal or periportal lymphadenopathy. No pelvic lymphadenopathy. Reproductive: Post hysterectomy.  No adnexal abnormality. Other: No peritoneal metastasis or or omental disease Musculoskeletal: No aggressive osseous lesion. IMPRESSION: Chest Impression: 1. No evidence of thoracic metastasis. Abdomen / Pelvis Impression: 1. No evidence of local recurrence of colorectal carcinoma of the sigmoid colon. 2. No evidence of metastatic adenopathy. No solid organ metastasis. No skeletal metastasis Electronically Signed   By: SSuzy BouchardM.D.   On: 12/26/2018 12:40   Ct Abdomen  Pelvis W Contrast  Result Date: 12/26/2018 CLINICAL DATA:  Colorectal carcinoma the sigmoid colon. Partial resection. Chemotherapy and radiation complete. Initial staging T4 N1 M0. EXAM: CT CHEST, ABDOMEN, AND PELVIS WITH CONTRAST TECHNIQUE: Multidetector CT imaging of the chest, abdomen and pelvis was performed following the standard protocol during bolus administration of intravenous contrast. CONTRAST:  1043mISOVUE-300 IOPAMIDOL (ISOVUE-300) INJECTION 61% COMPARISON:  06/29/2018 FINDINGS: CT CHEST FINDINGS Cardiovascular: No significant vascular findings. Normal heart size. No pericardial effusion. Mediastinum/Nodes: No axillary supraclavicular adenopathy. No mediastinal hilar adenopathy. No pericardial effusion. Esophagus normal. Lungs/Pleura: No suspicious pulmonary nodules. Musculoskeletal: No aggressive osseous lesion. CT ABDOMEN AND PELVIS FINDINGS Hepatobiliary: No focal hepatic lesion. No biliary ductal dilatation. Gallbladder is normal. Common bile duct is normal. Pancreas: Pancreas is normal. No ductal dilatation. No pancreatic inflammation. Spleen: Normal spleen Adrenals/urinary tract: Adrenal glands and kidneys are normal. The ureters and bladder normal. Stomach/Bowel: Stomach, small bowel, appendix, and cecum are normal. Sigmoid colon anastomosis noted. No nodularity obstruction. No adenopathy in the mesocolon. Vascular/Lymphatic: Abdominal aorta is normal caliber. There is no retroperitoneal or periportal lymphadenopathy. No pelvic lymphadenopathy. Reproductive: Post hysterectomy.  No adnexal abnormality. Other: No peritoneal metastasis or or omental disease Musculoskeletal: No aggressive osseous lesion. IMPRESSION: Chest Impression: 1. No evidence of thoracic metastasis. Abdomen / Pelvis Impression: 1. No evidence of local recurrence of colorectal carcinoma of the sigmoid colon. 2. No evidence of metastatic adenopathy. No solid organ metastasis. No skeletal metastasis Electronically Signed   By:  StSuzy Bouchard.D.   On: 12/26/2018 12:40     Assessment and plan- Patient is a [age].o. 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 She is here for routine surveillance of colon cancer  I have reviewed CT chest abdomen and pelvis images independently and discussed findings with the patient.  There is currently no evidence of recurrence or metastatic disease.  Clinically she is doing well.  She will follow up with GI for her surveillance colonoscopy.  Her port has also been removed.  I will see her back in 3 months time with CBC CMP and CEA  Visit Diagnosis 1. Encounter for follow-up surveillance of colon cancer      Dr. Randa Evens, MD, MPH [facility] [phone] 01/05/2019 1:00 PM

## 2019-04-05 ENCOUNTER — Ambulatory Visit: Payer: Managed Care, Other (non HMO) | Admitting: Oncology

## 2019-04-05 ENCOUNTER — Other Ambulatory Visit: Payer: Managed Care, Other (non HMO)

## 2019-04-08 IMAGING — MR MR ABDOMEN WO/W CM
7 of 17 series · 18 of 48 positions shown · IV contrast (eovist)
Comparison: None.

CLINICAL DATA: Elevated liver function tests. Colon carcinoma.
Undergoing chemotherapy.

EXAM:
MRI ABDOMEN WITHOUT AND WITH CONTRAST
TECHNIQUE: Multiplanar multisequence MR imaging of the abdomen was performed
both before and after the administration of intravenous contrast.
CONTRAST:  9mL EOVIST GADOXETATE DISODIUM 0.25 MOL/L IV SOLN

[Series 3: T2 · coronal · 8.0mm · 1.56mm/px · 1 of 21 slices shown (1 of 2)]
[im 1/21]
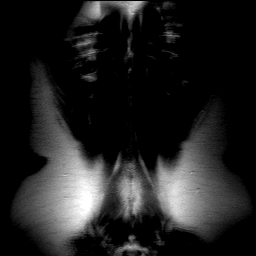

[Series 4: axial in-out of · axial · 7.0mm · 0.74mm/px · z∈[-39,+198]mm · 2 of 54 slices shown]
[im 1/54]
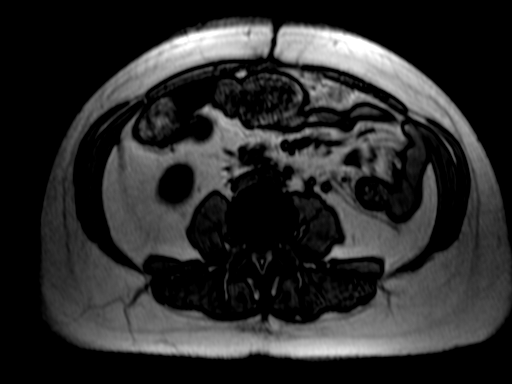
[im 54/54]
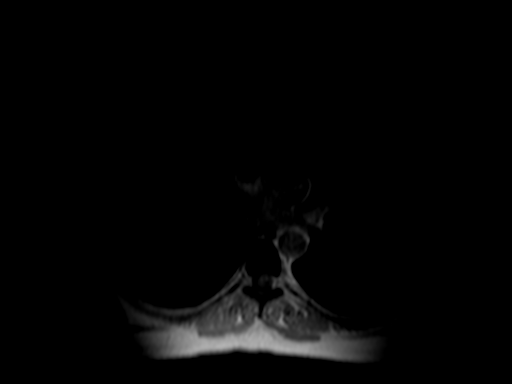

[Series 13: DWI · axial · 6.0mm · 2.97mm/px · z∈[-31,+250]mm · 6 of 117 slices shown]
[im 1/117]
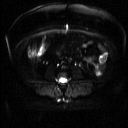
[im 24/117]
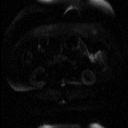
[im 47/117]
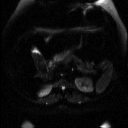
[im 70/117]
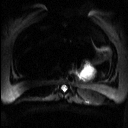
[im 93/117]
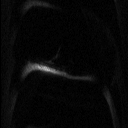
[im 117/117]
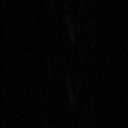

[Series 14: axial dwi_adc · axial · 6.0mm · 2.97mm/px · z∈[-31,+228]mm · 2 of 33 slices shown]
[im 1/33]
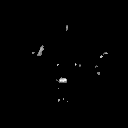
[im 33/33]
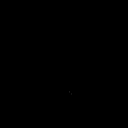

[Series 15: T2 fat-sat · axial · 7.0mm · 0.74mm/px · z∈[-71,+202]mm · 2 of 31 slices shown]
[im 1/31]
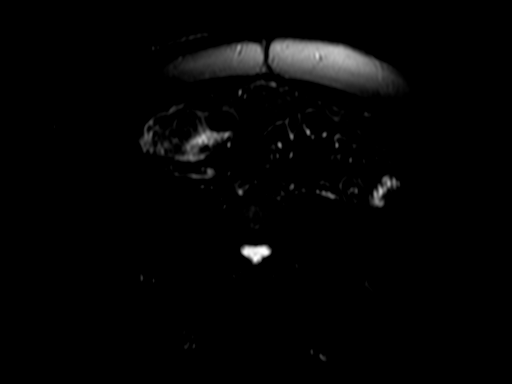
[im 31/31]
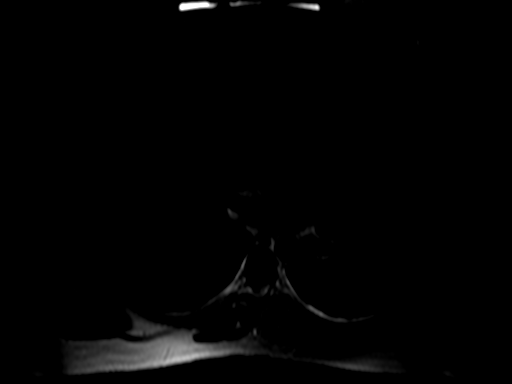

[Series 16: T2 · axial · 7.0mm · 1.48mm/px · z∈[-71,+202]mm · 2 of 31 slices shown (2 of 2)]
[im 1/31]
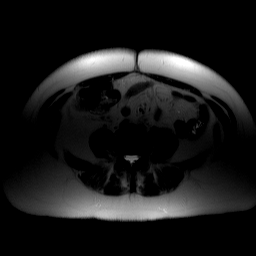
[im 31/31]
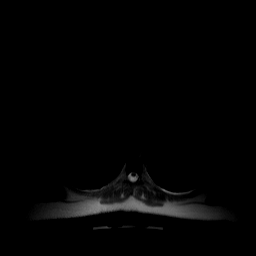

[Series 17: axial true fisp · axial · 4.0mm · 0.74mm/px · z∈[-63,+193]mm · 3 of 65 slices shown]
[im 1/65]
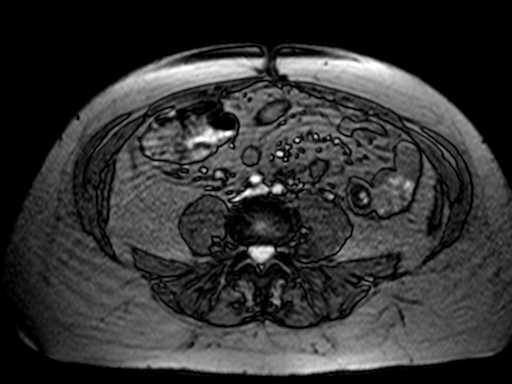
[im 33/65]
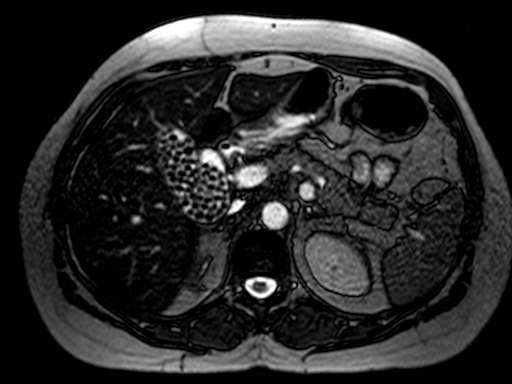
[im 65/65]
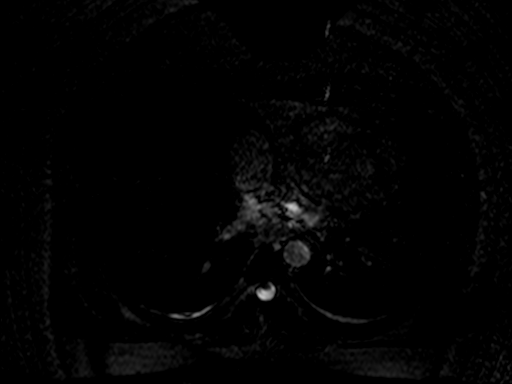

[18 of 48 positions shown; findings below may reference images not displayed]

FINDINGS: Lower chest: No acute findings.

Hepatobiliary: No hepatic masses identified. The liver shows diffuse
T2 hypointense signal, and T1 hypointense signal on inphase gradient
echo images, consistent with iron deposition. Sparing of the spleen,
pancreas, and bone marrow noted, raising suspicion for primary
hemochromatosis. No gross morphologic changes of cirrhosis
identified.

Numerous small gallstones are seen, however there is no evidence of
cholecystitis or biliary ductal dilatation.

Pancreas: Normal signal intensity. No mass or inflammatory changes.

Spleen: Within normal limits in size and appearance. Normal signal
intensity.

Adrenals/Urinary Tract: No masses identified. No evidence of
hydronephrosis.

Stomach/Bowel: Visualized abdominal bowel unremarkable.

Vascular/Lymphatic: No pathologically enlarged lymph nodes
identified. No abdominal aortic aneurysm.

Other:  None.

Musculoskeletal:  No suspicious bone lesions identified.
IMPRESSION: No evidence of hepatic or other abdominal metastatic disease.

Cholelithiasis. No radiographic evidence of cholecystitis or biliary
dilatation.

Diffuse hepatic iron deposition, suspicious for primary
hemochromatosis.

## 2019-05-22 ENCOUNTER — Inpatient Hospital Stay: Payer: Managed Care, Other (non HMO) | Attending: Oncology

## 2019-05-22 ENCOUNTER — Other Ambulatory Visit: Payer: Self-pay

## 2019-05-22 ENCOUNTER — Telehealth: Payer: Self-pay | Admitting: Oncology

## 2019-05-22 DIAGNOSIS — Z803 Family history of malignant neoplasm of breast: Secondary | ICD-10-CM | POA: Diagnosis not present

## 2019-05-22 DIAGNOSIS — K5900 Constipation, unspecified: Secondary | ICD-10-CM | POA: Diagnosis not present

## 2019-05-22 DIAGNOSIS — Z8249 Family history of ischemic heart disease and other diseases of the circulatory system: Secondary | ICD-10-CM | POA: Insufficient documentation

## 2019-05-22 DIAGNOSIS — R740 Nonspecific elevation of levels of transaminase and lactic acid dehydrogenase [LDH]: Secondary | ICD-10-CM | POA: Diagnosis not present

## 2019-05-22 DIAGNOSIS — Z8349 Family history of other endocrine, nutritional and metabolic diseases: Secondary | ICD-10-CM | POA: Diagnosis not present

## 2019-05-22 DIAGNOSIS — G629 Polyneuropathy, unspecified: Secondary | ICD-10-CM | POA: Diagnosis not present

## 2019-05-22 DIAGNOSIS — Z808 Family history of malignant neoplasm of other organs or systems: Secondary | ICD-10-CM | POA: Diagnosis not present

## 2019-05-22 DIAGNOSIS — E039 Hypothyroidism, unspecified: Secondary | ICD-10-CM | POA: Insufficient documentation

## 2019-05-22 DIAGNOSIS — R5383 Other fatigue: Secondary | ICD-10-CM | POA: Insufficient documentation

## 2019-05-22 DIAGNOSIS — Z9221 Personal history of antineoplastic chemotherapy: Secondary | ICD-10-CM | POA: Diagnosis not present

## 2019-05-22 DIAGNOSIS — R109 Unspecified abdominal pain: Secondary | ICD-10-CM | POA: Insufficient documentation

## 2019-05-22 DIAGNOSIS — Z9049 Acquired absence of other specified parts of digestive tract: Secondary | ICD-10-CM | POA: Diagnosis not present

## 2019-05-22 DIAGNOSIS — Z79899 Other long term (current) drug therapy: Secondary | ICD-10-CM | POA: Insufficient documentation

## 2019-05-22 DIAGNOSIS — R197 Diarrhea, unspecified: Secondary | ICD-10-CM | POA: Insufficient documentation

## 2019-05-22 DIAGNOSIS — C187 Malignant neoplasm of sigmoid colon: Secondary | ICD-10-CM | POA: Diagnosis not present

## 2019-05-22 DIAGNOSIS — Z800 Family history of malignant neoplasm of digestive organs: Secondary | ICD-10-CM | POA: Insufficient documentation

## 2019-05-22 DIAGNOSIS — Z08 Encounter for follow-up examination after completed treatment for malignant neoplasm: Secondary | ICD-10-CM

## 2019-05-22 LAB — CBC WITH DIFFERENTIAL/PLATELET
Abs Immature Granulocytes: 0.01 10*3/uL (ref 0.00–0.07)
Basophils Absolute: 0 10*3/uL (ref 0.0–0.1)
Basophils Relative: 0 %
Eosinophils Absolute: 0.1 10*3/uL (ref 0.0–0.5)
Eosinophils Relative: 2 %
HCT: 38.5 % (ref 36.0–46.0)
Hemoglobin: 12.8 g/dL (ref 12.0–15.0)
Immature Granulocytes: 0 %
Lymphocytes Relative: 31 %
Lymphs Abs: 1.2 10*3/uL (ref 0.7–4.0)
MCH: 30 pg (ref 26.0–34.0)
MCHC: 33.2 g/dL (ref 30.0–36.0)
MCV: 90.2 fL (ref 80.0–100.0)
Monocytes Absolute: 0.3 10*3/uL (ref 0.1–1.0)
Monocytes Relative: 8 %
Neutro Abs: 2.3 10*3/uL (ref 1.7–7.7)
Neutrophils Relative %: 59 %
Platelets: 185 10*3/uL (ref 150–400)
RBC: 4.27 MIL/uL (ref 3.87–5.11)
RDW: 14.1 % (ref 11.5–15.5)
WBC: 3.9 10*3/uL — ABNORMAL LOW (ref 4.0–10.5)
nRBC: 0 % (ref 0.0–0.2)

## 2019-05-22 LAB — COMPREHENSIVE METABOLIC PANEL
ALT: 59 U/L — ABNORMAL HIGH (ref 0–44)
AST: 28 U/L (ref 15–41)
Albumin: 4.1 g/dL (ref 3.5–5.0)
Alkaline Phosphatase: 89 U/L (ref 38–126)
Anion gap: 9 (ref 5–15)
BUN: 11 mg/dL (ref 6–20)
CO2: 25 mmol/L (ref 22–32)
Calcium: 9.3 mg/dL (ref 8.9–10.3)
Chloride: 105 mmol/L (ref 98–111)
Creatinine, Ser: 0.68 mg/dL (ref 0.44–1.00)
GFR calc Af Amer: >60 mL/min (ref >60)
GFR calc non Af Amer: >60 mL/min (ref >60)
Glucose, Bld: 99 mg/dL (ref 70–99)
Potassium: 3.8 mmol/L (ref 3.5–5.1)
Sodium: 139 mmol/L (ref 135–145)
Total Bilirubin: 0.6 mg/dL (ref 0.3–1.2)
Total Protein: 7.4 g/dL (ref 6.5–8.1)

## 2019-05-23 ENCOUNTER — Encounter: Payer: Self-pay | Admitting: Oncology

## 2019-05-23 ENCOUNTER — Inpatient Hospital Stay (HOSPITAL_BASED_OUTPATIENT_CLINIC_OR_DEPARTMENT_OTHER): Payer: Managed Care, Other (non HMO) | Admitting: Oncology

## 2019-05-23 ENCOUNTER — Other Ambulatory Visit: Payer: Self-pay

## 2019-05-23 DIAGNOSIS — C187 Malignant neoplasm of sigmoid colon: Secondary | ICD-10-CM | POA: Diagnosis not present

## 2019-05-23 DIAGNOSIS — Z08 Encounter for follow-up examination after completed treatment for malignant neoplasm: Secondary | ICD-10-CM

## 2019-05-23 DIAGNOSIS — Z85038 Personal history of other malignant neoplasm of large intestine: Secondary | ICD-10-CM

## 2019-05-23 LAB — CEA: CEA: 0.7 ng/mL (ref 0.0–4.7)

## 2019-05-23 NOTE — Progress Notes (Signed)
Still have a little bit numbness in toes on both feet, eating and drinking good. Bowels are good.

## 2019-05-24 ENCOUNTER — Encounter: Payer: Self-pay | Admitting: Oncology

## 2019-05-24 NOTE — Progress Notes (Signed)
I connected with Regina Patel on 05/24/19 at 10:00 AM EDT by video enabled telemedicine visit and verified that I am speaking with the correct person using two identifiers.   I discussed the limitations, risks, security and privacy concerns of performing an evaluation and management service by telemedicine and the availability of in-person appointments. I also discussed with the patient that there may be a patient responsible charge related to this service. The patient expressed understanding and agreed to proceed.  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 Vicente Males from GI for recurrent episodes of diverticulitis sinceaugust2018 which was treated with antibiotics. However patient continued to have some abdominal cramping along with some bleeding in her stool and constipation alternating with diarrhea. Patient underwent flexible sigmoidoscopy on 11/07/2017. A circumferential infiltrative completely obstructing large mass was found in the rectosigmoid colon.Biopsy showed at least high-grade dysplasia. Patient was referred to Dr. Derinda Sis consideration of hemicolectomy.  Patient underwent laparoscopic low anterior resection along with small bowel resection and laparoscopic takedown of the splenic flexure on 11/11/2017. 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 Tumor size was 3 cm, moderately differentiated. All margins were negative for invasive carcinoma. Perineural invasion and lymphovascular invasion was present. 1 out of 17 lymph nodes was positive for malignancy.  Pathologic stage was pT4b pN1a. MSI stable  Preoperative CEA is not available. CT abdomen on 10/26/2017 showed residual changes of diverticulitis although improved when compared to prior exam. No perforation or abscess is identified.  No family history of colon cancer. History of breast cancer in her mother in her 59s. Genetic testing was negative  Patient completed 10 cycles of FOLFOX chemotherapy in June 2019. oxaliplatin stopped after 9 cycles. She did not wish to complete 12 cycles due to neuropathy and fatigue. Patient also had RT given T4 disease   Interval history: She feels well overall.  Her appetite is good and she denies any unintentional weight loss.  Denies any abdominal pain or bloating.  Denies any blood in her stool or urine.  She still has mild tingling numbness in her extremities which is overall stable and does not affect her quality of life significantly.   Review of Systems  Constitutional: Negative for chills, fever, malaise/fatigue and weight loss.  HENT: Negative for congestion, ear discharge and nosebleeds.   Eyes: Negative for blurred vision.  Respiratory: Negative for cough, hemoptysis, sputum production, shortness of breath and wheezing.   Cardiovascular: Negative for chest pain, palpitations, orthopnea and claudication.  Gastrointestinal: Negative for abdominal pain, blood in stool, constipation, diarrhea, heartburn, melena, nausea and vomiting.  Genitourinary: Negative for dysuria, flank pain, frequency, hematuria and urgency.  Musculoskeletal: Negative for back pain, joint pain and myalgias.  Skin: Negative for rash.  Neurological: Positive for sensory change (Peripheral neuropathy). Negative for dizziness, tingling, focal weakness, seizures, weakness and headaches.  Endo/Heme/Allergies: Does not bruise/bleed easily.  Psychiatric/Behavioral: Negative for depression and suicidal ideas. The patient does not have insomnia.     No Known Allergies  Past  Medical History:  Diagnosis Date  . Anemia   . Cancer of sigmoid colon ([place]) 11/11/2017   Partial sigmoid colon resection, chemo +  rad tx's.   . Diverticulosis   . Genetic testing 12/14/2017   Common Cancers panel (47 genes) @ [facility] - No pathogenic mutations detected  . Headache   . Heart murmur   . Hyperlipidemia   . Hypothyroidism   . Thyroid disease     Past Surgical History:  Procedure Laterality Date  . APPENDECTOMY     possible during hysterectomy  . BOWEL RESECTION N/A 11/11/2017   Procedure: SMALL BOWEL RESECTION;  Surgeon: Jules Husbands, MD;  Location: [facility] ORS;  Service: General;  Laterality: N/A;  . COLONOSCOPY WITH PROPOFOL N/A 11/10/2018   Procedure: COLONOSCOPY WITH PROPOFOL;  Surgeon: Jonathon Bellows, MD;  Location: [facility] ENDOSCOPY;  Service: Gastroenterology;  Laterality: N/A;  . FLEXIBLE SIGMOIDOSCOPY N/A 11/07/2017   Procedure: FLEXIBLE SIGMOIDOSCOPY;  Surgeon: Jonathon Bellows, MD;  Location: [facility] ENDOSCOPY;  Service: Gastroenterology;  Laterality: N/A;  . LAPAROSCOPIC SIGMOID COLECTOMY N/A 11/11/2017   Procedure: LAPAROSCOPIC SIGMOID COLECTOMY;  Surgeon: Jules Husbands, MD;  Location: [facility] ORS;  Service: General;  Laterality: N/A;  . MOUTH SURGERY    . PORTACATH PLACEMENT Right 11/29/2017   Procedure: INSERTION PORT-A-CATH;  Surgeon: Jules Husbands, MD;  Location: [place];  Service: General;  Laterality: Right;  May do local and IV sedation. Please have U/S  . TOTAL ABDOMINAL HYSTERECTOMY  05/20/2014   ooperectomy unilateral    Social History   Socioeconomic History  . Marital status: Married    Spouse name: Not on file  . Number of children: Not on file  . Years of education: Not on file  . Highest education level: Not on file  Occupational History  . Not on file  Social Needs  . Financial resource strain: Not on file  . Food insecurity:    Worry: Not on file    Inability: Not on file  . Transportation needs:    Medical: Not on file     Non-medical: Not on file  Tobacco Use  . Smoking status: Never Smoker  . Smokeless tobacco: Never Used  Substance and Sexual Activity  . Alcohol use: No    Alcohol/week: 0.0 standard drinks  . Drug use: No  . Sexual activity: Yes  Lifestyle  . Physical activity:    Days per week: Not on file    Minutes per session: Not on file  . Stress: Not on file  Relationships  . Social connections:    Talks on phone: Not on file    Gets together: Not on file    Attends religious service: Not on file    Active member of club or organization: Not on file    Attends meetings of clubs or organizations: Not on file    Relationship status: Not on file  . Intimate partner violence:    Fear of current or ex partner: Not on file    Emotionally abused: Not on file    Physically abused: Not on file    Forced sexual activity: Not on file  Other Topics Concern  . Not on file  Social History Narrative  . Not on file    Family History  Problem Relation Age of Onset  . Breast cancer Mother 48       currently 93  . Hypertension Mother   . Thyroid disease Brother   . Hernia Brother   . Thyroid cancer Maternal Grandmother 51       deceased 61  . Stomach cancer Paternal Grandfather  unconfirmed; deceased 54  . Stomach cancer Paternal Aunt 40       deceased 8  . Colon cancer Maternal Aunt 68       also lung cancer  . Stomach cancer Maternal Uncle 45     Current Outpatient Medications:  .  APPLE CIDER VINEGAR PO, Take 1 tablet by mouth daily., Disp: , Rfl:  .  Biotin 1000 MCG tablet, Take 1,000 mcg by mouth daily., Disp: , Rfl:  .  Cyanocobalamin (VITAMIN B 12 PO), Take 2 Doses by mouth daily. , Disp: , Rfl:  .  levothyroxine (SYNTHROID, LEVOTHROID) 175 MCG tablet, Take 1 tablet (175 mcg total) by mouth daily before breakfast. (Patient taking differently: Take 200 mcg by mouth daily before breakfast. ), Disp: 30 tablet, Rfl: 5 .  Multiple Vitamin tablet, Take 2 tablets by mouth daily. ,  Disp: , Rfl:  .  OMEGA-3 FATTY ACIDS PO, Take 2 capsules by mouth daily. , Disp: , Rfl:  .  VITAMIN D, CHOLECALCIFEROL, PO, Take 1 tablet by mouth daily., Disp: , Rfl:  No current facility-administered medications for this visit.   Facility-Administered Medications Ordered in Other Visits:  .  heparin lock flush 100 unit/mL, 500 Units, Intracatheter, PRN, Sindy Guadeloupe, MD .  sodium chloride flush (NS) 0.9 % injection 10 mL, 10 mL, Intravenous, PRN, Sindy Guadeloupe, MD, 10 mL at 06/13/18 0844   CMP Latest Ref Rng & Units 05/22/2019  Glucose 70 - 99 mg/dL 99  BUN 6 - 20 mg/dL 11  Creatinine 0.44 - 1.00 mg/dL 0.68  Sodium 135 - 145 mmol/L 139  Potassium 3.5 - 5.1 mmol/L 3.8  Chloride 98 - 111 mmol/L 105  CO2 22 - 32 mmol/L 25  Calcium 8.9 - 10.3 mg/dL 9.3  Total Protein 6.5 - 8.1 g/dL 7.4  Total Bilirubin 0.3 - 1.2 mg/dL 0.6  Alkaline Phos 38 - 126 U/L 89  AST 15 - 41 U/L 28  ALT 0 - 44 U/L 59(H)   CBC Latest Ref Rng & Units 05/22/2019  WBC 4.0 - 10.5 K/uL 3.9(L)  Hemoglobin 12.0 - 15.0 g/dL 12.8  Hematocrit 36.0 - 46.0 % 38.5  Platelets 150 - 400 K/uL 185     Observation/objective: Appears in no acute distress of a video visit today.  Breathing is nonlabored  Assessment and plan:Patient is a 48 y.o. 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 this is a surveillance visit for colon cancer   Clinically patient is doing well and there are no symptoms concerning for recurrence.  CBC CMP and lipids she is due for a repeat CT chest abdomen and pelvis with contrast next month which I will order.Patient noted to have a mildly elevated ALT of 59 which we will monitor at the next visit.  Follow-up instructions: Return to clinic in 1 month's time for office visit.  Check CMP on that day.  CT chest abdomen and pelvis with contrast prior  I discussed the assessment and  treatment plan with the patient. The patient was provided an opportunity to ask questions and all were answered. The patient agreed with the plan and demonstrated an understanding of the instructions.   The patient was advised to call back or seek an in-person evaluation if the symptoms worsen or if the condition fails to improve as anticipated.   Visit Diagnosis: 1. Malignant neoplasm of sigmoid colon ([place])   2.  Encounter for follow-up surveillance of colon cancer     Dr. Randa Evens, MD, MPH [facility] [phone] 05/24/2019 8:44 AM

## 2019-05-25 ENCOUNTER — Other Ambulatory Visit: Payer: Self-pay | Admitting: Nurse Practitioner

## 2019-05-25 DIAGNOSIS — E034 Atrophy of thyroid (acquired): Secondary | ICD-10-CM

## 2019-05-25 MED ORDER — LEVOTHYROXINE SODIUM 175 MCG PO TABS: 175.0000 ug | ORAL_TABLET | Freq: Every day | ORAL | 0 refills | Status: DC

## 2019-05-25 NOTE — Telephone Encounter (Signed)
Pt  Called requesting refill on levothyroxine called into  [facility]  [facility]

## 2019-05-28 ENCOUNTER — Ambulatory Visit: Payer: Managed Care, Other (non HMO) | Admitting: Oncology

## 2019-05-28 ENCOUNTER — Other Ambulatory Visit: Payer: Managed Care, Other (non HMO)

## 2019-06-21 ENCOUNTER — Ambulatory Visit
Admission: RE | Admit: 2019-06-21 | Discharge: 2019-06-21 | Disposition: A | Discharge status: Home / Self Care | Payer: Managed Care, Other (non HMO) | Source: Ambulatory Visit | Attending: Oncology | Admitting: Oncology

## 2019-06-21 ENCOUNTER — Other Ambulatory Visit: Payer: Self-pay

## 2019-06-21 DIAGNOSIS — C187 Malignant neoplasm of sigmoid colon: Secondary | ICD-10-CM | POA: Diagnosis not present

## 2019-06-21 MED ORDER — IOHEXOL 300 MG/ML  SOLN
100.0000 mL | Freq: Once | INTRAMUSCULAR | Status: AC | PRN
  Administered 2019-06-21: 100 mL via INTRAVENOUS

## 2019-06-25 ENCOUNTER — Inpatient Hospital Stay (HOSPITAL_BASED_OUTPATIENT_CLINIC_OR_DEPARTMENT_OTHER): Payer: Managed Care, Other (non HMO) | Admitting: Oncology

## 2019-06-25 ENCOUNTER — Other Ambulatory Visit: Payer: Self-pay

## 2019-06-25 ENCOUNTER — Encounter: Payer: Self-pay | Admitting: Oncology

## 2019-06-25 ENCOUNTER — Inpatient Hospital Stay: Payer: Managed Care, Other (non HMO) | Attending: Oncology

## 2019-06-25 VITALS — BP 160/85 | HR 93 | Temp 97.8°F | Resp 20 | Ht 67.0 in | Wt 206.0 lb

## 2019-06-25 DIAGNOSIS — Z8349 Family history of other endocrine, nutritional and metabolic diseases: Secondary | ICD-10-CM

## 2019-06-25 DIAGNOSIS — Z923 Personal history of irradiation: Secondary | ICD-10-CM | POA: Diagnosis not present

## 2019-06-25 DIAGNOSIS — E039 Hypothyroidism, unspecified: Secondary | ICD-10-CM | POA: Diagnosis not present

## 2019-06-25 DIAGNOSIS — Z08 Encounter for follow-up examination after completed treatment for malignant neoplasm: Secondary | ICD-10-CM

## 2019-06-25 DIAGNOSIS — G629 Polyneuropathy, unspecified: Secondary | ICD-10-CM

## 2019-06-25 DIAGNOSIS — Z79899 Other long term (current) drug therapy: Secondary | ICD-10-CM | POA: Diagnosis not present

## 2019-06-25 DIAGNOSIS — Z800 Family history of malignant neoplasm of digestive organs: Secondary | ICD-10-CM | POA: Diagnosis not present

## 2019-06-25 DIAGNOSIS — Z808 Family history of malignant neoplasm of other organs or systems: Secondary | ICD-10-CM | POA: Diagnosis not present

## 2019-06-25 DIAGNOSIS — R5383 Other fatigue: Secondary | ICD-10-CM | POA: Diagnosis not present

## 2019-06-25 DIAGNOSIS — Z90710 Acquired absence of both cervix and uterus: Secondary | ICD-10-CM

## 2019-06-25 DIAGNOSIS — R945 Abnormal results of liver function studies: Secondary | ICD-10-CM | POA: Diagnosis not present

## 2019-06-25 DIAGNOSIS — C187 Malignant neoplasm of sigmoid colon: Secondary | ICD-10-CM | POA: Diagnosis not present

## 2019-06-25 DIAGNOSIS — Z9221 Personal history of antineoplastic chemotherapy: Secondary | ICD-10-CM

## 2019-06-25 DIAGNOSIS — Z9049 Acquired absence of other specified parts of digestive tract: Secondary | ICD-10-CM | POA: Insufficient documentation

## 2019-06-25 DIAGNOSIS — Z803 Family history of malignant neoplasm of breast: Secondary | ICD-10-CM

## 2019-06-25 DIAGNOSIS — R7989 Other specified abnormal findings of blood chemistry: Secondary | ICD-10-CM

## 2019-06-25 DIAGNOSIS — Z8249 Family history of ischemic heart disease and other diseases of the circulatory system: Secondary | ICD-10-CM | POA: Diagnosis not present

## 2019-06-25 DIAGNOSIS — Z85038 Personal history of other malignant neoplasm of large intestine: Secondary | ICD-10-CM

## 2019-06-25 LAB — COMPREHENSIVE METABOLIC PANEL
ALT: 48 U/L — ABNORMAL HIGH (ref 0–44)
AST: 29 U/L (ref 15–41)
Albumin: 4 g/dL (ref 3.5–5.0)
Alkaline Phosphatase: 85 U/L (ref 38–126)
Anion gap: 11 (ref 5–15)
BUN: 9 mg/dL (ref 6–20)
CO2: 24 mmol/L (ref 22–32)
Calcium: 9.1 mg/dL (ref 8.9–10.3)
Chloride: 105 mmol/L (ref 98–111)
Creatinine, Ser: 0.59 mg/dL (ref 0.44–1.00)
GFR calc Af Amer: >60 mL/min (ref >60)
GFR calc non Af Amer: >60 mL/min (ref >60)
Glucose, Bld: 119 mg/dL — ABNORMAL HIGH (ref 70–99)
Potassium: 3.8 mmol/L (ref 3.5–5.1)
Sodium: 140 mmol/L (ref 135–145)
Total Bilirubin: 0.5 mg/dL (ref 0.3–1.2)
Total Protein: 7.2 g/dL (ref 6.5–8.1)

## 2019-06-25 LAB — CBC WITH DIFFERENTIAL/PLATELET
Abs Immature Granulocytes: 0.02 10*3/uL (ref 0.00–0.07)
Basophils Absolute: 0 10*3/uL (ref 0.0–0.1)
Basophils Relative: 0 %
Eosinophils Absolute: 0.1 10*3/uL (ref 0.0–0.5)
Eosinophils Relative: 1 %
HCT: 36.9 % (ref 36.0–46.0)
Hemoglobin: 12.5 g/dL (ref 12.0–15.0)
Immature Granulocytes: 1 %
Lymphocytes Relative: 31 %
Lymphs Abs: 1.2 10*3/uL (ref 0.7–4.0)
MCH: 30.5 pg (ref 26.0–34.0)
MCHC: 33.9 g/dL (ref 30.0–36.0)
MCV: 90 fL (ref 80.0–100.0)
Monocytes Absolute: 0.3 10*3/uL (ref 0.1–1.0)
Monocytes Relative: 9 %
Neutro Abs: 2.2 10*3/uL (ref 1.7–7.7)
Neutrophils Relative %: 58 %
Platelets: 175 10*3/uL (ref 150–400)
RBC: 4.1 MIL/uL (ref 3.87–5.11)
RDW: 13.2 % (ref 11.5–15.5)
WBC: 3.8 10*3/uL — ABNORMAL LOW (ref 4.0–10.5)
nRBC: 0 % (ref 0.0–0.2)

## 2019-06-25 NOTE — Progress Notes (Signed)
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 Vicente Males from GI for recurrent episodes of diverticulitis sinceaugust2018 which was treated with antibiotics. However patient continued to have some abdominal cramping along with some bleeding in her stool and constipation alternating with diarrhea. Patient underwent flexible sigmoidoscopy on 11/07/2017. A circumferential infiltrative completely obstructing large mass was found in the rectosigmoid colon.Biopsy showed at least high-grade dysplasia. Patient was referred to Dr. Derinda Sis consideration of hemicolectomy.  Patient underwent laparoscopic low anterior resection along with small bowel resection and laparoscopic takedown of the splenic flexure on 11/11/2017. 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 Tumor size was 3 cm, moderately differentiated. All margins were negative for invasive carcinoma. Perineural invasion and lymphovascular invasion was present. 1 out of 17 lymph nodes was positive for malignancy. Pathologic stage was pT4b pN1a. MSI stable  Preoperative CEA is not available. CT abdomen on 10/26/2017 showed residual changes of  diverticulitis although improved when compared to prior exam. No perforation or abscess is identified.  No family history of colon cancer. History of breast cancer in her mother in her 22s. Genetic testing was negative  Patient completed 10 cycles of FOLFOX chemotherapy in June 2019. oxaliplatin stopped after 9 cycles. She did not wish to complete 12 cycles due to neuropathy and fatigue. Patient also had RT given T4 disease   Interval history-she is doing well overall.  Appetite and weight are stable.  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  She had an episode of cellulitis last month which has now resolved.  Denies any abdominal pain or diarrhea or blood in stools.  ECOG PS- 0 Pain scale- 0   Review of systems- Review of Systems  Constitutional: Negative for chills, fever, malaise/fatigue and weight loss.  HENT: Negative for congestion, ear discharge and nosebleeds.   Eyes: Negative for blurred vision.  Respiratory: Negative for cough, hemoptysis, sputum production, shortness of breath and wheezing.   Cardiovascular: Negative for chest pain, palpitations, orthopnea and claudication.  Gastrointestinal: Negative for abdominal pain, blood in stool, constipation, diarrhea, heartburn, melena, nausea and vomiting.  Genitourinary: Negative for dysuria, flank pain, frequency, hematuria and urgency.  Musculoskeletal: Negative for back pain, joint pain and myalgias.  Skin: Negative for rash.  Neurological: Negative for dizziness, tingling, focal weakness, seizures, weakness and headaches.  Endo/Heme/Allergies: Does not bruise/bleed easily.  Psychiatric/Behavioral: Negative for depression and suicidal ideas. The patient does not have insomnia.       No Known Allergies   Past Medical History:  Diagnosis Date   Anemia    Cancer of sigmoid colon ([place]) 11/11/2017   Partial sigmoid colon resection, chemo + rad tx's.    Diverticulosis    Genetic testing 12/14/2017   Common Cancers panel (47  genes) @ [facility] - No pathogenic mutations detected   Headache    Heart  murmur    Hyperlipidemia    Hypothyroidism    Thyroid disease      Past Surgical History:  Procedure Laterality Date   APPENDECTOMY     possible during hysterectomy   BOWEL RESECTION N/A 11/11/2017   Procedure: SMALL BOWEL RESECTION;  Surgeon: Jules Husbands, MD;  Location: [facility] ORS;  Service: General;  Laterality: N/A;   COLONOSCOPY WITH PROPOFOL N/A 11/10/2018   Procedure: COLONOSCOPY WITH PROPOFOL;  Surgeon: Jonathon Bellows, MD;  Location: [facility] ENDOSCOPY;  Service: Gastroenterology;  Laterality: N/A;   FLEXIBLE SIGMOIDOSCOPY N/A 11/07/2017   Procedure: FLEXIBLE SIGMOIDOSCOPY;  Surgeon: Jonathon Bellows, MD;  Location: [facility] ENDOSCOPY;  Service: Gastroenterology;  Laterality: N/A;   LAPAROSCOPIC SIGMOID COLECTOMY N/A 11/11/2017   Procedure: LAPAROSCOPIC SIGMOID COLECTOMY;  Surgeon: Jules Husbands, MD;  Location: [facility] ORS;  Service: General;  Laterality: N/A;   MOUTH SURGERY     PORTACATH PLACEMENT Right 11/29/2017   Procedure: INSERTION PORT-A-CATH;  Surgeon: Jules Husbands, MD;  Location: [facility];  Service: General;  Laterality: Right;  May do local and IV sedation. Please have U/S   TOTAL ABDOMINAL HYSTERECTOMY  05/20/2014   ooperectomy unilateral    Social History   Socioeconomic History   Marital status: Married    Spouse name: Not on file   Number of children: Not on file   Years of education: Not on file   Highest education level: Not on file  Occupational History   Not on file  Social Needs   Financial resource strain: Not on file   Food insecurity    Worry: Not on file    Inability: Not on file   Transportation needs    Medical: Not on file    Non-medical: Not on file  Tobacco Use   Smoking status: Never Smoker   Smokeless tobacco: Never Used  Substance and Sexual Activity   Alcohol use: No    Alcohol/week: 0.0 standard drinks   Drug use: No   Sexual  activity: Yes  Lifestyle   Physical activity    Days per week: Not on file    Minutes per session: Not on file   Stress: Not on file  Relationships   Social connections    Talks on phone: Not on file    Gets together: Not on file    Attends religious service: Not on file    Active member of club or organization: Not on file    Attends meetings of clubs or organizations: Not on file    Relationship status: Not on file   Intimate partner violence    Fear of current or ex partner: Not on file    Emotionally abused: Not on file    Physically abused: Not on file    Forced sexual activity: Not on file  Other Topics Concern   Not on file  Social History Narrative   Not on file    Family History  Problem Relation Age of Onset   Breast cancer Mother 74       currently 68   Hypertension Mother    Thyroid disease Brother    Hernia Brother    Thyroid cancer Maternal Grandmother 33       deceased 29   Stomach cancer Paternal Grandfather        unconfirmed; deceased 87   Stomach cancer Paternal Aunt 62       deceased 22   Colon cancer Maternal Aunt 86  also lung cancer   Stomach cancer Maternal Uncle 45     Current Outpatient Medications:    APPLE CIDER VINEGAR PO, Take 1 tablet by mouth daily., Disp: , Rfl:    Biotin 1000 MCG tablet, Take 1,000 mcg by mouth daily., Disp: , Rfl:    Cyanocobalamin (VITAMIN B 12 PO), Take 2 Doses by mouth daily. , Disp: , Rfl:    levothyroxine (SYNTHROID) 175 MCG tablet, Take 1 tablet (175 mcg total) by mouth daily before breakfast., Disp: 90 tablet, Rfl: 0   Multiple Vitamin tablet, Take 2 tablets by mouth daily. , Disp: , Rfl:    OMEGA-3 FATTY ACIDS PO, Take 2 capsules by mouth daily. , Disp: , Rfl:    VITAMIN D, CHOLECALCIFEROL, PO, Take 1 tablet by mouth daily., Disp: , Rfl:  No current facility-administered medications for this visit.   Facility-Administered Medications Ordered in Other Visits:    heparin lock  flush 100 unit/mL, 500 Units, Intracatheter, PRN, Sindy Guadeloupe, MD   sodium chloride flush (NS) 0.9 % injection 10 mL, 10 mL, Intravenous, PRN, Sindy Guadeloupe, MD, 10 mL at 06/13/18 0844  Physical exam:  Vitals:   06/25/19 0950  BP: (!) 160/85  Pulse: 93  Resp: 20  Temp: 97.8 F (36.6 C)  TempSrc: Tympanic  Weight: 206 lb (93.4 kg)  Height: 5' 7"  (1.702 m)   Physical Exam Constitutional:      General: She is not in acute distress. HENT:     Head: Normocephalic and atraumatic.  Eyes:     Pupils: Pupils are equal, round, and reactive to light.  Neck:     Musculoskeletal: Normal range of motion.  Cardiovascular:     Rate and Rhythm: Normal rate and regular rhythm.     Heart sounds: Normal heart sounds.  Pulmonary:     Effort: Pulmonary effort is normal.     Breath sounds: Normal breath sounds.  Abdominal:     General: Bowel sounds are normal. There is no distension.     Palpations: Abdomen is soft.     Tenderness: There is no abdominal tenderness.  Skin:    General: Skin is warm and dry.  Neurological:     Mental Status: She is alert and oriented to person, place, and time.      CMP Latest Ref Rng & Units 05/22/2019  Glucose 70 - 99 mg/dL 99  BUN 6 - 20 mg/dL 11  Creatinine 0.44 - 1.00 mg/dL 0.68  Sodium 135 - 145 mmol/L 139  Potassium 3.5 - 5.1 mmol/L 3.8  Chloride 98 - 111 mmol/L 105  CO2 22 - 32 mmol/L 25  Calcium 8.9 - 10.3 mg/dL 9.3  Total Protein 6.5 - 8.1 g/dL 7.4  Total Bilirubin 0.3 - 1.2 mg/dL 0.6  Alkaline Phos 38 - 126 U/L 89  AST 15 - 41 U/L 28  ALT 0 - 44 U/L 59(H)   CBC Latest Ref Rng & Units 05/22/2019  WBC 4.0 - 10.5 K/uL 3.9(L)  Hemoglobin 12.0 - 15.0 g/dL 12.8  Hematocrit 36.0 - 46.0 % 38.5  Platelets 150 - 400 K/uL 185    No images are attached to the encounter.  Ct Chest W Contrast  Result Date: 06/21/2019 CLINICAL DATA:  Colon cancer surveillance, status post resection, adjuvant chemo and radiation EXAM: CT CHEST, ABDOMEN, AND PELVIS  WITH CONTRAST TECHNIQUE: Multidetector CT imaging of the chest, abdomen and pelvis was performed following the standard protocol during bolus administration of intravenous contrast. CONTRAST:  152m OMNIPAQUE IOHEXOL 300 MG/ML SOLN, additional oral enteric contrast COMPARISON:  CT chest abdomen pelvis, 12/26/2018 FINDINGS: CT CHEST FINDINGS Cardiovascular: No significant vascular findings. Normal heart size. No pericardial effusion. Mediastinum/Nodes: No enlarged mediastinal, hilar, or axillary lymph nodes. Thyroid gland, trachea, and esophagus demonstrate no significant findings. Lungs/Pleura: Lungs are clear. No pleural effusion or pneumothorax. Musculoskeletal: No chest wall mass or suspicious bone lesions identified. CT ABDOMEN PELVIS FINDINGS Hepatobiliary: No solid liver abnormality is seen. No gallstones, gallbladder wall thickening, or biliary dilatation. Pancreas: Unremarkable. No pancreatic ductal dilatation or surrounding inflammatory changes. Spleen: Normal in size without significant abnormality. Adrenals/Urinary Tract: Adrenal glands are unremarkable. Kidneys are normal, without renal calculi, solid lesion, or hydronephrosis. Bladder is unremarkable. Stomach/Bowel: Stomach is within normal limits. The appendix is surgically absent. Stable postoperative findings status post sigmoid resection and anastomosis. Vascular/Lymphatic: No significant vascular findings are present. No enlarged abdominal or pelvic lymph nodes. Reproductive: Status post hysterectomy. Other: No abdominal wall hernia or abnormality. No abdominopelvic ascites. Musculoskeletal: No acute or significant osseous findings. IMPRESSION: 1. Stable postoperative findings status post sigmoid colon resection and reanastomosis. No evidence of recurrent mass or lymphadenopathy. 2. No evidence of metastatic disease in the chest, abdomen, or pelvis. 3.  Status post appendectomy and hysterectomy. Electronically Signed   By: AEddie CandleM.D.   On:  06/21/2019 11:20   Ct Abdomen Pelvis W Contrast  Result Date: 06/21/2019 CLINICAL DATA:  Colon cancer surveillance, status post resection, adjuvant chemo and radiation EXAM: CT CHEST, ABDOMEN, AND PELVIS WITH CONTRAST TECHNIQUE: Multidetector CT imaging of the chest, abdomen and pelvis was performed following the standard protocol during bolus administration of intravenous contrast. CONTRAST:  1072mOMNIPAQUE IOHEXOL 300 MG/ML SOLN, additional oral enteric contrast COMPARISON:  CT chest abdomen pelvis, 12/26/2018 FINDINGS: CT CHEST FINDINGS Cardiovascular: No significant vascular findings. Normal heart size. No pericardial effusion. Mediastinum/Nodes: No enlarged mediastinal, hilar, or axillary lymph nodes. Thyroid gland, trachea, and esophagus demonstrate no significant findings. Lungs/Pleura: Lungs are clear. No pleural effusion or pneumothorax. Musculoskeletal: No chest wall mass or suspicious bone lesions identified. CT ABDOMEN PELVIS FINDINGS Hepatobiliary: No solid liver abnormality is seen. No gallstones, gallbladder wall thickening, or biliary dilatation. Pancreas: Unremarkable. No pancreatic ductal dilatation or surrounding inflammatory changes. Spleen: Normal in size without significant abnormality. Adrenals/Urinary Tract: Adrenal glands are unremarkable. Kidneys are normal, without renal calculi, solid lesion, or hydronephrosis. Bladder is unremarkable. Stomach/Bowel: Stomach is within normal limits. The appendix is surgically absent. Stable postoperative findings status post sigmoid resection and anastomosis. Vascular/Lymphatic: No significant vascular findings are present. No enlarged abdominal or pelvic lymph nodes. Reproductive: Status post hysterectomy. Other: No abdominal wall hernia or abnormality. No abdominopelvic ascites. Musculoskeletal: No acute or significant osseous findings. IMPRESSION: 1. Stable postoperative findings status post sigmoid colon resection and reanastomosis. No evidence of  recurrent mass or lymphadenopathy. 2. No evidence of metastatic disease in the chest, abdomen, or pelvis. 3.  Status post appendectomy and hysterectomy. Electronically Signed   By: AlEddie Candle.D.   On: 06/21/2019 11:20     Assessment and plan- Patient is a [age].o. 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 she is here to discuss the results of her CT scan which was done as a part of surveillance imaging  I have reviewed CT chest abdomen pelvis images independently and discussed findings with the patient.  Patient is status post adjuvant chemotherapy as well as  radiation treatment a year ago.  CT scans show no evidence of recurrence of metastatic disease.  She also has a colonoscopy that would be due in 2 years time.  And follows up with GI for the same.  I will see her back in 6 months time with CBC with differential, CMP and CEA and a CT chest abdomen and pelvis with contrast prior.  Patient did have abnormal LFTs and her AST was mildly elevated at 56 which has come down to 48.  Patient states that she does have repeat labs in November by her primary care doctor and would just like to get a repeat CMP at that time.  Continue to monitor   Visit Diagnosis 1. Encounter for follow-up surveillance of colon cancer   2. Abnormal LFTs      Dr. Randa Evens, MD, MPH [facility] at [facility] [phone] 06/25/2019 10:30 AM

## 2019-07-20 ENCOUNTER — Other Ambulatory Visit: Payer: Self-pay

## 2019-07-20 DIAGNOSIS — C187 Malignant neoplasm of sigmoid colon: Secondary | ICD-10-CM

## 2019-07-23 ENCOUNTER — Inpatient Hospital Stay: Payer: Managed Care, Other (non HMO) | Attending: Oncology

## 2019-08-22 ENCOUNTER — Other Ambulatory Visit: Payer: Self-pay | Admitting: Family Medicine

## 2019-08-22 DIAGNOSIS — E034 Atrophy of thyroid (acquired): Secondary | ICD-10-CM

## 2019-10-24 ENCOUNTER — Encounter: Payer: Managed Care, Other (non HMO) | Admitting: Nurse Practitioner

## 2019-10-26 ENCOUNTER — Encounter: Payer: Self-pay | Admitting: Family Medicine

## 2019-10-26 ENCOUNTER — Ambulatory Visit (INDEPENDENT_AMBULATORY_CARE_PROVIDER_SITE_OTHER): Payer: Managed Care, Other (non HMO) | Admitting: Family Medicine

## 2019-10-26 ENCOUNTER — Other Ambulatory Visit: Payer: Self-pay

## 2019-10-26 VITALS — BP 150/78 | HR 83 | Temp 99.0°F | Resp 16 | Ht 67.0 in | Wt 212.0 lb

## 2019-10-26 DIAGNOSIS — R7309 Other abnormal glucose: Secondary | ICD-10-CM

## 2019-10-26 DIAGNOSIS — E034 Atrophy of thyroid (acquired): Secondary | ICD-10-CM

## 2019-10-26 DIAGNOSIS — C187 Malignant neoplasm of sigmoid colon: Secondary | ICD-10-CM | POA: Diagnosis not present

## 2019-10-26 DIAGNOSIS — E782 Mixed hyperlipidemia: Secondary | ICD-10-CM

## 2019-10-26 DIAGNOSIS — D701 Agranulocytosis secondary to cancer chemotherapy: Secondary | ICD-10-CM

## 2019-10-26 DIAGNOSIS — E039 Hypothyroidism, unspecified: Secondary | ICD-10-CM

## 2019-10-26 DIAGNOSIS — Z0000 Encounter for general adult medical examination without abnormal findings: Secondary | ICD-10-CM

## 2019-10-26 DIAGNOSIS — G620 Drug-induced polyneuropathy: Secondary | ICD-10-CM

## 2019-10-26 DIAGNOSIS — E669 Obesity, unspecified: Secondary | ICD-10-CM

## 2019-10-26 DIAGNOSIS — E66811 Obesity, class 1: Secondary | ICD-10-CM

## 2019-10-26 NOTE — Patient Instructions (Addendum)
Thank you for coming to the office today.  Goal BP < 140 / 90 - check a few times a week, write down numbers, contact us within 2-4 weeks to discuss. If need can mychart message briefly or do a virtual telephone call if need more.  Labs ordered today, stay tuned for results.  Will re order levothyroxine med once look at test result, for 90 day with refills  Please schedule a Follow-up Appointment to: Return in about 1 year (around 10/25/2020) for Annual Physical.  If you have any other questions or concerns, please feel free to call the office or send a message through [place]. You may also schedule an earlier appointment if necessary.  Additionally, you may be receiving a survey about your experience at our office within a few days to 1 week by e-mail or mail. We value your feedback.  [name], DO [place]

## 2019-10-26 NOTE — Progress Notes (Addendum)
Subjective:    Patient ID: Regina Patel, female    DOB: [birth year], 48 y.o.   MRN: [id number]  Regina Patel is a 48 y.o. female presenting on 10/26/2019 for Annual Exam   HPI   Previous PCP Cassell Smiles, AGPCNP-BC   Here for Annual Physical and Due for labs  HEALTH MAINTENANCE: Patient has been feeling well Weight/BMI: stable Physical activity: walking Diet: balanced PAP: hysterectomy, Partial LEFT ovary.  Removed RIGHT for tumor.  Night sweats.   Mammogram: due in 11/2019 Colon Cancer Screen: done repeat on 11/10/2018  Follow-up Hypothyroidism Due for lab TSH, no new concerns on current med, she continues levothyroxine 152mcg daily   Health Maintenance: Due Flu vaccine. Declines  Depression screen [facility] 2/9 10/26/2019 10/20/2018 07/20/2017  Decreased Interest 0 0 0  Down, Depressed, Hopeless 0 0 0  PHQ - 2 Score 0 0 0    Past Medical History:  Diagnosis Date   Anemia    Cancer of sigmoid colon ([place]) 11/11/2017   Partial sigmoid colon resection, chemo + rad tx's.    Diverticulosis    Genetic testing 12/14/2017   Common Cancers panel (47 genes) @ [facility] - No pathogenic mutations detected   Headache    Heart murmur    Hyperlipidemia    Hypothyroidism    Thyroid disease    Past Surgical History:  Procedure Laterality Date   APPENDECTOMY     possible during hysterectomy   BOWEL RESECTION N/A 11/11/2017   Procedure: SMALL BOWEL RESECTION;  Surgeon: Jules Husbands, MD;  Location: [facility] ORS;  Service: General;  Laterality: N/A;   COLONOSCOPY WITH PROPOFOL N/A 11/10/2018   Procedure: COLONOSCOPY WITH PROPOFOL;  Surgeon: Jonathon Bellows, MD;  Location: [facility] ENDOSCOPY;  Service: Gastroenterology;  Laterality: N/A;   FLEXIBLE SIGMOIDOSCOPY N/A 11/07/2017   Procedure: FLEXIBLE SIGMOIDOSCOPY;  Surgeon: Jonathon Bellows, MD;  Location: [facility] ENDOSCOPY;  Service: Gastroenterology;  Laterality: N/A;   LAPAROSCOPIC SIGMOID COLECTOMY N/A 11/11/2017   Procedure: LAPAROSCOPIC  SIGMOID COLECTOMY;  Surgeon: Jules Husbands, MD;  Location: [facility] ORS;  Service: General;  Laterality: N/A;   MOUTH SURGERY     PORTACATH PLACEMENT Right 11/29/2017   Procedure: INSERTION PORT-A-CATH;  Surgeon: Jules Husbands, MD;  Location: [place];  Service: General;  Laterality: Right;  May do local and IV sedation. Please have U/S   TOTAL ABDOMINAL HYSTERECTOMY  05/20/2014   ooperectomy unilateral   Social History   Socioeconomic History   Marital status: Married    Spouse name: Not on file   Number of children: Not on file   Years of education: Not on file   Highest education level: Not on file  Occupational History   Not on file  Social Needs   Financial resource strain: Not on file   Food insecurity    Worry: Not on file    Inability: Not on file   Transportation needs    Medical: Not on file    Non-medical: Not on file  Tobacco Use   Smoking status: Never Smoker   Smokeless tobacco: Never Used  Substance and Sexual Activity   Alcohol use: No    Alcohol/week: 0.0 standard drinks   Drug use: No   Sexual activity: Yes  Lifestyle   Physical activity    Days per week: Not on file    Minutes per session: Not on file   Stress: Not on file  Relationships   Social connections    Talks on phone: Not on  file    Gets together: Not on file    Attends religious service: Not on file    Active member of club or organization: Not on file    Attends meetings of clubs or organizations: Not on file    Relationship status: Not on file   Intimate partner violence    Fear of current or ex partner: Not on file    Emotionally abused: Not on file    Physically abused: Not on file    Forced sexual activity: Not on file  Other Topics Concern   Not on file  Social History Narrative   Not on file   Family History  Problem Relation Age of Onset   Breast cancer Mother 41       currently 77   Hypertension Mother    Thyroid disease Brother     Hernia Brother    Thyroid cancer Maternal Grandmother 10       deceased 73   Stomach cancer Paternal Grandfather        unconfirmed; deceased 45   Stomach cancer Paternal Aunt 62       deceased 58   Colon cancer Maternal Aunt 68       also lung cancer   Stomach cancer Maternal Uncle 40   Current Outpatient Medications on File Prior to Visit  Medication Sig   APPLE CIDER VINEGAR PO Take 1 tablet by mouth daily.   Biotin 1000 MCG tablet Take 1,000 mcg by mouth daily.   Cyanocobalamin (VITAMIN B 12 PO) Take 2 Doses by mouth daily.    Multiple Vitamin tablet Take 2 tablets by mouth daily.    OMEGA-3 FATTY ACIDS PO Take 2 capsules by mouth daily.    VITAMIN D, CHOLECALCIFEROL, PO Take 1 tablet by mouth daily.   Current Facility-Administered Medications on File Prior to Visit  Medication   heparin lock flush 100 unit/mL   sodium chloride flush (NS) 0.9 % injection 10 mL    Review of Systems  Constitutional: Negative for activity change, appetite change, chills, diaphoresis, fatigue and fever.  HENT: Negative for congestion and hearing loss.   Eyes: Negative for visual disturbance.  Respiratory: Negative for cough, chest tightness, shortness of breath and wheezing.   Cardiovascular: Negative for chest pain, palpitations and leg swelling.  Gastrointestinal: Negative for abdominal pain, constipation, diarrhea, nausea and vomiting.  Genitourinary: Negative for dysuria, frequency and hematuria.  Musculoskeletal: Negative for arthralgias and neck pain.  Skin: Negative for rash.  Neurological: Negative for dizziness, weakness, light-headedness, numbness and headaches.  Hematological: Negative for adenopathy.  Psychiatric/Behavioral: Negative for behavioral problems, dysphoric mood and sleep disturbance.   Per HPI unless specifically indicated above     Objective:    BP (!) 150/78 (BP Location: Left Arm, Patient Position: Sitting, Cuff Size: Normal) Comment: manual   Pulse  83    Temp 99 F (37.2 C) (Oral)    Resp 16    Ht 5\' 7"  (1.702 m)    Wt 212 lb (96.2 kg)    BMI 33.20 kg/m   Wt Readings from Last 3 Encounters:  10/26/19 212 lb (96.2 kg)  06/25/19 206 lb (93.4 kg)  01/04/19 207 lb 8 oz (94.1 kg)    Physical Exam Vitals signs and nursing note reviewed.  Constitutional:      General: She is not in acute distress.    Appearance: She is well-developed. She is not diaphoretic.     Comments: Well-appearing, comfortable, cooperative  HENT:  Head: Normocephalic and atraumatic.  Eyes:     General:        Right eye: No discharge.        Left eye: No discharge.     Conjunctiva/sclera: Conjunctivae normal.     Pupils: Pupils are equal, round, and reactive to light.  Neck:     Musculoskeletal: Normal range of motion and neck supple.     Thyroid: No thyromegaly.  Cardiovascular:     Rate and Rhythm: Normal rate and regular rhythm.     Heart sounds: Normal heart sounds. No murmur.  Pulmonary:     Effort: Pulmonary effort is normal. No respiratory distress.     Breath sounds: Normal breath sounds. No wheezing or rales.  Abdominal:     General: Bowel sounds are normal. There is no distension.     Palpations: Abdomen is soft. There is no mass.     Tenderness: There is no abdominal tenderness.  Musculoskeletal: Normal range of motion.        General: No tenderness.     Comments: Upper / Lower Extremities: - Normal muscle tone, strength bilateral upper extremities 5/5, lower extremities 5/5  Lymphadenopathy:     Cervical: No cervical adenopathy.  Skin:    General: Skin is warm and dry.     Findings: No erythema or rash.  Neurological:     Mental Status: She is alert and oriented to person, place, and time.     Comments: Distal sensation intact to light touch all extremities  Psychiatric:        Behavior: Behavior normal.     Comments: Well groomed, good eye contact, normal speech and thoughts    Results for orders placed or performed in visit on  10/26/19  [facility] - A1c LAB Hemoglobin A1C physical  Result Value Ref Range   Hgb A1c MFr Bld 5.4 <5.7 % of total Hgb   Mean Plasma Glucose 108 (calc)   eAG (mmol/L) 6.0 (calc)  [facility] - CBC with Differential/Platelet physical  Result Value Ref Range   WBC 3.2 (L) 3.8 - 10.8 Thousand/uL   RBC 4.54 3.80 - 5.10 Million/uL   Hemoglobin 13.4 11.7 - 15.5 g/dL   HCT 41.2 35.0 - 45.0 %   MCV 90.7 80.0 - 100.0 fL   MCH 29.5 27.0 - 33.0 pg   MCHC 32.5 32.0 - 36.0 g/dL   RDW 13.6 11.0 - 15.0 %   Platelets 183 140 - 400 Thousand/uL   MPV 9.9 7.5 - 12.5 fL   Neutro Abs 1,699 1,500 - 7,800 cells/uL   Lymphs Abs 1,187 850 - 3,900 cells/uL   Absolute Monocytes 253 200 - 950 cells/uL   Eosinophils Absolute 51 15 - 500 cells/uL   Basophils Absolute 10 0 - 200 cells/uL   Neutrophils Relative % 53.1 %   Total Lymphocyte 37.1 %   Monocytes Relative 7.9 %   Eosinophils Relative 1.6 %   Basophils Relative 0.3 %  [facility] - CMET w/ GFR CMP Complete Metabolic Panel physical  Result Value Ref Range   Glucose, Bld 89 65 - 99 mg/dL   BUN 9 7 - 25 mg/dL   Creat 0.62 0.50 - 1.10 mg/dL   GFR, Est Non African American 106 > OR = 60 mL/min/1.42m2   GFR, Est African American 123 > OR = 60 mL/min/1.22m2   BUN/Creatinine Ratio NOT APPLICABLE 6 - 22 (calc)   Sodium 140 135 - 146 mmol/L   Potassium 4.0 3.5 - 5.3 mmol/L  Chloride 105 98 - 110 mmol/L   CO2 27 20 - 32 mmol/L   Calcium 9.6 8.6 - 10.2 mg/dL   Total Protein 6.8 6.1 - 8.1 g/dL   Albumin 4.3 3.6 - 5.1 g/dL   Globulin 2.5 1.9 - 3.7 g/dL (calc)   AG Ratio 1.7 1.0 - 2.5 (calc)   Total Bilirubin 0.4 0.2 - 1.2 mg/dL   Alkaline phosphatase (APISO) 82 31 - 125 U/L   AST 22 10 - 35 U/L   ALT 36 (H) 6 - 29 U/L  [facility] - Lipid panel physical  Result Value Ref Range   Cholesterol 249 (H) <200 mg/dL   HDL 54 > OR = 50 mg/dL   Triglycerides 143 <150 mg/dL   LDL Cholesterol (Calc) 166 (H) mg/dL (calc)   Total CHOL/HDL Ratio 4.6 <5.0 (calc)   Non-HDL Cholesterol  (Calc) 195 (H) <130 mg/dL (calc)  [facility] - TSH  Result Value Ref Range   TSH 0.96 mIU/L  [facility] - T4, free  Result Value Ref Range   Free T4 1.7 0.8 - 1.8 ng/dL      Assessment & Plan:   Problem List Items Addressed This Visit    Obesity (BMI 30.0-34.9)   Hypothyroidism   Relevant Medications   levothyroxine (SYNTHROID) 175 MCG tablet   Other Relevant Orders   [facility] - TSH (Completed)   [place] - T4, free (Completed)   Combined fat and carbohydrate induced hyperlipemia   Relevant Orders   [place] - Lipid panel physical (Completed)   Cancer of sigmoid ([place])   Relevant Orders   [place] - CBC with Differential/Platelet physical (Completed)   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[place])    Other Visit Diagnoses    Annual physical exam    -  Primary   Relevant Orders   [place] - A1c LAB Hemoglobin A1C physical (Completed)   [place] - CBC with Differential/Platelet physical (Completed)   [place] - CMET w/ GFR CMP Complete Metabolic Panel physical (Completed)   [place] - Lipid panel physical (Completed)   Abnormal glucose       Relevant Orders   [place] - A1c LAB Hemoglobin A1C physical (Completed)   Chemotherapy-induced peripheral neuropathy ([place])   (Chronic)        #Colon Cancer Followed by [facility] - Dr Janese Banks Has surveillance CT and recent colonoscopy  Updated Health Maintenance information Reviewed recent lab results with patient Encouraged improvement to lifestyle with diet and exercise - Goal of weight loss  Meds ordered this encounter  Medications   levothyroxine (SYNTHROID) 175 MCG tablet    Sig: Take 1 tablet (175 mcg total) by mouth daily before breakfast.    Dispense:  90 tablet    Refill:  3    Follow up plan: Return in about 1 year (around 10/25/2020) for Annual Physical.  [name], DO [facility] 10/26/2019, 9:04 AM

## 2019-10-27 DIAGNOSIS — D701 Agranulocytosis secondary to cancer chemotherapy: Secondary | ICD-10-CM | POA: Insufficient documentation

## 2019-10-27 LAB — HEMOGLOBIN A1C
Hgb A1c MFr Bld: 5.4 % of total Hgb (ref <5.7)
Mean Plasma Glucose: 108 (calc)
eAG (mmol/L): 6 (calc)

## 2019-10-27 LAB — COMPLETE METABOLIC PANEL WITH GFR
AG Ratio: 1.7 (calc) (ref 1.0–2.5)
ALT: 36 U/L — ABNORMAL HIGH (ref 6–29)
AST: 22 U/L (ref 10–35)
Albumin: 4.3 g/dL (ref 3.6–5.1)
Alkaline phosphatase (APISO): 82 U/L (ref 31–125)
BUN: 9 mg/dL (ref 7–25)
CO2: 27 mmol/L (ref 20–32)
Calcium: 9.6 mg/dL (ref 8.6–10.2)
Chloride: 105 mmol/L (ref 98–110)
Creat: 0.62 mg/dL (ref 0.50–1.10)
GFR, Est African American: 123 mL/min/{1.73_m2} (ref >=60)
GFR, Est Non African American: 106 mL/min/{1.73_m2} (ref >=60)
Globulin: 2.5 g/dL (calc) (ref 1.9–3.7)
Glucose, Bld: 89 mg/dL (ref 65–99)
Potassium: 4 mmol/L (ref 3.5–5.3)
Sodium: 140 mmol/L (ref 135–146)
Total Bilirubin: 0.4 mg/dL (ref 0.2–1.2)
Total Protein: 6.8 g/dL (ref 6.1–8.1)

## 2019-10-27 LAB — CBC WITH DIFFERENTIAL/PLATELET
Absolute Monocytes: 253 cells/uL (ref 200–950)
Basophils Absolute: 10 cells/uL (ref 0–200)
Basophils Relative: 0.3 %
Eosinophils Absolute: 51 cells/uL (ref 15–500)
Eosinophils Relative: 1.6 %
HCT: 41.2 % (ref 35.0–45.0)
Hemoglobin: 13.4 g/dL (ref 11.7–15.5)
Lymphs Abs: 1187 cells/uL (ref 850–3900)
MCH: 29.5 pg (ref 27.0–33.0)
MCHC: 32.5 g/dL (ref 32.0–36.0)
MCV: 90.7 fL (ref 80.0–100.0)
MPV: 9.9 fL (ref 7.5–12.5)
Monocytes Relative: 7.9 %
Neutro Abs: 1699 cells/uL (ref 1500–7800)
Neutrophils Relative %: 53.1 %
Platelets: 183 10*3/uL (ref 140–400)
RBC: 4.54 10*6/uL (ref 3.80–5.10)
RDW: 13.6 % (ref 11.0–15.0)
Total Lymphocyte: 37.1 %
WBC: 3.2 10*3/uL — ABNORMAL LOW (ref 3.8–10.8)

## 2019-10-27 LAB — LIPID PANEL
Cholesterol: 249 mg/dL — ABNORMAL HIGH (ref <200)
HDL: 54 mg/dL (ref >=50)
LDL Cholesterol (Calc): 166 mg/dL (calc) — ABNORMAL HIGH
Non-HDL Cholesterol (Calc): 195 mg/dL (calc) — ABNORMAL HIGH (ref <130)
Total CHOL/HDL Ratio: 4.6 (calc) (ref <5.0)
Triglycerides: 143 mg/dL (ref <150)

## 2019-10-27 LAB — TSH: TSH: 0.96 mIU/L

## 2019-10-27 LAB — T4, FREE: Free T4: 1.7 ng/dL (ref 0.8–1.8)

## 2019-10-27 MED ORDER — LEVOTHYROXINE SODIUM 175 MCG PO TABS: 175.0000 ug | ORAL_TABLET | Freq: Every day | ORAL | 3 refills | Status: DC

## 2019-10-27 NOTE — Addendum Note (Signed)
Addended by: Olin Hauser on: 10/27/2019 10:49 AM   Modules accepted: Orders

## 2019-11-30 DIAGNOSIS — C187 Malignant neoplasm of sigmoid colon: Secondary | ICD-10-CM

## 2019-11-30 NOTE — Research (Signed)
Called patient in regards to her participation in the [id number] protocol.Reminded her that her surveys should be in her e-mail now, or in the near future for her to complete. Message left for patient on secure voice mail for her to return call to the research office when she completes her surveys or if she doesn't receive them. This is her 24 month follow up visit and the last follow up visit for the study. Research nurse will also check the [place] website for verification of survey completion.  Jeral Fruit, RN, BSN, OCN 11/30/2019 1202 pm.

## 2019-12-05 ENCOUNTER — Ambulatory Visit
Admission: RE | Admit: 2019-12-05 | Discharge: 2019-12-05 | Disposition: A | Discharge status: Home / Self Care | Payer: Managed Care, Other (non HMO) | Source: Ambulatory Visit | Attending: Oncology | Admitting: Oncology

## 2019-12-05 ENCOUNTER — Ambulatory Visit: Payer: Managed Care, Other (non HMO)

## 2019-12-05 ENCOUNTER — Other Ambulatory Visit: Payer: Self-pay

## 2019-12-05 DIAGNOSIS — Z08 Encounter for follow-up examination after completed treatment for malignant neoplasm: Secondary | ICD-10-CM | POA: Diagnosis not present

## 2019-12-05 DIAGNOSIS — Z85038 Personal history of other malignant neoplasm of large intestine: Secondary | ICD-10-CM | POA: Insufficient documentation

## 2019-12-05 MED ORDER — IOHEXOL 300 MG/ML  SOLN
100.0000 mL | Freq: Once | INTRAMUSCULAR | Status: AC | PRN
  Administered 2019-12-05: 100 mL via INTRAVENOUS

## 2019-12-26 ENCOUNTER — Other Ambulatory Visit: Payer: Self-pay

## 2019-12-26 ENCOUNTER — Inpatient Hospital Stay: Payer: Managed Care, Other (non HMO) | Attending: Oncology | Admitting: *Deleted

## 2019-12-26 DIAGNOSIS — K5900 Constipation, unspecified: Secondary | ICD-10-CM | POA: Insufficient documentation

## 2019-12-26 DIAGNOSIS — Z801 Family history of malignant neoplasm of trachea, bronchus and lung: Secondary | ICD-10-CM | POA: Diagnosis not present

## 2019-12-26 DIAGNOSIS — Z8249 Family history of ischemic heart disease and other diseases of the circulatory system: Secondary | ICD-10-CM | POA: Insufficient documentation

## 2019-12-26 DIAGNOSIS — Z923 Personal history of irradiation: Secondary | ICD-10-CM | POA: Insufficient documentation

## 2019-12-26 DIAGNOSIS — Z9221 Personal history of antineoplastic chemotherapy: Secondary | ICD-10-CM | POA: Diagnosis not present

## 2019-12-26 DIAGNOSIS — Z95828 Presence of other vascular implants and grafts: Secondary | ICD-10-CM

## 2019-12-26 DIAGNOSIS — Z800 Family history of malignant neoplasm of digestive organs: Secondary | ICD-10-CM | POA: Insufficient documentation

## 2019-12-26 DIAGNOSIS — Z803 Family history of malignant neoplasm of breast: Secondary | ICD-10-CM | POA: Insufficient documentation

## 2019-12-26 DIAGNOSIS — R945 Abnormal results of liver function studies: Secondary | ICD-10-CM

## 2019-12-26 DIAGNOSIS — Z85038 Personal history of other malignant neoplasm of large intestine: Secondary | ICD-10-CM | POA: Insufficient documentation

## 2019-12-26 DIAGNOSIS — Z79899 Other long term (current) drug therapy: Secondary | ICD-10-CM | POA: Diagnosis not present

## 2019-12-26 DIAGNOSIS — E785 Hyperlipidemia, unspecified: Secondary | ICD-10-CM | POA: Insufficient documentation

## 2019-12-26 DIAGNOSIS — E039 Hypothyroidism, unspecified: Secondary | ICD-10-CM | POA: Diagnosis not present

## 2019-12-26 DIAGNOSIS — Z9049 Acquired absence of other specified parts of digestive tract: Secondary | ICD-10-CM | POA: Diagnosis not present

## 2019-12-26 DIAGNOSIS — C187 Malignant neoplasm of sigmoid colon: Secondary | ICD-10-CM

## 2019-12-26 DIAGNOSIS — Z8349 Family history of other endocrine, nutritional and metabolic diseases: Secondary | ICD-10-CM | POA: Diagnosis not present

## 2019-12-26 DIAGNOSIS — R7989 Other specified abnormal findings of blood chemistry: Secondary | ICD-10-CM

## 2019-12-26 DIAGNOSIS — Z08 Encounter for follow-up examination after completed treatment for malignant neoplasm: Secondary | ICD-10-CM

## 2019-12-26 LAB — COMPREHENSIVE METABOLIC PANEL
ALT: 79 U/L — ABNORMAL HIGH (ref 0–44)
AST: 44 U/L — ABNORMAL HIGH (ref 15–41)
Albumin: 4.1 g/dL (ref 3.5–5.0)
Alkaline Phosphatase: 101 U/L (ref 38–126)
Anion gap: 10 (ref 5–15)
BUN: 13 mg/dL (ref 6–20)
CO2: 24 mmol/L (ref 22–32)
Calcium: 9 mg/dL (ref 8.9–10.3)
Chloride: 103 mmol/L (ref 98–111)
Creatinine, Ser: 0.66 mg/dL (ref 0.44–1.00)
GFR calc Af Amer: >60 mL/min (ref >60)
GFR calc non Af Amer: >60 mL/min (ref >60)
Glucose, Bld: 128 mg/dL — ABNORMAL HIGH (ref 70–99)
Potassium: 4.1 mmol/L (ref 3.5–5.1)
Sodium: 137 mmol/L (ref 135–145)
Total Bilirubin: 0.5 mg/dL (ref 0.3–1.2)
Total Protein: 7.3 g/dL (ref 6.5–8.1)

## 2019-12-26 LAB — CBC WITH DIFFERENTIAL/PLATELET
Abs Immature Granulocytes: 0.01 10*3/uL (ref 0.00–0.07)
Basophils Absolute: 0 10*3/uL (ref 0.0–0.1)
Basophils Relative: 0 %
Eosinophils Absolute: 0.1 10*3/uL (ref 0.0–0.5)
Eosinophils Relative: 1 %
HCT: 40.5 % (ref 36.0–46.0)
Hemoglobin: 12.9 g/dL (ref 12.0–15.0)
Immature Granulocytes: 0 %
Lymphocytes Relative: 34 %
Lymphs Abs: 1.3 10*3/uL (ref 0.7–4.0)
MCH: 29.2 pg (ref 26.0–34.0)
MCHC: 31.9 g/dL (ref 30.0–36.0)
MCV: 91.6 fL (ref 80.0–100.0)
Monocytes Absolute: 0.3 10*3/uL (ref 0.1–1.0)
Monocytes Relative: 7 %
Neutro Abs: 2.1 10*3/uL (ref 1.7–7.7)
Neutrophils Relative %: 58 %
Platelets: 177 10*3/uL (ref 150–400)
RBC: 4.42 MIL/uL (ref 3.87–5.11)
RDW: 13.2 % (ref 11.5–15.5)
WBC: 3.8 10*3/uL — ABNORMAL LOW (ref 4.0–10.5)
nRBC: 0 % (ref 0.0–0.2)

## 2019-12-26 MED ORDER — SODIUM CHLORIDE 0.9% FLUSH
10.0000 mL | Freq: Once | INTRAVENOUS | Status: DC
  Filled 2019-12-26: qty 10

## 2019-12-26 MED ORDER — HEPARIN SOD (PORK) LOCK FLUSH 100 UNIT/ML IV SOLN
500.0000 [IU] | Freq: Once | INTRAVENOUS | Status: DC
  Filled 2019-12-26: qty 5

## 2019-12-27 ENCOUNTER — Inpatient Hospital Stay: Payer: Managed Care, Other (non HMO)

## 2019-12-27 ENCOUNTER — Inpatient Hospital Stay (HOSPITAL_BASED_OUTPATIENT_CLINIC_OR_DEPARTMENT_OTHER): Payer: Managed Care, Other (non HMO) | Admitting: Oncology

## 2019-12-27 ENCOUNTER — Encounter: Payer: Self-pay | Admitting: Oncology

## 2019-12-27 DIAGNOSIS — Z85038 Personal history of other malignant neoplasm of large intestine: Secondary | ICD-10-CM | POA: Diagnosis not present

## 2019-12-27 DIAGNOSIS — Z8249 Family history of ischemic heart disease and other diseases of the circulatory system: Secondary | ICD-10-CM

## 2019-12-27 DIAGNOSIS — Z803 Family history of malignant neoplasm of breast: Secondary | ICD-10-CM

## 2019-12-27 DIAGNOSIS — G629 Polyneuropathy, unspecified: Secondary | ICD-10-CM | POA: Diagnosis not present

## 2019-12-27 DIAGNOSIS — Z923 Personal history of irradiation: Secondary | ICD-10-CM

## 2019-12-27 DIAGNOSIS — Z79899 Other long term (current) drug therapy: Secondary | ICD-10-CM

## 2019-12-27 DIAGNOSIS — Z08 Encounter for follow-up examination after completed treatment for malignant neoplasm: Secondary | ICD-10-CM

## 2019-12-27 DIAGNOSIS — E039 Hypothyroidism, unspecified: Secondary | ICD-10-CM

## 2019-12-27 DIAGNOSIS — Z9221 Personal history of antineoplastic chemotherapy: Secondary | ICD-10-CM

## 2019-12-27 LAB — CEA: CEA: 0.7 ng/mL (ref 0.0–4.7)

## 2019-12-29 NOTE — Progress Notes (Signed)
I connected with Regina Patel on 12/29/19 at 11:30 AM EST by video enabled telemedicine visit and verified that I am speaking with the correct person using two identifiers.   I discussed the limitations, risks, security and privacy concerns of performing an evaluation and management service by telemedicine and the availability of in-person appointments. I also discussed with the patient that there may be a patient responsible charge related to this service. The patient expressed understanding and agreed to proceed.  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 Vicente Males from GI for recurrent episodes of diverticulitis sinceaugust2018 which was treated with antibiotics. However patient continued to have some abdominal cramping along with some bleeding in her stool and constipation alternating with diarrhea. Patient underwent flexible sigmoidoscopy on 11/07/2017. A circumferential infiltrative completely obstructing large mass was found in the rectosigmoid colon.Biopsy showed at least high-grade dysplasia. Patient was referred to Dr. Derinda Sis consideration of hemicolectomy.  Patient underwent laparoscopic low anterior resection along with small bowel resection and laparoscopic takedown of the splenic flexure on 11/11/2017. 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 Tumor size was 3 cm, moderately differentiated. All margins were negative for invasive carcinoma. Perineural invasion and lymphovascular invasion was present. 1 out of 17 lymph nodes was positive for malignancy. Pathologic  stage was pT4b pN1a. MSI stable  Preoperative CEA is not available. CT abdomen on 10/26/2017 showed residual changes of diverticulitis although improved when compared to prior exam. No perforation or abscess is identified.  No family history of colon cancer. History of breast cancer in her mother in her 42s. Genetic testing was negative  Patient completed 10 cycles of FOLFOX chemotherapy in June 2019. oxaliplatin stopped after 9 cycles. She did not wish to complete 12 cycles due to neuropathy and fatigue. Patient also had RT given T4 disease   Interval history he is doing well overall.  Denies any abdominal pain or unintentional weight loss.  Denies any blood in her sinus.   Review of Systems  Constitutional: Negative for chills, fever, malaise/fatigue and weight loss.  HENT: Negative for congestion, ear discharge and nosebleeds.   Eyes: Negative for blurred vision.  Respiratory: Negative for cough, hemoptysis, sputum production, shortness of breath and wheezing.   Cardiovascular: Negative for chest pain, palpitations, orthopnea and claudication.  Gastrointestinal: Negative for abdominal pain, blood in stool, constipation, diarrhea, heartburn, melena, nausea and vomiting.  Genitourinary: Negative for dysuria, flank pain, frequency, hematuria and urgency.  Musculoskeletal: Negative for back pain, joint pain and myalgias.  Skin: Negative for rash.  Neurological: Negative for dizziness, tingling, focal weakness, seizures, weakness and headaches.  Endo/Heme/Allergies: Does not bruise/bleed easily.  Psychiatric/Behavioral: Negative for depression and suicidal ideas. The patient does not have insomnia.     No Known Allergies  Past Medical History:  Diagnosis Date  . Anemia   . Cancer of sigmoid colon ([place]) 11/11/2017   Partial sigmoid colon resection, chemo + rad tx's.   . Diverticulosis   . Genetic testing 12/14/2017   Common Cancers panel (47 genes) @ [facility] - No pathogenic  mutations detected  . Headache   . Heart murmur   . Hyperlipidemia   .  Hypothyroidism   . Thyroid disease     Past Surgical History:  Procedure Laterality Date  . APPENDECTOMY     possible during hysterectomy  . BOWEL RESECTION N/A 11/11/2017   Procedure: SMALL BOWEL RESECTION;  Surgeon: Jules Husbands, MD;  Location: [facility] ORS;  Service: General;  Laterality: N/A;  . COLONOSCOPY WITH PROPOFOL N/A 11/10/2018   Procedure: COLONOSCOPY WITH PROPOFOL;  Surgeon: Jonathon Bellows, MD;  Location: [facility] ENDOSCOPY;  Service: Gastroenterology;  Laterality: N/A;  . FLEXIBLE SIGMOIDOSCOPY N/A 11/07/2017   Procedure: FLEXIBLE SIGMOIDOSCOPY;  Surgeon: Jonathon Bellows, MD;  Location: [facility] ENDOSCOPY;  Service: Gastroenterology;  Laterality: N/A;  . LAPAROSCOPIC SIGMOID COLECTOMY N/A 11/11/2017   Procedure: LAPAROSCOPIC SIGMOID COLECTOMY;  Surgeon: Jules Husbands, MD;  Location: [facility] ORS;  Service: General;  Laterality: N/A;  . MOUTH SURGERY    . PORTACATH PLACEMENT Right 11/29/2017   Procedure: INSERTION PORT-A-CATH;  Surgeon: Jules Husbands, MD;  Location: [place];  Service: General;  Laterality: Right;  May do local and IV sedation. Please have U/S  . TOTAL ABDOMINAL HYSTERECTOMY  05/20/2014   ooperectomy unilateral    Social History   Socioeconomic History  . Marital status: Married    Spouse name: Not on file  . Number of children: Not on file  . Years of education: Not on file  . Highest education level: Not on file  Occupational History  . Not on file  Tobacco Use  . Smoking status: Never Smoker  . Smokeless tobacco: Never Used  Substance and Sexual Activity  . Alcohol use: No    Alcohol/week: 0.0 standard drinks  . Drug use: No  . Sexual activity: Yes  Other Topics Concern  . Not on file  Social History Narrative  . Not on file   Social Determinants of Health   Financial Resource Strain:   . Difficulty of Paying Living Expenses: Not on file  Food Insecurity:   . Worried  About Charity fundraiser in the Last Year: Not on file  . Ran Out of Food in the Last Year: Not on file  Transportation Needs:   . Lack of Transportation (Medical): Not on file  . Lack of Transportation (Non-Medical): Not on file  Physical Activity:   . Days of Exercise per Week: Not on file  . Minutes of Exercise per Session: Not on file  Stress:   . Feeling of Stress : Not on file  Social Connections:   . Frequency of Communication with Friends and Family: Not on file  . Frequency of Social Gatherings with Friends and Family: Not on file  . Attends Religious Services: Not on file  . Active Member of Clubs or Organizations: Not on file  . Attends Archivist Meetings: Not on file  . Marital Status: Not on file  Intimate Partner Violence:   . Fear of Current or Ex-Partner: Not on file  . Emotionally Abused: Not on file  . Physically Abused: Not on file  . Sexually Abused: Not on file    Family History  Problem Relation Age of Onset  . Breast cancer Mother 62       currently 44  . Hypertension Mother   . Thyroid disease Brother   . Hernia Brother   . Thyroid cancer Maternal Grandmother 64       deceased 24  . Stomach cancer Paternal Grandfather        unconfirmed; deceased 17  . Stomach cancer Paternal Aunt 76  deceased 36  . Colon cancer Maternal Aunt 68       also lung cancer  . Stomach cancer Maternal Uncle 40     Current Outpatient Medications:  .  APPLE CIDER VINEGAR PO, Take 1 tablet by mouth daily., Disp: , Rfl:  .  Biotin 1000 MCG tablet, Take 1,000 mcg by mouth daily., Disp: , Rfl:  .  Cyanocobalamin (VITAMIN B 12 PO), Take 2 Doses by mouth daily. , Disp: , Rfl:  .  levothyroxine (SYNTHROID) 175 MCG tablet, Take 1 tablet (175 mcg total) by mouth daily before breakfast., Disp: 90 tablet, Rfl: 3 .  Multiple Vitamin tablet, Take 2 tablets by mouth daily. , Disp: , Rfl:  .  OMEGA-3 FATTY ACIDS PO, Take 2 capsules by mouth daily. , Disp: , Rfl:  .   VITAMIN D, CHOLECALCIFEROL, PO, Take 1 tablet by mouth daily., Disp: , Rfl:  No current facility-administered medications for this visit.  Facility-Administered Medications Ordered in Other Visits:  .  heparin lock flush 100 unit/mL, 500 Units, Intracatheter, PRN, Sindy Guadeloupe, MD .  heparin lock flush 100 unit/mL, 500 Units, Intravenous, Once, Sindy Guadeloupe, MD .  sodium chloride flush (NS) 0.9 % injection 10 mL, 10 mL, Intravenous, PRN, Sindy Guadeloupe, MD, 10 mL at 06/13/18 0844 .  sodium chloride flush (NS) 0.9 % injection 10 mL, 10 mL, Intravenous, Once, Sindy Guadeloupe, MD  CT Chest W Contrast  Result Date: 12/05/2019 CLINICAL DATA:  Stage III sigmoid colon cancer. EXAM: CT CHEST, ABDOMEN, AND PELVIS WITH CONTRAST TECHNIQUE: Multidetector CT imaging of the chest, abdomen and pelvis was performed following the standard protocol during bolus administration of intravenous contrast. CONTRAST:  188m OMNIPAQUE IOHEXOL 300 MG/ML  SOLN COMPARISON:  06/21/2019 FINDINGS: CT CHEST FINDINGS Cardiovascular: The heart size is normal. No substantial pericardial effusion. No thoracic aortic aneurysm. Mediastinum/Nodes: No mediastinal lymphadenopathy. There is no hilar lymphadenopathy. The esophagus has normal imaging features. There is no axillary lymphadenopathy. Lungs/Pleura: No suspicious nodule or mass. No focal airspace consolidation. No pleural effusion. Musculoskeletal: No worrisome lytic or sclerotic osseous abnormality. CT ABDOMEN PELVIS FINDINGS Hepatobiliary: No suspicious focal abnormality within the liver parenchyma. There is no evidence for gallstones, gallbladder wall thickening, or pericholecystic fluid. No intrahepatic or extrahepatic biliary dilation. Pancreas: No focal mass lesion. No dilatation of the main duct. No intraparenchymal cyst. No peripancreatic edema. Spleen: No splenomegaly. No focal mass lesion. Adrenals/Urinary Tract: No adrenal nodule or mass. Stable 9 mm low-density lesion  lower pole right kidney, likely a cyst. Left kidney unremarkable. No evidence for hydroureter. The urinary bladder appears normal for the degree of distention. Stomach/Bowel: Stomach is unremarkable. No gastric wall thickening. No evidence of outlet obstruction. Duodenum is normally positioned as is the ligament of Treitz. No small bowel wall thickening. No small bowel dilatation. The terminal ileum is normal. The appendix is not visualized, but there is no edema or inflammation in the region of the cecum. No gross colonic mass. No colonic wall thickening. Distal colonic anastomosis consistent with sigmoid colectomy. Vascular/Lymphatic: No abdominal aortic aneurysm. No abdominal aortic atherosclerotic calcification. There is no gastrohepatic or hepatoduodenal ligament lymphadenopathy. No intraperitoneal or retroperitoneal lymphadenopathy. No pelvic sidewall lymphadenopathy. Reproductive: The uterus is surgically absent. There is no adnexal mass. Other: No intraperitoneal free fluid. Musculoskeletal: No worrisome lytic or sclerotic osseous abnormality. IMPRESSION: 1. Stable exam. No new or progressive interval findings. Specifically, no evidence for local recurrence or metastatic disease in this patient status post sigmoid  colectomy. 2. Stable 9 mm low-density lesion lower pole right kidney, likely a cyst. Electronically Signed   By: Misty Stanley M.D.   On: 12/05/2019 12:26   CT ABDOMEN PELVIS W CONTRAST  Result Date: 12/05/2019 CLINICAL DATA:  Stage III sigmoid colon cancer. EXAM: CT CHEST, ABDOMEN, AND PELVIS WITH CONTRAST TECHNIQUE: Multidetector CT imaging of the chest, abdomen and pelvis was performed following the standard protocol during bolus administration of intravenous contrast. CONTRAST:  180m OMNIPAQUE IOHEXOL 300 MG/ML  SOLN COMPARISON:  06/21/2019 FINDINGS: CT CHEST FINDINGS Cardiovascular: The heart size is normal. No substantial pericardial effusion. No thoracic aortic aneurysm.  Mediastinum/Nodes: No mediastinal lymphadenopathy. There is no hilar lymphadenopathy. The esophagus has normal imaging features. There is no axillary lymphadenopathy. Lungs/Pleura: No suspicious nodule or mass. No focal airspace consolidation. No pleural effusion. Musculoskeletal: No worrisome lytic or sclerotic osseous abnormality. CT ABDOMEN PELVIS FINDINGS Hepatobiliary: No suspicious focal abnormality within the liver parenchyma. There is no evidence for gallstones, gallbladder wall thickening, or pericholecystic fluid. No intrahepatic or extrahepatic biliary dilation. Pancreas: No focal mass lesion. No dilatation of the main duct. No intraparenchymal cyst. No peripancreatic edema. Spleen: No splenomegaly. No focal mass lesion. Adrenals/Urinary Tract: No adrenal nodule or mass. Stable 9 mm low-density lesion lower pole right kidney, likely a cyst. Left kidney unremarkable. No evidence for hydroureter. The urinary bladder appears normal for the degree of distention. Stomach/Bowel: Stomach is unremarkable. No gastric wall thickening. No evidence of outlet obstruction. Duodenum is normally positioned as is the ligament of Treitz. No small bowel wall thickening. No small bowel dilatation. The terminal ileum is normal. The appendix is not visualized, but there is no edema or inflammation in the region of the cecum. No gross colonic mass. No colonic wall thickening. Distal colonic anastomosis consistent with sigmoid colectomy. Vascular/Lymphatic: No abdominal aortic aneurysm. No abdominal aortic atherosclerotic calcification. There is no gastrohepatic or hepatoduodenal ligament lymphadenopathy. No intraperitoneal or retroperitoneal lymphadenopathy. No pelvic sidewall lymphadenopathy. Reproductive: The uterus is surgically absent. There is no adnexal mass. Other: No intraperitoneal free fluid. Musculoskeletal: No worrisome lytic or sclerotic osseous abnormality. IMPRESSION: 1. Stable exam. No new or progressive  interval findings. Specifically, no evidence for local recurrence or metastatic disease in this patient status post sigmoid colectomy. 2. Stable 9 mm low-density lesion lower pole right kidney, likely a cyst. Electronically Signed   By: EMisty StanleyM.D.   On: 12/05/2019 12:26    No images are attached to the encounter.   CMP Latest Ref Rng & Units 12/26/2019  Glucose 70 - 99 mg/dL 128(H)  BUN 6 - 20 mg/dL 13  Creatinine 0.44 - 1.00 mg/dL 0.66  Sodium 135 - 145 mmol/L 137  Potassium 3.5 - 5.1 mmol/L 4.1  Chloride 98 - 111 mmol/L 103  CO2 22 - 32 mmol/L 24  Calcium 8.9 - 10.3 mg/dL 9.0  Total Protein 6.5 - 8.1 g/dL 7.3  Total Bilirubin 0.3 - 1.2 mg/dL 0.5  Alkaline Phos 38 - 126 U/L 101  AST 15 - 41 U/L 44(H)  ALT 0 - 44 U/L 79(H)   CBC Latest Ref Rng & Units 12/26/2019  WBC 4.0 - 10.5 K/uL 3.8(L)  Hemoglobin 12.0 - 15.0 g/dL 12.9  Hematocrit 36.0 - 46.0 % 40.5  Platelets 150 - 400 K/uL 177     Observation/objective: Appears in no acute distress on video visit.  Breathing nonlabored  Assessment and plan:Patient is a 49y.o. 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  Patient has completed all treatment for colon cancer including chemotherapy and radiation treatment back in June 2019.  CT scans currently show no evidence of recurrent disease.  She will be due for repeat colonoscopy after 3 years.   Follow-up instructions: Check CBC with differential, CMP and CEA in 3 months in 6 months.  Repeat CT abdomen and pelvis with contrast in 6 months and I will see her after CT scans  I discussed the assessment and treatment plan with the patient. The patient was provided an opportunity to ask questions  and all were answered. The patient agreed with the plan and demonstrated an understanding of the instructions.   The patient was advised to call back or seek an in-person evaluation if the symptoms worsen or if the condition fails to improve as anticipated.   Visit Diagnosis: 1. Encounter for follow-up surveillance of colon cancer     Dr. Randa Evens, MD, MPH [facility] [phone] 12/29/2019 4:56 PM

## 2020-03-26 ENCOUNTER — Other Ambulatory Visit: Payer: Self-pay

## 2020-03-26 ENCOUNTER — Inpatient Hospital Stay: Payer: Managed Care, Other (non HMO) | Attending: Oncology

## 2020-03-26 DIAGNOSIS — Z85038 Personal history of other malignant neoplasm of large intestine: Secondary | ICD-10-CM | POA: Diagnosis not present

## 2020-03-26 DIAGNOSIS — Z08 Encounter for follow-up examination after completed treatment for malignant neoplasm: Secondary | ICD-10-CM

## 2020-03-26 DIAGNOSIS — Z9221 Personal history of antineoplastic chemotherapy: Secondary | ICD-10-CM | POA: Diagnosis not present

## 2020-03-26 DIAGNOSIS — Z923 Personal history of irradiation: Secondary | ICD-10-CM | POA: Diagnosis not present

## 2020-03-26 LAB — CBC WITH DIFFERENTIAL/PLATELET
Abs Immature Granulocytes: 0.01 10*3/uL (ref 0.00–0.07)
Basophils Absolute: 0 10*3/uL (ref 0.0–0.1)
Basophils Relative: 0 %
Eosinophils Absolute: 0.1 10*3/uL (ref 0.0–0.5)
Eosinophils Relative: 2 %
HCT: 41.5 % (ref 36.0–46.0)
Hemoglobin: 13.6 g/dL (ref 12.0–15.0)
Immature Granulocytes: 0 %
Lymphocytes Relative: 34 %
Lymphs Abs: 1.6 10*3/uL (ref 0.7–4.0)
MCH: 29.3 pg (ref 26.0–34.0)
MCHC: 32.8 g/dL (ref 30.0–36.0)
MCV: 89.4 fL (ref 80.0–100.0)
Monocytes Absolute: 0.3 10*3/uL (ref 0.1–1.0)
Monocytes Relative: 7 %
Neutro Abs: 2.6 10*3/uL (ref 1.7–7.7)
Neutrophils Relative %: 57 %
Platelets: 181 10*3/uL (ref 150–400)
RBC: 4.64 MIL/uL (ref 3.87–5.11)
RDW: 13.6 % (ref 11.5–15.5)
WBC: 4.6 10*3/uL (ref 4.0–10.5)
nRBC: 0 % (ref 0.0–0.2)

## 2020-03-26 LAB — COMPREHENSIVE METABOLIC PANEL
ALT: 77 U/L — ABNORMAL HIGH (ref 0–44)
AST: 32 U/L (ref 15–41)
Albumin: 4.1 g/dL (ref 3.5–5.0)
Alkaline Phosphatase: 93 U/L (ref 38–126)
Anion gap: 9 (ref 5–15)
BUN: 10 mg/dL (ref 6–20)
CO2: 29 mmol/L (ref 22–32)
Calcium: 9.5 mg/dL (ref 8.9–10.3)
Chloride: 104 mmol/L (ref 98–111)
Creatinine, Ser: 0.69 mg/dL (ref 0.44–1.00)
GFR calc Af Amer: >60 mL/min (ref >60)
GFR calc non Af Amer: >60 mL/min (ref >60)
Glucose, Bld: 96 mg/dL (ref 70–99)
Potassium: 4.4 mmol/L (ref 3.5–5.1)
Sodium: 142 mmol/L (ref 135–145)
Total Bilirubin: 0.6 mg/dL (ref 0.3–1.2)
Total Protein: 7.6 g/dL (ref 6.5–8.1)

## 2020-03-27 LAB — CEA: CEA: 0.6 ng/mL (ref 0.0–4.7)

## 2020-06-20 ENCOUNTER — Other Ambulatory Visit: Payer: Self-pay | Admitting: Pharmacist

## 2020-06-24 ENCOUNTER — Ambulatory Visit: Payer: Managed Care, Other (non HMO)

## 2020-06-24 ENCOUNTER — Other Ambulatory Visit: Payer: Self-pay

## 2020-06-24 ENCOUNTER — Inpatient Hospital Stay: Payer: Managed Care, Other (non HMO) | Attending: Oncology

## 2020-06-24 DIAGNOSIS — Z9221 Personal history of antineoplastic chemotherapy: Secondary | ICD-10-CM | POA: Diagnosis not present

## 2020-06-24 DIAGNOSIS — Z85038 Personal history of other malignant neoplasm of large intestine: Secondary | ICD-10-CM | POA: Diagnosis present

## 2020-06-24 DIAGNOSIS — Z923 Personal history of irradiation: Secondary | ICD-10-CM | POA: Insufficient documentation

## 2020-06-24 LAB — COMPREHENSIVE METABOLIC PANEL
ALT: 47 U/L — ABNORMAL HIGH (ref 0–44)
AST: 32 U/L (ref 15–41)
Albumin: 4.2 g/dL (ref 3.5–5.0)
Alkaline Phosphatase: 92 U/L (ref 38–126)
Anion gap: 11 (ref 5–15)
BUN: 13 mg/dL (ref 6–20)
CO2: 26 mmol/L (ref 22–32)
Calcium: 9.2 mg/dL (ref 8.9–10.3)
Chloride: 104 mmol/L (ref 98–111)
Creatinine, Ser: 0.6 mg/dL (ref 0.44–1.00)
GFR calc Af Amer: >60 mL/min (ref >60)
GFR calc non Af Amer: >60 mL/min (ref >60)
Glucose, Bld: 96 mg/dL (ref 70–99)
Potassium: 3.8 mmol/L (ref 3.5–5.1)
Sodium: 141 mmol/L (ref 135–145)
Total Bilirubin: 0.6 mg/dL (ref 0.3–1.2)
Total Protein: 7.5 g/dL (ref 6.5–8.1)

## 2020-06-24 LAB — CBC WITH DIFFERENTIAL/PLATELET
Abs Immature Granulocytes: 0.03 10*3/uL (ref 0.00–0.07)
Basophils Absolute: 0 10*3/uL (ref 0.0–0.1)
Basophils Relative: 0 %
Eosinophils Absolute: 0.1 10*3/uL (ref 0.0–0.5)
Eosinophils Relative: 2 %
HCT: 40.3 % (ref 36.0–46.0)
Hemoglobin: 13.5 g/dL (ref 12.0–15.0)
Immature Granulocytes: 1 %
Lymphocytes Relative: 36 %
Lymphs Abs: 1.3 10*3/uL (ref 0.7–4.0)
MCH: 29.5 pg (ref 26.0–34.0)
MCHC: 33.5 g/dL (ref 30.0–36.0)
MCV: 88.2 fL (ref 80.0–100.0)
Monocytes Absolute: 0.3 10*3/uL (ref 0.1–1.0)
Monocytes Relative: 8 %
Neutro Abs: 1.9 10*3/uL (ref 1.7–7.7)
Neutrophils Relative %: 53 %
Platelets: 181 10*3/uL (ref 150–400)
RBC: 4.57 MIL/uL (ref 3.87–5.11)
RDW: 13.9 % (ref 11.5–15.5)
WBC: 3.6 10*3/uL — ABNORMAL LOW (ref 4.0–10.5)
nRBC: 0 % (ref 0.0–0.2)

## 2020-06-25 LAB — CEA: CEA: 0.8 ng/mL (ref 0.0–4.7)

## 2020-06-26 ENCOUNTER — Other Ambulatory Visit: Payer: Self-pay

## 2020-06-26 ENCOUNTER — Encounter: Payer: Self-pay | Admitting: Oncology

## 2020-06-26 ENCOUNTER — Inpatient Hospital Stay (HOSPITAL_BASED_OUTPATIENT_CLINIC_OR_DEPARTMENT_OTHER): Payer: Managed Care, Other (non HMO) | Admitting: Oncology

## 2020-06-26 VITALS — BP 185/92 | HR 81 | Temp 97.2°F | Resp 18 | Wt 209.4 lb

## 2020-06-26 DIAGNOSIS — Z85038 Personal history of other malignant neoplasm of large intestine: Secondary | ICD-10-CM | POA: Diagnosis not present

## 2020-06-26 DIAGNOSIS — Z08 Encounter for follow-up examination after completed treatment for malignant neoplasm: Secondary | ICD-10-CM | POA: Diagnosis not present

## 2020-06-26 NOTE — Progress Notes (Signed)
Pt in for follow up, denies any concerns today. 

## 2020-06-27 NOTE — Progress Notes (Signed)
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 Vicente Males from GI for recurrent episodes of diverticulitis in August 2018.  Flexible sigmoidoscopy showed an obstructing mass in the rectosigmoid.Patient underwent laparoscopic low anterior resection along with small bowel resection and laparoscopic takedown of the splenic flexure on 11/11/2017. 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 Tumor size was 3 cm, moderately differentiated. All margins were negative for invasive carcinoma. Perineural invasion and lymphovascular invasion was present. 1 out of 17 lymph nodes was positive for malignancy. Pathologic stage was pT4b pN1a. MSI stable  Patient completed 10 cycles of FOLFOX chemotherapy in June 2019. oxaliplatin stopped after 9 cycles. She did not wish to complete 12 cycles due to neuropathy and fatigue. Patient also had RT given T4 disease   Interval history-patient reports doing well overall.  Appetite and weight have remained stable.  Bowel movements are regular.  Denies any blood in her stool or dark melanotic stool.  She has mild neuropathy in her fingers which is overall stable  to improving  ECOG PS- 0 Pain scale- 0   Review of systems- Review of Systems  Constitutional: Negative for chills, fever, malaise/fatigue and weight loss.  HENT: Negative for congestion, ear discharge and nosebleeds.   Eyes: Negative for blurred vision.  Respiratory: Negative for cough, hemoptysis, sputum production, shortness of breath and wheezing.   Cardiovascular: Negative for chest pain, palpitations, orthopnea and claudication.  Gastrointestinal: Negative for abdominal pain, blood in stool, constipation, diarrhea, heartburn, melena, nausea and vomiting.  Genitourinary: Negative for dysuria, flank pain, frequency, hematuria and urgency.  Musculoskeletal: Negative for back pain, joint pain and myalgias.  Skin: Negative for rash.  Neurological: Positive for sensory change (Peripheral neuropathy). Negative for dizziness, tingling, focal weakness, seizures, weakness and headaches.  Endo/Heme/Allergies: Does not bruise/bleed easily.  Psychiatric/Behavioral: Negative for depression and suicidal ideas. The patient does not have insomnia.       No Known Allergies   Past Medical History:  Diagnosis Date  . Anemia   . Cancer of sigmoid colon ([place]) 11/11/2017   Partial sigmoid colon resection, chemo + rad tx's.   . Diverticulosis   . Genetic testing 12/14/2017   Common Cancers panel (47 genes) @ [facility] - No pathogenic mutations detected  . Headache   . Heart murmur   . Hyperlipidemia   . Hypothyroidism   . Thyroid disease      Past Surgical History:  Procedure Laterality Date  . APPENDECTOMY     possible during hysterectomy  . BOWEL RESECTION N/A 11/11/2017   Procedure: SMALL BOWEL RESECTION;  Surgeon: Jules Husbands, MD;  Location: [facility] ORS;  Service: General;  Laterality: N/A;  . COLONOSCOPY WITH PROPOFOL N/A 11/10/2018   Procedure: COLONOSCOPY WITH PROPOFOL;  Surgeon: Jonathon Bellows, MD;  Location: [facility] ENDOSCOPY;  Service: Gastroenterology;  Laterality: N/A;  . FLEXIBLE  SIGMOIDOSCOPY N/A 11/07/2017   Procedure: FLEXIBLE SIGMOIDOSCOPY;  Surgeon: Jonathon Bellows, MD;  Location: [facility] ENDOSCOPY;  Service: Gastroenterology;  Laterality: N/A;  . LAPAROSCOPIC SIGMOID COLECTOMY N/A 11/11/2017   Procedure: LAPAROSCOPIC SIGMOID COLECTOMY;  Surgeon: Jules Husbands, MD;  Location: [facility] ORS;  Service: General;  Laterality: N/A;  . MOUTH SURGERY    . PORTACATH PLACEMENT Right 11/29/2017   Procedure: INSERTION PORT-A-CATH;  Surgeon: Jules Husbands, MD;  Location: [place];  Service: General;  Laterality: Right;  May do local and IV sedation. Please have U/S  . TOTAL ABDOMINAL HYSTERECTOMY  05/20/2014   ooperectomy unilateral    Social History   Socioeconomic History  . Marital status: Married    Spouse name: Not on file  . Number of children: Not on file  . Years of education: Not on file  . Highest education level: Not on file  Occupational History  . Not on file  Tobacco Use  . Smoking status: Never Smoker  . Smokeless tobacco: Never Used  Vaping Use  . Vaping Use: Never used  Substance and Sexual Activity  . Alcohol use: No    Alcohol/week: 0.0 standard drinks  . Drug use: No  . Sexual activity: Yes  Other Topics Concern  . Not on file  Social History Narrative  . Not on file   Social Determinants of Health   Financial Resource Strain:   . Difficulty of Paying Living Expenses:   Food Insecurity:   . Worried About Charity fundraiser in the Last Year:   . Arboriculturist in the Last Year:   Transportation Needs:   . Film/video editor (Medical):   Marland Kitchen Lack of Transportation (Non-Medical):   Physical Activity:   . Days of Exercise per Week:   . Minutes of Exercise per Session:   Stress:   . Feeling of Stress :   Social Connections:   . Frequency of Communication with Friends and Family:   . Frequency of Social Gatherings with Friends and Family:   . Attends Religious Services:   . Active Member of Clubs or Organizations:   .  Attends Archivist Meetings:   Marland Kitchen Marital Status:   Intimate Partner Violence:   . Fear of Current or Ex-Partner:   . Emotionally Abused:   Marland Kitchen Physically Abused:   . Sexually Abused:     Family History  Problem Relation Age of Onset  . Breast cancer Mother 40       currently 9  . Hypertension Mother   . Thyroid disease Brother   . Hernia Brother   . Thyroid cancer Maternal Grandmother 68       deceased 96  . Stomach cancer Paternal Grandfather        unconfirmed; deceased 9  . Stomach cancer Paternal Aunt 79       deceased 60  . Colon cancer Maternal Aunt 68       also lung cancer  . Stomach cancer Maternal Uncle 48     Current Outpatient Medications:  .  APPLE CIDER VINEGAR PO, Take 1 tablet by mouth daily., Disp: , Rfl:  .  aspirin EC 81 MG tablet, Take 81 mg by mouth daily. Swallow whole., Disp: , Rfl:  .  Biotin 1000 MCG tablet, Take 1,000 mcg by mouth daily., Disp: , Rfl:  .  CINNAMON PO, Take by mouth., Disp: , Rfl:  .  Cyanocobalamin (VITAMIN B 12  PO), Take 2 Doses by mouth daily. , Disp: , Rfl:  .  Garlic 5366 MG CAPS, Take by mouth., Disp: , Rfl:  .  levothyroxine (SYNTHROID) 175 MCG tablet, Take 1 tablet (175 mcg total) by mouth daily before breakfast., Disp: 90 tablet, Rfl: 3 .  Multiple Vitamin tablet, Take 2 tablets by mouth daily. , Disp: , Rfl:  .  OMEGA-3 FATTY ACIDS PO, Take 2 capsules by mouth daily. , Disp: , Rfl:  No current facility-administered medications for this visit.  Facility-Administered Medications Ordered in Other Visits:  .  heparin lock flush 100 unit/mL, 500 Units, Intracatheter, PRN, Sindy Guadeloupe, MD .  sodium chloride flush (NS) 0.9 % injection 10 mL, 10 mL, Intravenous, PRN, Sindy Guadeloupe, MD, 10 mL at 06/13/18 0844  Physical exam:  Vitals:   06/26/20 1440  BP: (!) 185/92  Pulse: 81  Resp: 18  Temp: (!) 97.2 F (36.2 C)  TempSrc: Tympanic  SpO2: 99%  Weight: 209 lb 6.4 oz (95 kg)   Physical Exam Constitutional:       General: She is not in acute distress. Cardiovascular:     Rate and Rhythm: Normal rate and regular rhythm.     Heart sounds: Normal heart sounds.  Pulmonary:     Effort: Pulmonary effort is normal.     Breath sounds: Normal breath sounds.  Abdominal:     General: Bowel sounds are normal. There is no distension.     Palpations: Abdomen is soft.     Tenderness: There is no abdominal tenderness.  Skin:    General: Skin is warm and dry.  Neurological:     Mental Status: She is alert and oriented to person, place, and time.      CMP Latest Ref Rng & Units 06/24/2020  Glucose 70 - 99 mg/dL 96  BUN 6 - 20 mg/dL 13  Creatinine 0.44 - 1.00 mg/dL 0.60  Sodium 135 - 145 mmol/L 141  Potassium 3.5 - 5.1 mmol/L 3.8  Chloride 98 - 111 mmol/L 104  CO2 22 - 32 mmol/L 26  Calcium 8.9 - 10.3 mg/dL 9.2  Total Protein 6.5 - 8.1 g/dL 7.5  Total Bilirubin 0.3 - 1.2 mg/dL 0.6  Alkaline Phos 38 - 126 U/L 92  AST 15 - 41 U/L 32  ALT 0 - 44 U/L 47(H)   CBC Latest Ref Rng & Units 06/24/2020  WBC 4.0 - 10.5 K/uL 3.6(L)  Hemoglobin 12.0 - 15.0 g/dL 13.5  Hematocrit 36 - 46 % 40.3  Platelets 150 - 400 K/uL 181     Assessment and plan- Patient is a 49 y.o. 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 this is a routine surveillance visit for colon cancer  Clinically patient is doing well with no concerning signs and symptoms of recurrence.  Her last CT scan was in December 2020.  Patient did not get a CT scan in 6 months due to significant co-pay.  She is agreeing to get it done in early January of next year and I will schedule CT chest abdomen and pelvis with contrast at that time  I will see her back in 6 months with CBC with differential CMP and CEA.  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  Overall mild and stable continue to monitor  Visit Diagnosis 1. Encounter for follow-up surveillance of colon cancer      Dr. Randa Evens, MD, MPH [facility] at [facility] [phone] 06/27/2020 12:28 PM

## 2020-11-10 ENCOUNTER — Other Ambulatory Visit: Payer: Self-pay

## 2020-11-10 ENCOUNTER — Encounter: Payer: Self-pay | Admitting: Family Medicine

## 2020-11-10 ENCOUNTER — Ambulatory Visit: Payer: Managed Care, Other (non HMO) | Admitting: Family Medicine

## 2020-11-10 VITALS — BP 146/85 | HR 88 | Temp 97.7°F | Resp 16 | Ht 67.0 in | Wt 212.6 lb

## 2020-11-10 DIAGNOSIS — E034 Atrophy of thyroid (acquired): Secondary | ICD-10-CM | POA: Diagnosis not present

## 2020-11-10 DIAGNOSIS — E669 Obesity, unspecified: Secondary | ICD-10-CM

## 2020-11-10 DIAGNOSIS — E039 Hypothyroidism, unspecified: Secondary | ICD-10-CM

## 2020-11-10 LAB — T4, FREE: Free T4: 1.6 ng/dL (ref 0.8–1.8)

## 2020-11-10 LAB — TSH: TSH: 1.31 mIU/L

## 2020-11-10 NOTE — Progress Notes (Signed)
Subjective:    Patient ID: Regina Patel, female    DOB: 1971-06-04, 49 y.o.   MRN: [id number]  Regina Patel is a 49 y.o. female presenting on 11/10/2020 for Hypothyroidism   HPI   Hypothyroidism Last lab done 10/2019, TSH 0.96, Free T4 1.7 no new concerns on current med, she continues levothyroxine 144mcg daily She is due for refills, has 1 week left, needed lab before can get refills  Elevated BP without HTN Obesity BMI >33 She monitors BP at home with wrist cuff, readings show avg 120-140 / 80s usually BP can be slightly higher at doctors office. Had been better at last appointment Not on medication She is improving lifestyle diet exercise regimen goal wt loss  Health Maintenance: UTD Vaccines, COVID Declines flu shot today  Depression screen [facility] 2/9 11/10/2020 10/26/2019 10/20/2018  Decreased Interest 0 0 0  Down, Depressed, Hopeless 0 0 0  PHQ - 2 Score 0 0 0    Social History   Tobacco Use  . Smoking status: Never Smoker  . Smokeless tobacco: Never Used  Vaping Use  . Vaping Use: Never used  Substance Use Topics  . Alcohol use: No    Alcohol/week: 0.0 standard drinks  . Drug use: No    Review of Systems Per HPI unless specifically indicated above     Objective:    BP (!) 146/85 (BP Location: Left Arm, Cuff Size: Normal)   Pulse 88   Temp 97.7 F (36.5 C) (Temporal)   Resp 16   Ht 5\' 7"  (1.702 m)   Wt 212 lb 9.6 oz (96.4 kg)   SpO2 100%   BMI 33.30 kg/m   Wt Readings from Last 3 Encounters:  11/10/20 212 lb 9.6 oz (96.4 kg)  06/26/20 209 lb 6.4 oz (95 kg)  10/26/19 212 lb (96.2 kg)    Physical Exam Vitals and nursing note reviewed.  Constitutional:      General: She is not in acute distress.    Appearance: She is well-developed. She is obese. She is not diaphoretic.     Comments: Well-appearing, comfortable, cooperative  HENT:     Head: Normocephalic and atraumatic.  Eyes:     General:        Right eye: No discharge.        Left eye: No  discharge.     Conjunctiva/sclera: Conjunctivae normal.  Neck:     Thyroid: No thyromegaly.  Cardiovascular:     Rate and Rhythm: Normal rate and regular rhythm.     Heart sounds: Normal heart sounds. No murmur heard.   Pulmonary:     Effort: Pulmonary effort is normal. No respiratory distress.     Breath sounds: Normal breath sounds. No wheezing or rales.  Musculoskeletal:        General: Normal range of motion.     Cervical back: Normal range of motion and neck supple.  Lymphadenopathy:     Cervical: No cervical adenopathy.  Skin:    General: Skin is warm and dry.     Findings: No erythema or rash.  Neurological:     Mental Status: She is alert and oriented to person, place, and time.  Psychiatric:        Behavior: Behavior normal.     Comments: Well groomed, good eye contact, normal speech and thoughts       Results for orders placed or performed in visit on 06/24/20  CEA  Result Value Ref Range   CEA 0.8  0.0 - 4.7 ng/mL  Comprehensive metabolic panel  Result Value Ref Range   Sodium 141 135 - 145 mmol/L   Potassium 3.8 3.5 - 5.1 mmol/L   Chloride 104 98 - 111 mmol/L   CO2 26 22 - 32 mmol/L   Glucose, Bld 96 70 - 99 mg/dL   BUN 13 6 - 20 mg/dL   Creatinine, Ser 0.60 0.44 - 1.00 mg/dL   Calcium 9.2 8.9 - 10.3 mg/dL   Total Protein 7.5 6.5 - 8.1 g/dL   Albumin 4.2 3.5 - 5.0 g/dL   AST 32 15 - 41 U/L   ALT 47 (H) 0 - 44 U/L   Alkaline Phosphatase 92 38 - 126 U/L   Total Bilirubin 0.6 0.3 - 1.2 mg/dL   GFR calc non Af Amer >60 >60 mL/min   GFR calc Af Amer >60 >60 mL/min   Anion gap 11 5 - 15  CBC with Differential  Result Value Ref Range   WBC 3.6 (L) 4.0 - 10.5 K/uL   RBC 4.57 3.87 - 5.11 MIL/uL   Hemoglobin 13.5 12.0 - 15.0 g/dL   HCT 40.3 36 - 46 %   MCV 88.2 80.0 - 100.0 fL   MCH 29.5 26.0 - 34.0 pg   MCHC 33.5 30.0 - 36.0 g/dL   RDW 13.9 11.5 - 15.5 %   Platelets 181 150 - 400 K/uL   nRBC 0.0 0.0 - 0.2 %   Neutrophils Relative % 53 %   Neutro Abs  1.9 1.7 - 7.7 K/uL   Lymphocytes Relative 36 %   Lymphs Abs 1.3 0.7 - 4.0 K/uL   Monocytes Relative 8 %   Monocytes Absolute 0.3 0.1 - 1.0 K/uL   Eosinophils Relative 2 %   Eosinophils Absolute 0.1 0.0 - 0.5 K/uL   Basophils Relative 0 %   Basophils Absolute 0.0 0.0 - 0.1 K/uL   Immature Granulocytes 1 %   Abs Immature Granulocytes 0.03 0.00 - 0.07 K/uL      Assessment & Plan:   Problem List Items Addressed This Visit    Obesity (BMI 30.0-34.9)   Hypothyroidism - Primary   Relevant Orders   TSH   T4, free      #Hypothyroidism Check labs today TSH + Free T4, drawn in office, send out to [facility] Review results and determine if refill same dose Levothyroxine 170mcg daily or adjust  #Obesity BMI Encourage lifestyle diet exercise wt loss goal  #Elevated BP without HTN Goal to monitor Bp at home, aim < 135/85 If persistent elevated readings, some concern now some higher reading 140s in office may consider treatment sooner. Bring her cuff in to office to calibrate  No orders of the defined types were placed in this encounter.     Follow up plan: Return in about 1 year (around 11/10/2021) for Follow-up 1 year fasting lab only then 1 week later Annual Physical.   [name], DO [facility] 11/10/2020, 12:00 PM

## 2020-11-10 NOTE — Patient Instructions (Addendum)
Thank you for coming to the office today.  Stay tuned for results on Thyroid and will order refill levothyroxine 172mcg if we are stable, if there is a change that is needed, will let you know.  DUE for FASTING BLOOD WORK (no food or drink after midnight before the lab appointment, only water or coffee without cream/sugar on the morning of)  SCHEDULE "Lab Only" visit in the morning at the clinic for lab draw in 1 YEAR  - Make sure Lab Only appointment is at about 1 week before your next appointment, so that results will be available  For Lab Results, once available within 2-3 days of blood draw, you can can log in to MyChart online to view your results and a brief explanation. Also, we can discuss results at next follow-up visit.    Please schedule a Follow-up Appointment to: Return in about 1 year (around 11/10/2021) for Follow-up 1 year fasting lab only then 1 week later Annual Physical.  If you have any other questions or concerns, please feel free to call the office or send a message through [place]. You may also schedule an earlier appointment if necessary.  Additionally, you may be receiving a survey about your experience at our office within a few days to 1 week by e-mail or mail. We value your feedback.  [name], DO [place]

## 2020-11-11 MED ORDER — LEVOTHYROXINE SODIUM 175 MCG PO TABS: 175.0000 ug | ORAL_TABLET | Freq: Every day | ORAL | 3 refills | Status: DC

## 2020-11-11 NOTE — Addendum Note (Signed)
Addended by: Olin Hauser on: 11/11/2020 12:22 PM   Modules accepted: Orders

## 2020-12-17 ENCOUNTER — Telehealth: Payer: Self-pay | Admitting: *Deleted

## 2020-12-17 NOTE — Telephone Encounter (Signed)
Called pt to let her know that the ct scna ha not been approved as of today and it is in Wellsite geologist review at this time and we do not know how long it is going to take. The appt is for 1/3 and due to the upcoming holiday we do not feel it will get approved by then. Asked if we could cancel the lab, scan appt and MD appt and reschedule all of it when scan is approved and she is fine with it. All appts cancelled.

## 2020-12-22 ENCOUNTER — Inpatient Hospital Stay: Payer: Managed Care, Other (non HMO)

## 2020-12-22 ENCOUNTER — Ambulatory Visit: Payer: Managed Care, Other (non HMO)

## 2020-12-23 ENCOUNTER — Other Ambulatory Visit: Payer: Self-pay | Admitting: *Deleted

## 2020-12-23 DIAGNOSIS — C187 Malignant neoplasm of sigmoid colon: Secondary | ICD-10-CM

## 2020-12-26 ENCOUNTER — Ambulatory Visit: Payer: Managed Care, Other (non HMO) | Admitting: Oncology

## 2020-12-30 ENCOUNTER — Ambulatory Visit
Admission: RE | Admit: 2020-12-30 | Discharge: 2020-12-30 | Disposition: A | Discharge status: Home / Self Care | Payer: Managed Care, Other (non HMO) | Source: Ambulatory Visit | Attending: Oncology | Admitting: Oncology

## 2020-12-30 ENCOUNTER — Other Ambulatory Visit: Payer: Self-pay

## 2020-12-30 DIAGNOSIS — C187 Malignant neoplasm of sigmoid colon: Secondary | ICD-10-CM | POA: Diagnosis present

## 2020-12-30 LAB — POCT I-STAT CREATININE: Creatinine, Ser: 0.6 mg/dL (ref 0.44–1.00)

## 2020-12-30 MED ORDER — IOHEXOL 300 MG/ML  SOLN
100.0000 mL | Freq: Once | INTRAMUSCULAR | Status: AC | PRN
  Administered 2020-12-30: 100 mL via INTRAVENOUS

## 2021-01-01 ENCOUNTER — Encounter: Payer: Self-pay | Admitting: Oncology

## 2021-01-01 ENCOUNTER — Inpatient Hospital Stay: Payer: Managed Care, Other (non HMO) | Attending: Oncology

## 2021-01-01 ENCOUNTER — Inpatient Hospital Stay (HOSPITAL_BASED_OUTPATIENT_CLINIC_OR_DEPARTMENT_OTHER): Payer: Managed Care, Other (non HMO) | Admitting: Oncology

## 2021-01-01 VITALS — BP 186/93 | HR 89 | Temp 98.2°F | Resp 20 | Wt 215.9 lb

## 2021-01-01 DIAGNOSIS — Z85038 Personal history of other malignant neoplasm of large intestine: Secondary | ICD-10-CM

## 2021-01-01 DIAGNOSIS — Z923 Personal history of irradiation: Secondary | ICD-10-CM | POA: Diagnosis not present

## 2021-01-01 DIAGNOSIS — Z08 Encounter for follow-up examination after completed treatment for malignant neoplasm: Secondary | ICD-10-CM | POA: Diagnosis not present

## 2021-01-01 DIAGNOSIS — Z9221 Personal history of antineoplastic chemotherapy: Secondary | ICD-10-CM | POA: Insufficient documentation

## 2021-01-01 LAB — COMPREHENSIVE METABOLIC PANEL
ALT: 34 U/L (ref 0–44)
AST: 26 U/L (ref 15–41)
Albumin: 4.1 g/dL (ref 3.5–5.0)
Alkaline Phosphatase: 75 U/L (ref 38–126)
Anion gap: 8 (ref 5–15)
BUN: 10 mg/dL (ref 6–20)
CO2: 27 mmol/L (ref 22–32)
Calcium: 9.1 mg/dL (ref 8.9–10.3)
Chloride: 103 mmol/L (ref 98–111)
Creatinine, Ser: 0.58 mg/dL (ref 0.44–1.00)
GFR, Estimated: >60 mL/min (ref >60)
Glucose, Bld: 114 mg/dL — ABNORMAL HIGH (ref 70–99)
Potassium: 4 mmol/L (ref 3.5–5.1)
Sodium: 138 mmol/L (ref 135–145)
Total Bilirubin: 0.2 mg/dL — ABNORMAL LOW (ref 0.3–1.2)
Total Protein: 7.1 g/dL (ref 6.5–8.1)

## 2021-01-01 LAB — CBC WITH DIFFERENTIAL/PLATELET
Abs Immature Granulocytes: 0.01 10*3/uL (ref 0.00–0.07)
Basophils Absolute: 0 10*3/uL (ref 0.0–0.1)
Basophils Relative: 1 %
Eosinophils Absolute: 0.1 10*3/uL (ref 0.0–0.5)
Eosinophils Relative: 1 %
HCT: 38.9 % (ref 36.0–46.0)
Hemoglobin: 13.2 g/dL (ref 12.0–15.0)
Immature Granulocytes: 0 %
Lymphocytes Relative: 38 %
Lymphs Abs: 1.5 10*3/uL (ref 0.7–4.0)
MCH: 29.9 pg (ref 26.0–34.0)
MCHC: 33.9 g/dL (ref 30.0–36.0)
MCV: 88.2 fL (ref 80.0–100.0)
Monocytes Absolute: 0.4 10*3/uL (ref 0.1–1.0)
Monocytes Relative: 9 %
Neutro Abs: 2 10*3/uL (ref 1.7–7.7)
Neutrophils Relative %: 51 %
Platelets: 175 10*3/uL (ref 150–400)
RBC: 4.41 MIL/uL (ref 3.87–5.11)
RDW: 13.5 % (ref 11.5–15.5)
WBC: 4 10*3/uL (ref 4.0–10.5)
nRBC: 0 % (ref 0.0–0.2)

## 2021-01-01 NOTE — Progress Notes (Signed)
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 Vicente Males from GI for recurrent episodes of diverticulitis in August 2018.  Flexible sigmoidoscopy showed an obstructing mass in the rectosigmoid.Patient underwent laparoscopic low anterior resection along with small bowel resection and laparoscopic takedown of the splenic flexure on 11/11/2017. 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 Tumor size was 3 cm, moderately differentiated. All margins were negative for invasive carcinoma. Perineural invasion and lymphovascular invasion was present. 1 out of 17 lymph nodes was positive for malignancy. Pathologic stage was pT4b pN1a. MSI stable  Patient completed 10 cycles of FOLFOX chemotherapy in June 2019. oxaliplatin stopped after 9 cycles. She did not wish to complete 12 cycles due to neuropathy and fatigue. Patient also had RT given T4 disease  Interval history-she is doing well overall and denies any complaints at this time.  Appetite and weight is stable.  Denies any abdominal pain or rectal bleeding  ECOG PS- 0 Pain scale- 0   Review of systems- Review of Systems  Constitutional: Negative  for chills, fever, malaise/fatigue and weight loss.  HENT: Negative for congestion, ear discharge and nosebleeds.   Eyes: Negative for blurred vision.  Respiratory: Negative for cough, hemoptysis, sputum production, shortness of breath and wheezing.   Cardiovascular: Negative for chest pain, palpitations, orthopnea and claudication.  Gastrointestinal: Negative for abdominal pain, blood in stool, constipation, diarrhea, heartburn, melena, nausea and vomiting.  Genitourinary: Negative for dysuria, flank pain, frequency, hematuria and urgency.  Musculoskeletal: Negative for back pain, joint pain and myalgias.  Skin: Negative for rash.  Neurological: Negative for dizziness, tingling, focal weakness, seizures, weakness and headaches.  Endo/Heme/Allergies: Does not bruise/bleed easily.  Psychiatric/Behavioral: Negative for depression and suicidal ideas. The patient does not have insomnia.       No Known Allergies   Past Medical History:  Diagnosis Date  . Anemia   . Cancer of sigmoid colon ([place]) 11/11/2017   Partial sigmoid colon resection, chemo + rad tx's.   . Diverticulosis   . Genetic testing 12/14/2017   Common Cancers panel (47 genes) @ [facility] - No pathogenic mutations detected  . Headache   . Heart murmur   . Hyperlipidemia   . Hypothyroidism   . Thyroid disease      Past Surgical History:  Procedure Laterality Date  . APPENDECTOMY     possible during hysterectomy  . BOWEL RESECTION N/A 11/11/2017   Procedure: SMALL BOWEL RESECTION;  Surgeon: Jules Husbands, MD;  Location: [facility] ORS;  Service: General;  Laterality: N/A;  . COLONOSCOPY WITH PROPOFOL N/A 11/10/2018   Procedure: COLONOSCOPY WITH PROPOFOL;  Surgeon: Jonathon Bellows, MD;  Location: [facility] ENDOSCOPY;  Service: Gastroenterology;  Laterality: N/A;  . FLEXIBLE SIGMOIDOSCOPY N/A 11/07/2017   Procedure: FLEXIBLE SIGMOIDOSCOPY;  Surgeon: Jonathon Bellows, MD;  Location: [facility] ENDOSCOPY;  Service: Gastroenterology;  Laterality:  N/A;  . LAPAROSCOPIC SIGMOID COLECTOMY N/A 11/11/2017   Procedure: LAPAROSCOPIC SIGMOID COLECTOMY;  Surgeon: Jules Husbands, MD;  Location: [facility] ORS;  Service: General;  Laterality: N/A;  . MOUTH SURGERY    . PORTACATH PLACEMENT Right 11/29/2017   Procedure: INSERTION PORT-A-CATH;  Surgeon: Jules Husbands, MD;  Location: [place];  Service: General;  Laterality: Right;  May do local and IV sedation. Please have U/S  . TOTAL ABDOMINAL HYSTERECTOMY  05/20/2014   ooperectomy unilateral    Social History   Socioeconomic History  . Marital status: Married    Spouse name: Not on file  . Number of children: Not on file  . Years of education: Not on file  . Highest education level: Not on file  Occupational History  . Not on file  Tobacco Use  . Smoking status: Never Smoker  . Smokeless tobacco: Never Used  Vaping Use  . Vaping Use: Never used  Substance and Sexual Activity  . Alcohol use: No    Alcohol/week: 0.0 standard drinks  . Drug use: No  . Sexual activity: Yes  Other Topics Concern  . Not on file  Social History Narrative  . Not on file   Social Determinants of Health   Financial Resource Strain: Not on file  Food Insecurity: Not on file  Transportation Needs: Not on file  Physical Activity: Not on file  Stress: Not on file  Social Connections: Not on file  Intimate Partner Violence: Not on file    Family History  Problem Relation Age of Onset  . Breast cancer Mother 48       currently 35  . Hypertension Mother   . Thyroid disease Brother   . Hernia Brother   . Thyroid cancer Maternal Grandmother 39       deceased 75  . Stomach cancer Paternal Grandfather        unconfirmed; deceased 23  . Stomach cancer Paternal Aunt 28       deceased 82  . Colon cancer Maternal Aunt 68       also lung cancer  . Stomach cancer Maternal Uncle 79     Current Outpatient Medications:  .  APPLE CIDER VINEGAR PO, Take 1 tablet by mouth daily., Disp: , Rfl:  .   aspirin EC 81 MG tablet, Take 81 mg by mouth daily. Swallow whole., Disp: , Rfl:  .  Biotin 1000 MCG tablet, Take 1,000 mcg by mouth daily., Disp: , Rfl:  .  CINNAMON PO, Take by mouth., Disp: , Rfl:  .  Cyanocobalamin (VITAMIN B 12 PO), Take 2 Doses by mouth daily. , Disp: , Rfl:  .  Garlic 3976 MG CAPS, Take by mouth., Disp: , Rfl:  .  levothyroxine (SYNTHROID) 175 MCG tablet, Take 1 tablet (175 mcg total) by mouth daily before breakfast., Disp: 90 tablet, Rfl: 3 .  Multiple Vitamin tablet, Take 2 tablets by mouth daily. , Disp: , Rfl:  .  OMEGA-3 FATTY ACIDS PO, Take 2 capsules by mouth daily. , Disp: , Rfl:  No current facility-administered medications for this visit.  Facility-Administered Medications Ordered in Other Visits:  .  heparin lock flush 100 unit/mL, 500 Units, Intracatheter, PRN, Sindy Guadeloupe, MD .  sodium chloride flush (NS) 0.9 % injection 10 mL, 10 mL, Intravenous, PRN, Sindy Guadeloupe, MD, 10 mL at 06/13/18 0844  Physical exam:  Vitals:   01/01/21 0958  BP: (!) 186/93  Pulse:  89  Resp: 20  Temp: 98.2 F (36.8 C)  TempSrc: Tympanic  SpO2: 100%  Weight: 215 lb 14.4 oz (97.9 kg)   Physical Exam Constitutional:      General: She is not in acute distress. Eyes:     Extraocular Movements: EOM normal.  Cardiovascular:     Rate and Rhythm: Normal rate and regular rhythm.     Heart sounds: Normal heart sounds.  Pulmonary:     Effort: Pulmonary effort is normal.     Breath sounds: Normal breath sounds.  Abdominal:     General: Bowel sounds are normal.     Palpations: Abdomen is soft.  Skin:    General: Skin is warm and dry.  Neurological:     Mental Status: She is alert and oriented to person, place, and time.      CMP Latest Ref Rng & Units 01/01/2021  Glucose 70 - 99 mg/dL 114(H)  BUN 6 - 20 mg/dL 10  Creatinine 0.44 - 1.00 mg/dL 0.58  Sodium 135 - 145 mmol/L 138  Potassium 3.5 - 5.1 mmol/L 4.0  Chloride 98 - 111 mmol/L 103  CO2 22 - 32 mmol/L 27   Calcium 8.9 - 10.3 mg/dL 9.1  Total Protein 6.5 - 8.1 g/dL 7.1  Total Bilirubin 0.3 - 1.2 mg/dL 0.2(L)  Alkaline Phos 38 - 126 U/L 75  AST 15 - 41 U/L 26  ALT 0 - 44 U/L 34   CBC Latest Ref Rng & Units 01/01/2021  WBC 4.0 - 10.5 K/uL 4.0  Hemoglobin 12.0 - 15.0 g/dL 13.2  Hematocrit 36.0 - 46.0 % 38.9  Platelets 150 - 400 K/uL 175    No images are attached to the encounter.  CT CHEST ABDOMEN PELVIS W CONTRAST  Result Date: 12/30/2020 CLINICAL DATA:  Restaging/surveillance of invasive mucinous sigmoid colon cancer cancer. Completion of chemotherapy, prior 10 cycles of adjuvant chemotherapy and adjuvant radiation therapy. EXAM: CT CHEST, ABDOMEN, AND PELVIS WITH CONTRAST TECHNIQUE: Multidetector CT imaging of the chest, abdomen and pelvis was performed following the standard protocol during bolus administration of intravenous contrast. CONTRAST:  169m OMNIPAQUE IOHEXOL 300 MG/ML  SOLN COMPARISON:  12/05/2019 FINDINGS: CT CHEST FINDINGS Cardiovascular: Unremarkable Mediastinum/Nodes: Unremarkable Lungs/Pleura: No significant abnormality observed. Musculoskeletal: Unremarkable CT ABDOMEN PELVIS FINDINGS Hepatobiliary: Unremarkable Pancreas: Unremarkable Spleen: Unremarkable Adrenals/Urinary Tract: 1.0 by 0.7 cm hypodense lesion in the right kidney lower pole on image 18 of series 7 is stable and likely a cyst although technically too small to characterize. Adrenal glands unremarkable. Urinary bladder appears normal. Stomach/Bowel: Postoperative findings at the rectosigmoid junction with patent anastomosis and no findings of local tumor recurrence. Vascular/Lymphatic: Faint chronic calcification along the confluence of the right internal and external iliac veins, possibly from a remote DVT, no change from prior exams. No pathologic adenopathy. Reproductive: Uterus absent.  Adnexa unremarkable. Other: No supplemental non-categorized findings. Musculoskeletal: Unremarkable IMPRESSION: 1. No findings of  recurrent malignancy. 2. Faint chronic calcification along the confluence of the right internal and external iliac veins, possibly from a remote DVT, no change from prior exams. 3. Small hypodense lesion in the right kidney lower pole is stable and likely a cyst although technically too small to characterize. Electronically Signed   By: WVan ClinesM.D.   On: 12/30/2020 13:03     Assessment and plan- Patient is a 50y.o. 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 this is  a routine surveillance visit for colon cancer  Clinically patient is doing well with no concerning signs and symptoms of recurrence based on today's exam.  She had a recent CT chest abdomen pelvis with contrast on 12/30/2020 which did not show any evidence of recurrent disease either.  I will see her back in 6 months with CBC with differential, CMP and CEA for in person or virtual visit.  Scans to be done on a yearly basis.   Visit Diagnosis 1. Encounter for follow-up surveillance of colon cancer      Dr. Randa Evens, MD, MPH [facility] [phone] 01/01/2021 2:38 PM

## 2021-01-02 LAB — CEA: CEA: 0.4 ng/mL (ref 0.0–4.7)

## 2021-01-27 ENCOUNTER — Ambulatory Visit: Payer: Managed Care, Other (non HMO) | Admitting: Unknown Physician Specialty

## 2021-06-30 ENCOUNTER — Ambulatory Visit: Payer: Managed Care, Other (non HMO) | Admitting: Oncology

## 2021-06-30 ENCOUNTER — Other Ambulatory Visit: Payer: Managed Care, Other (non HMO)

## 2021-12-25 ENCOUNTER — Other Ambulatory Visit: Payer: Self-pay

## 2021-12-25 ENCOUNTER — Encounter: Payer: Self-pay | Admitting: Oncology

## 2021-12-25 ENCOUNTER — Ambulatory Visit: Payer: Managed Care, Other (non HMO) | Admitting: Family Medicine

## 2021-12-25 ENCOUNTER — Encounter: Payer: Self-pay | Admitting: Family Medicine

## 2021-12-25 VITALS — BP 159/77 | HR 81 | Ht 67.0 in | Wt 207.0 lb

## 2021-12-25 DIAGNOSIS — E039 Hypothyroidism, unspecified: Secondary | ICD-10-CM

## 2021-12-25 DIAGNOSIS — E782 Mixed hyperlipidemia: Secondary | ICD-10-CM

## 2021-12-25 DIAGNOSIS — I10 Essential (primary) hypertension: Secondary | ICD-10-CM | POA: Insufficient documentation

## 2021-12-25 DIAGNOSIS — R7309 Other abnormal glucose: Secondary | ICD-10-CM

## 2021-12-25 DIAGNOSIS — Z1231 Encounter for screening mammogram for malignant neoplasm of breast: Secondary | ICD-10-CM

## 2021-12-25 MED ORDER — AMLODIPINE BESYLATE 10 MG PO TABS: 10.0000 mg | ORAL_TABLET | Freq: Every day | ORAL | 1 refills | Status: DC

## 2021-12-25 NOTE — Progress Notes (Signed)
Subjective:    Patient ID: Regina Patel, female    DOB: May 05, 1971, 51 y.o.   MRN: [id number]  Regina Patel is a 51 y.o. female presenting on 12/25/2021 for Hypothyroidism   HPI  Hypothyroidism Last lab done 10/2020 TSH 1.31, and T4 1.6 no new concerns on current med, she continues levothyroxine 196mcg daily She is due for refills, has 1 week left, needed lab before can get refills   Elevated BP without HTN Obesity BMI >32 She monitors BP at home with wrist cuff, readings show avg 130-140 / 80s usually BP can be slightly higher at doctors office. Not on medication She is improving lifestyle diet exercise regimen goal wt loss    Health Maintenance: COVID-19 booster needed, she is considering.  Due for Flu Shot, declines today despite counseling on benefits  Mammogram ordered  She will f/u with GI/Oncology for colonoscopy screening.  Depression screen [facility] 2/9 12/25/2021 11/10/2020 10/26/2019  Decreased Interest 0 0 0  Down, Depressed, Hopeless 0 0 0  PHQ - 2 Score 0 0 0  Altered sleeping 0 - -  Tired, decreased energy 0 - -  Change in appetite 0 - -  Feeling bad or failure about yourself  0 - -  Trouble concentrating 0 - -  Moving slowly or fidgety/restless 0 - -  Suicidal thoughts 0 - -  PHQ-9 Score 0 - -  Difficult doing work/chores Not difficult at all - -    Social History   Tobacco Use   Smoking status: Never   Smokeless tobacco: Never  Vaping Use   Vaping Use: Never used  Substance Use Topics   Alcohol use: No    Alcohol/week: 0.0 standard drinks   Drug use: No    Review of Systems Per HPI unless specifically indicated above     Objective:    BP (!) 159/77    Pulse 81    Ht 5\' 7"  (1.702 m)    Wt 207 lb (93.9 kg)    SpO2 100%    BMI 32.42 kg/m   Wt Readings from Last 3 Encounters:  12/25/21 207 lb (93.9 kg)  01/01/21 215 lb 14.4 oz (97.9 kg)  11/10/20 212 lb 9.6 oz (96.4 kg)    Physical Exam Vitals and nursing note reviewed.  Constitutional:       General: She is not in acute distress.    Appearance: She is well-developed. She is not diaphoretic.     Comments: Well-appearing, comfortable, cooperative  HENT:     Head: Normocephalic and atraumatic.  Eyes:     General:        Right eye: No discharge.        Left eye: No discharge.     Conjunctiva/sclera: Conjunctivae normal.  Neck:     Thyroid: No thyromegaly.  Cardiovascular:     Rate and Rhythm: Normal rate and regular rhythm.     Heart sounds: Normal heart sounds. No murmur heard. Pulmonary:     Effort: Pulmonary effort is normal. No respiratory distress.     Breath sounds: Normal breath sounds. No wheezing or rales.  Musculoskeletal:        General: Normal range of motion.     Cervical back: Normal range of motion and neck supple.  Lymphadenopathy:     Cervical: No cervical adenopathy.  Skin:    General: Skin is warm and dry.     Findings: No erythema or rash.  Neurological:     Mental Status: She  is alert and oriented to person, place, and time.  Psychiatric:        Behavior: Behavior normal.     Comments: Well groomed, good eye contact, normal speech and thoughts   Results for orders placed or performed in visit on 01/01/21  CEA  Result Value Ref Range   CEA 0.4 0.0 - 4.7 ng/mL  Comprehensive metabolic panel  Result Value Ref Range   Sodium 138 135 - 145 mmol/L   Potassium 4.0 3.5 - 5.1 mmol/L   Chloride 103 98 - 111 mmol/L   CO2 27 22 - 32 mmol/L   Glucose, Bld 114 (H) 70 - 99 mg/dL   BUN 10 6 - 20 mg/dL   Creatinine, Ser 0.58 0.44 - 1.00 mg/dL   Calcium 9.1 8.9 - 10.3 mg/dL   Total Protein 7.1 6.5 - 8.1 g/dL   Albumin 4.1 3.5 - 5.0 g/dL   AST 26 15 - 41 U/L   ALT 34 0 - 44 U/L   Alkaline Phosphatase 75 38 - 126 U/L   Total Bilirubin 0.2 (L) 0.3 - 1.2 mg/dL   GFR, Estimated >60 >60 mL/min   Anion gap 8 5 - 15  CBC with Differential/Platelet  Result Value Ref Range   WBC 4.0 4.0 - 10.5 K/uL   RBC 4.41 3.87 - 5.11 MIL/uL   Hemoglobin 13.2 12.0 - 15.0  g/dL   HCT 38.9 36.0 - 46.0 %   MCV 88.2 80.0 - 100.0 fL   MCH 29.9 26.0 - 34.0 pg   MCHC 33.9 30.0 - 36.0 g/dL   RDW 13.5 11.5 - 15.5 %   Platelets 175 150 - 400 K/uL   nRBC 0.0 0.0 - 0.2 %   Neutrophils Relative % 51 %   Neutro Abs 2.0 1.7 - 7.7 K/uL   Lymphocytes Relative 38 %   Lymphs Abs 1.5 0.7 - 4.0 K/uL   Monocytes Relative 9 %   Monocytes Absolute 0.4 0.1 - 1.0 K/uL   Eosinophils Relative 1 %   Eosinophils Absolute 0.1 0.0 - 0.5 K/uL   Basophils Relative 1 %   Basophils Absolute 0.0 0.0 - 0.1 K/uL   Immature Granulocytes 0 %   Abs Immature Granulocytes 0.01 0.00 - 0.07 K/uL      Assessment & Plan:   Problem List Items Addressed This Visit     Mixed hyperlipidemia   Relevant Medications   amLODipine (NORVASC) 10 MG tablet   Other Relevant Orders   Lipid panel   Hypothyroidism - Primary    Due for TSH T4 lab today On Levothyroxine 120mcg daily, will need new rx when ready      Relevant Orders   TSH   T4, free   Essential hypertension   Relevant Medications   amLODipine (NORVASC) 10 MG tablet   Other Visit Diagnoses     Encounter for screening mammogram for malignant neoplasm of breast       Relevant Orders   MM 3D SCREEN BREAST BILATERAL   Abnormal glucose       Relevant Orders   Hemoglobin A1c       Labs ordered today.  HTN New diagnosis today Chronic elevation Will initiate therapy given home readings, repeat calibrate BP cuff appears accurate Start Amlodipine 10mg  daily, can do half tab for 5mg  daily for 2-4 weeks adjust accordingly, see AVS  Orders Placed This Encounter  Procedures   MM 3D SCREEN BREAST BILATERAL    Standing Status:   Future  Standing Expiration Date:   12/25/2022    Order Specific Question:   Reason for Exam (SYMPTOM  OR DIAGNOSIS REQUIRED)    Answer:   Screening bilateral 3D Mammogram [place]    Order Specific Question:   Preferred imaging location?    Answer:   [facility]   Lipid panel    Order Specific  Question:   Has the patient fasted?    Answer:   Yes   Hemoglobin A1c   TSH   T4, free      Meds ordered this encounter  Medications   amLODipine (NORVASC) 10 MG tablet    Sig: Take 1 tablet (10 mg total) by mouth daily. Start with half tab for 5mg  daily for 2-4 weeks, may increase as indicated.    Dispense:  90 tablet    Refill:  1      Follow up plan: Return in about 3 months (around 03/25/2022) for 3 month follow-up HTN med, Thyroid.  Labs ordered today A1c TSH T4 Lipid  [name], DO [facility] 12/25/2021, 8:58 AM

## 2021-12-25 NOTE — Assessment & Plan Note (Signed)
Due for TSH T4 lab today On Levothyroxine 167mcg daily, will need new rx when ready

## 2021-12-25 NOTE — Patient Instructions (Addendum)
Thank you for coming to the office today.  Start Amlodipine half tab for 5mg  daily for 2-4 weeks may increase to whole tab 10mg  if / when ready. Goal BP < 140 / 90, stretch goal preferred is < 135 / 85  Labs today, stay tuned for results on mychart, will re order Levothyroxine or adjust or contact you if need.  Please schedule a Follow-up Appointment to: Return in about 3 months (around 03/25/2022) for 3 month follow-up HTN med, Thyroid.  If you have any other questions or concerns, please feel free to call the office or send a message through [place]. You may also schedule an earlier appointment if necessary.  Additionally, you may be receiving a survey about your experience at our office within a few days to 1 week by e-mail or mail. We value your feedback.  [name], DO [place]

## 2021-12-26 LAB — HEMOGLOBIN A1C
Hgb A1c MFr Bld: 5.5 % of total Hgb (ref <5.7)
Mean Plasma Glucose: 111 mg/dL
eAG (mmol/L): 6.2 mmol/L

## 2021-12-26 LAB — LIPID PANEL
Cholesterol: 278 mg/dL — ABNORMAL HIGH (ref <200)
HDL: 60 mg/dL (ref >=50)
LDL Cholesterol (Calc): 191 mg/dL (calc) — ABNORMAL HIGH
Non-HDL Cholesterol (Calc): 218 mg/dL (calc) — ABNORMAL HIGH (ref <130)
Total CHOL/HDL Ratio: 4.6 (calc) (ref <5.0)
Triglycerides: 135 mg/dL (ref <150)

## 2021-12-26 LAB — T4, FREE: Free T4: 1.7 ng/dL (ref 0.8–1.8)

## 2021-12-26 LAB — TSH: TSH: 0.11 mIU/L — ABNORMAL LOW

## 2021-12-28 ENCOUNTER — Other Ambulatory Visit: Payer: Self-pay | Admitting: Family Medicine

## 2021-12-28 DIAGNOSIS — E034 Atrophy of thyroid (acquired): Secondary | ICD-10-CM

## 2021-12-28 MED ORDER — LEVOTHYROXINE SODIUM 175 MCG PO TABS: 175.0000 ug | ORAL_TABLET | Freq: Every day | ORAL | 3 refills | Status: DC

## 2022-03-26 ENCOUNTER — Ambulatory Visit: Payer: Self-pay | Admitting: Family Medicine

## 2022-04-13 ENCOUNTER — Ambulatory Visit: Payer: Self-pay | Admitting: Family Medicine

## 2023-01-17 ENCOUNTER — Other Ambulatory Visit: Payer: Self-pay | Admitting: Family Medicine

## 2023-01-17 ENCOUNTER — Ambulatory Visit: Payer: 59 | Admitting: Family Medicine

## 2023-01-17 ENCOUNTER — Encounter: Payer: Self-pay | Admitting: Family Medicine

## 2023-01-17 VITALS — BP 154/90 | HR 94 | Ht 67.0 in | Wt 210.0 lb

## 2023-01-17 DIAGNOSIS — M67441 Ganglion, right hand: Secondary | ICD-10-CM | POA: Diagnosis not present

## 2023-01-17 DIAGNOSIS — E034 Atrophy of thyroid (acquired): Secondary | ICD-10-CM | POA: Diagnosis not present

## 2023-01-17 DIAGNOSIS — I10 Essential (primary) hypertension: Secondary | ICD-10-CM

## 2023-01-17 DIAGNOSIS — R7309 Other abnormal glucose: Secondary | ICD-10-CM

## 2023-01-17 DIAGNOSIS — Z0000 Encounter for general adult medical examination without abnormal findings: Secondary | ICD-10-CM

## 2023-01-17 DIAGNOSIS — E782 Mixed hyperlipidemia: Secondary | ICD-10-CM

## 2023-01-17 MED ORDER — AMLODIPINE BESYLATE 5 MG PO TABS: 5.0000 mg | ORAL_TABLET | Freq: Every day | ORAL | 3 refills | Status: DC

## 2023-01-17 NOTE — Assessment & Plan Note (Signed)
Elevated initial BP, repeat manual check improved but still out of range OFF medication, ran out. - Home BP readings had been controlled on med half dose Amlodipine  No known complications    Plan:  1. Re order Amlodipine '5mg'$  daily, with rx '5mg'$  tab instead of half of '10mg'$  2. Encourage improved lifestyle - low sodium diet, regular exercise 3. Continue monitor BP outside office, bring readings to next visit, if persistently >140/90 or new symptoms notify office sooner

## 2023-01-17 NOTE — Assessment & Plan Note (Signed)
Due for TSH T4 lab today On Levothyroxine 136mg daily, will need new rx when lab result is in, at same dose or adjusted.

## 2023-01-17 NOTE — Progress Notes (Signed)
Subjective:    Patient ID: Regina Patel, female    DOB: January 18, 1971, 52 y.o.   MRN: [id number]  Regina Patel is a 52 y.o. female presenting on 01/17/2023 for Hypothyroidism and Hypertension   HPI  Hypothyroidism Today due for thyroid blood panel. Pending results. Currently on Levothyroxine 127mg daily, tolerating well and not having any problems with this. Pending new result for re order, has 1 week of pills left   Essential Hypertension Obesity BMI >32 She monitors BP at home with wrist cuff, readings show avg 107 to 127s / 82-83 average Current Med: None - OFF Amlodipine was taking half of '10mg'$  tab. Denies CP, dyspnea, HA, edema, dizziness / lightheadedness   She is improving lifestyle diet exercise regimen goal wt loss     Health Maintenance:    Due for Flu Shot, declines today despite counseling on benefits   Did not obtain last mammogram. We can follow at next visit.   Last Colonoscopy 11/10/18 Dr AVicente Males Impression:           - One 3 mm polyp in the ascending colon, removed with a                        cold biopsy forcep                        - Repeat colonoscopy in 3 years for surveillance.  She is overdue and has not heard back on scheduling yet. She would like to schedule this.      12/25/2021    8:52 AM 11/10/2020   11:29 AM 10/26/2019    8:50 AM  Depression screen PHQ 2/9  Decreased Interest 0 0 0  Down, Depressed, Hopeless 0 0 0  PHQ - 2 Score 0 0 0  Altered sleeping 0    Tired, decreased energy 0    Change in appetite 0    Feeling bad or failure about yourself  0    Trouble concentrating 0    Moving slowly or fidgety/restless 0    Suicidal thoughts 0    PHQ-9 Score 0    Difficult doing work/chores Not difficult at all      Social History   Tobacco Use   Smoking status: Never   Smokeless tobacco: Never  Vaping Use   Vaping Use: Never used  Substance Use Topics   Alcohol use: No    Alcohol/week: 0.0 standard drinks of alcohol   Drug use: No     Review of Systems Per HPI unless specifically indicated above     Objective:    BP (!) 154/90 (BP Location: Left Arm, Cuff Size: Normal)   Pulse 94   Ht '5\' 7"'$  (1.702 m)   Wt 210 lb (95.3 kg)   SpO2 100%   BMI 32.89 kg/m   Wt Readings from Last 3 Encounters:  01/17/23 210 lb (95.3 kg)  12/25/21 207 lb (93.9 kg)  01/01/21 215 lb 14.4 oz (97.9 kg)    Physical Exam Vitals and nursing note reviewed.  Constitutional:      General: She is not in acute distress.    Appearance: Normal appearance. She is well-developed. She is obese. She is not diaphoretic.     Comments: Well-appearing, comfortable, cooperative  HENT:     Head: Normocephalic and atraumatic.  Eyes:     General:        Right eye: No discharge.  Left eye: No discharge.     Conjunctiva/sclera: Conjunctivae normal.  Cardiovascular:     Rate and Rhythm: Normal rate.  Pulmonary:     Effort: Pulmonary effort is normal.  Skin:    General: Skin is warm and dry.     Findings: Lesion (R index finger dorsal 1-2 mm firm nodular cyst) present. No erythema or rash.  Neurological:     Mental Status: She is alert and oriented to person, place, and time.  Psychiatric:        Mood and Affect: Mood normal.        Behavior: Behavior normal.        Thought Content: Thought content normal.     Comments: Well groomed, good eye contact, normal speech and thoughts      Results for orders placed or performed in visit on 12/25/21  Lipid panel  Result Value Ref Range   Cholesterol 278 (H) <200 mg/dL   HDL 60 > OR = 50 mg/dL   Triglycerides 135 <150 mg/dL   LDL Cholesterol (Calc) 191 (H) mg/dL (calc)   Total CHOL/HDL Ratio 4.6 <5.0 (calc)   Non-HDL Cholesterol (Calc) 218 (H) <130 mg/dL (calc)  Hemoglobin A1c  Result Value Ref Range   Hgb A1c MFr Bld 5.5 <5.7 % of total Hgb   Mean Plasma Glucose 111 mg/dL   eAG (mmol/L) 6.2 mmol/L  TSH  Result Value Ref Range   TSH 0.11 (L) mIU/L  T4, free  Result Value Ref Range    Free T4 1.7 0.8 - 1.8 ng/dL      Assessment & Plan:   Problem List Items Addressed This Visit     Essential hypertension    Elevated initial BP, repeat manual check improved but still out of range OFF medication, ran out. - Home BP readings had been controlled on med half dose Amlodipine  No known complications    Plan:  1. Re order Amlodipine '5mg'$  daily, with rx '5mg'$  tab instead of half of '10mg'$  2. Encourage improved lifestyle - low sodium diet, regular exercise 3. Continue monitor BP outside office, bring readings to next visit, if persistently >140/90 or new symptoms notify office sooner      Relevant Medications   amLODipine (NORVASC) 5 MG tablet   Ganglion cyst of finger of right hand   Hypothyroidism - Primary    Due for TSH T4 lab today On Levothyroxine 157mg daily, will need new rx when lab result is in, at same dose or adjusted.      Relevant Orders   TSH   T4, free    #Ganglion vs synovial cyst R index finger dorsal aspect, mild uncomplicated. Not impacting her joint movement or pain.  Orders Placed This Encounter  Procedures   TSH   T4, free     Meds ordered this encounter  Medications   amLODipine (NORVASC) 5 MG tablet    Sig: Take 1 tablet (5 mg total) by mouth daily.    Dispense:  90 tablet    Refill:  3      Follow up plan: Return in about 3 months (around 04/18/2023) for 3 month fasting lab only then 1 week later Annual Physical.  Future labs ordered for 04/16/23   ANobie Putnam [facility] 01/17/2023, 10:02 AM

## 2023-01-17 NOTE — Patient Instructions (Addendum)
Thank you for coming to the office today.  Thyroid panel today Stay tuned for lab results on MyChart Will order 90 day of same or other dose based on result.  Re ordered 90 days of Amlodipine '5mg'$  daily, take one whole tab daily.  Looks like a small synovial cyst of finger joint. It feels firm enough that it may not drain easily and ideally would reabsorb in the future. As long as it doesn't interfere with your finger mobility, it is okay to leave it be.  START anti inflammatory topical - OTC Voltaren (generic Diclofenac) topical 2-4 times a day as needed for pain swelling of affected joint for 1-2 weeks or longer.   DUE for FASTING BLOOD WORK (no food or drink after midnight before the lab appointment, only water or coffee without cream/sugar on the morning of)  SCHEDULE "Lab Only" visit in the morning at the clinic for lab draw in  3 MONTHS   - Make sure Lab Only appointment is at about 1 week before your next appointment, so that results will be available  For Lab Results, once available within 2-3 days of blood draw, you can can log in to MyChart online to view your results and a brief explanation. Also, we can discuss results at next follow-up visit.   Please schedule a Follow-up Appointment to: Return in about 3 months (around 04/18/2023) for 3 month fasting lab only then 1 week later Annual Physical.  If you have any other questions or concerns, please feel free to call the office or send a message through [place]. You may also schedule an earlier appointment if necessary.  Additionally, you may be receiving a survey about your experience at our office within a few days to 1 week by e-mail or mail. We value your feedback.  Regina Putnam, DO [place]

## 2023-01-18 LAB — T4, FREE: Free T4: 1.8 ng/dL (ref 0.8–1.8)

## 2023-01-18 LAB — TSH: TSH: 0.2 mIU/L — ABNORMAL LOW

## 2023-01-18 MED ORDER — LEVOTHYROXINE SODIUM 175 MCG PO TABS: 175.0000 ug | ORAL_TABLET | Freq: Every day | ORAL | 3 refills | Status: DC

## 2023-01-18 NOTE — Addendum Note (Signed)
Addended by: Olin Hauser on: 01/18/2023 05:36 PM   Modules accepted: Orders

## 2023-04-13 ENCOUNTER — Encounter: Payer: Self-pay | Admitting: Oncology

## 2023-04-13 ENCOUNTER — Telehealth: Payer: Self-pay

## 2023-04-13 ENCOUNTER — Other Ambulatory Visit: Payer: Self-pay

## 2023-04-13 DIAGNOSIS — Z85038 Personal history of other malignant neoplasm of large intestine: Secondary | ICD-10-CM

## 2023-04-13 MED ORDER — NA SULFATE-K SULFATE-MG SULF 17.5-3.13-1.6 GM/177ML PO SOLN: 1.0000 | Freq: Once | ORAL | 0 refills | Status: AC

## 2023-04-13 NOTE — Telephone Encounter (Signed)
Patient is calling to schedule her repeat colonoscopy with Dr. Tobi Bastos. Please call patient to schedule.

## 2023-04-13 NOTE — Telephone Encounter (Signed)
Gastroenterology Pre-Procedure Review  Request Date: 05/04/23 Requesting Physician: Dr. Tobi Bastos  PATIENT REVIEW QUESTIONS: The patient responded to the following health history questions as indicated:    1. Are you having any GI issues? no 2. Do you have a personal history of Polyps? yes (Pt has a history of colon cancer.  Last colonoscopy performed by Dr. Tobi Bastos  11/10/2018 polyps were present) 3. Do you have a family history of Colon Cancer or Polyps? no 4. Diabetes Mellitus? no 5. Joint replacements in the past 12 months?no 6. Major health problems in the past 3 months?no 7. Any artificial heart valves, MVP, or defibrillator?no    MEDICATIONS & ALLERGIES:    Patient reports the following regarding taking any anticoagulation/antiplatelet therapy:   Plavix, Coumadin, Eliquis, Xarelto, Lovenox, Pradaxa, Brilinta, or Effient? no Aspirin? no  Patient confirms/reports the following medications:  Current Outpatient Medications  Medication Sig Dispense Refill   amLODipine (NORVASC) 5 MG tablet Take 1 tablet (5 mg total) by mouth daily. 90 tablet 3   APPLE CIDER VINEGAR PO Take 1 tablet by mouth daily.     aspirin EC 81 MG tablet Take 81 mg by mouth daily. Swallow whole.     Biotin 1000 MCG tablet Take 1,000 mcg by mouth daily.     CINNAMON PO Take by mouth.     Cyanocobalamin (VITAMIN B 12 PO) Take 2 Doses by mouth daily.      Garlic 1000 MG CAPS Take by mouth.     levothyroxine (SYNTHROID) 175 MCG tablet Take 1 tablet (175 mcg total) by mouth daily before breakfast. 90 tablet 3   Multiple Vitamin tablet Take 2 tablets by mouth daily.      OMEGA-3 FATTY ACIDS PO Take 2 capsules by mouth daily.      No current facility-administered medications for this visit.   Facility-Administered Medications Ordered in Other Visits  Medication Dose Route Frequency Provider Last Rate Last Admin   heparin lock flush 100 unit/mL  500 Units Intracatheter PRN Creig Hines, MD       sodium chloride flush  (NS) 0.9 % injection 10 mL  10 mL Intravenous PRN Creig Hines, MD   10 mL at 06/13/18 0844    Patient confirms/reports the following allergies:  No Known Allergies  No orders of the defined types were placed in this encounter.   AUTHORIZATION INFORMATION Primary Insurance: 1D#: Group #:  Secondary Insurance: 1D#: Group #:  SCHEDULE INFORMATION: Date: 05/04/23 Time: Location: [facility]

## 2023-04-18 ENCOUNTER — Other Ambulatory Visit: Payer: 59

## 2023-04-18 DIAGNOSIS — E782 Mixed hyperlipidemia: Secondary | ICD-10-CM

## 2023-04-18 DIAGNOSIS — I10 Essential (primary) hypertension: Secondary | ICD-10-CM

## 2023-04-18 DIAGNOSIS — R7309 Other abnormal glucose: Secondary | ICD-10-CM

## 2023-04-18 DIAGNOSIS — Z0000 Encounter for general adult medical examination without abnormal findings: Secondary | ICD-10-CM

## 2023-04-18 LAB — CBC WITH DIFFERENTIAL/PLATELET
Absolute Monocytes: 394 cells/uL (ref 200–950)
Basophils Absolute: 19 cells/uL (ref 0–200)
Eosinophils Absolute: 82 cells/uL (ref 15–500)
Hemoglobin: 13.6 g/dL (ref 11.7–15.5)
MCH: 28.9 pg (ref 27.0–33.0)
Platelets: 211 10*3/uL (ref 140–400)
RBC: 4.71 10*6/uL (ref 3.80–5.10)

## 2023-04-19 LAB — COMPLETE METABOLIC PANEL WITH GFR
AG Ratio: 1.6 (calc) (ref 1.0–2.5)
ALT: 37 U/L — ABNORMAL HIGH (ref 6–29)
AST: 25 U/L (ref 10–35)
Albumin: 4.3 g/dL (ref 3.6–5.1)
Alkaline phosphatase (APISO): 94 U/L (ref 37–153)
BUN: 9 mg/dL (ref 7–25)
CO2: 28 mmol/L (ref 20–32)
Calcium: 9.5 mg/dL (ref 8.6–10.4)
Chloride: 105 mmol/L (ref 98–110)
Creat: 0.6 mg/dL (ref 0.50–1.03)
Globulin: 2.7 g/dL (calc) (ref 1.9–3.7)
Glucose, Bld: 90 mg/dL (ref 65–99)
Potassium: 4.2 mmol/L (ref 3.5–5.3)
Sodium: 141 mmol/L (ref 135–146)
Total Bilirubin: 0.3 mg/dL (ref 0.2–1.2)
Total Protein: 7 g/dL (ref 6.1–8.1)
eGFR: 107 mL/min/{1.73_m2} (ref >=60)

## 2023-04-19 LAB — HEMOGLOBIN A1C
Hgb A1c MFr Bld: 5.8 % of total Hgb — ABNORMAL HIGH (ref <5.7)
Mean Plasma Glucose: 120 mg/dL
eAG (mmol/L): 6.6 mmol/L

## 2023-04-19 LAB — LIPID PANEL
Cholesterol: 247 mg/dL — ABNORMAL HIGH (ref <200)
HDL: 59 mg/dL (ref >=50)
LDL Cholesterol (Calc): 148 mg/dL (calc) — ABNORMAL HIGH
Non-HDL Cholesterol (Calc): 188 mg/dL (calc) — ABNORMAL HIGH (ref <130)
Total CHOL/HDL Ratio: 4.2 (calc) (ref <5.0)
Triglycerides: 257 mg/dL — ABNORMAL HIGH (ref <150)

## 2023-04-19 LAB — CBC WITH DIFFERENTIAL/PLATELET
Basophils Relative: 0.4 %
Eosinophils Relative: 1.7 %
HCT: 42.8 % (ref 35.0–45.0)
Lymphs Abs: 2074 cells/uL (ref 850–3900)
MCHC: 31.8 g/dL — ABNORMAL LOW (ref 32.0–36.0)
MCV: 90.9 fL (ref 80.0–100.0)
MPV: 10.1 fL (ref 7.5–12.5)
Monocytes Relative: 8.2 %
Neutro Abs: 2232 cells/uL (ref 1500–7800)
Neutrophils Relative %: 46.5 %
RDW: 14 % (ref 11.0–15.0)
Total Lymphocyte: 43.2 %
WBC: 4.8 10*3/uL (ref 3.8–10.8)

## 2023-04-21 ENCOUNTER — Encounter: Payer: 59 | Admitting: Family Medicine

## 2023-04-26 ENCOUNTER — Encounter: Payer: Self-pay | Admitting: Family Medicine

## 2023-04-26 ENCOUNTER — Ambulatory Visit (INDEPENDENT_AMBULATORY_CARE_PROVIDER_SITE_OTHER): Payer: 59 | Admitting: Family Medicine

## 2023-04-26 VITALS — BP 138/78 | HR 98 | Ht 67.0 in | Wt 216.0 lb

## 2023-04-26 DIAGNOSIS — Z1231 Encounter for screening mammogram for malignant neoplasm of breast: Secondary | ICD-10-CM

## 2023-04-26 DIAGNOSIS — I10 Essential (primary) hypertension: Secondary | ICD-10-CM

## 2023-04-26 DIAGNOSIS — Z85038 Personal history of other malignant neoplasm of large intestine: Secondary | ICD-10-CM

## 2023-04-26 DIAGNOSIS — E782 Mixed hyperlipidemia: Secondary | ICD-10-CM

## 2023-04-26 DIAGNOSIS — Z0000 Encounter for general adult medical examination without abnormal findings: Secondary | ICD-10-CM

## 2023-04-26 DIAGNOSIS — R7309 Other abnormal glucose: Secondary | ICD-10-CM

## 2023-04-26 DIAGNOSIS — E039 Hypothyroidism, unspecified: Secondary | ICD-10-CM | POA: Diagnosis not present

## 2023-04-26 DIAGNOSIS — E669 Obesity, unspecified: Secondary | ICD-10-CM | POA: Diagnosis not present

## 2023-04-26 NOTE — Progress Notes (Signed)
Subjective:    Patient ID: Regina Patel, female    DOB: [birth year], 52 y.o.   MRN: [id number]  Regina Patel is a 52 y.o. female presenting on 04/26/2023 for Annual Exam   HPI  Here for Annual Physical and Lab Review   Elevated A1c A1c 5.8 now, prior 5.4 to 5.5 Lifestyle: - Diet ( goal to improve low carb) - Exercise (Walking and yardwork and exercise bike) Mother Pre-Diabetes Denies hypoglycemia, polyuria, visual changes, numbness or tingling.  Hyperlipidemia Lab results reviewed LDL 191 > 148  Low cholesterol diet has improved Not on medication\  Hypothyroidism Last lab 12/2022, normal range Currently on Levothyroxine daily, tolerating well and not having any problems with this.   Essential Hypertension Obesity BMI >33 She monitors BP at home with wrist cuff, readings show avg 107 to 127s / 82-83 average Current Med: Amlodipine 5mg  daily Denies CP, dyspnea, HA, edema, dizziness / lightheadedness   She is improving lifestyle diet exercise regimen goal wt loss     Health Maintenance:  Due for Mammogram screening - will sign order today. She will schedule.     Last Colonoscopy 11/10/18 Dr Tobi Bastos   Impression:           - One 3 mm polyp in the ascending colon, removed with a                        cold biopsy forcep   She is due for Colonoscopy now. Already scheduled 05/04/23 - Dr Tobi Bastos [facility]  Shingles vaccine due age 58+. She will check with pharmacist.      04/26/2023    3:56 PM 12/25/2021    8:52 AM 11/10/2020   11:29 AM  Depression screen PHQ 2/9  Decreased Interest 0 0 0  Down, Depressed, Hopeless 0 0 0  PHQ - 2 Score 0 0 0  Altered sleeping  0   Tired, decreased energy  0   Change in appetite  0   Feeling bad or failure about yourself   0   Trouble concentrating  0   Moving slowly or fidgety/restless  0   Suicidal thoughts  0   PHQ-9 Score  0   Difficult doing work/chores  Not difficult at all     Social History   Tobacco Use   Smoking status:  Never   Smokeless tobacco: Never  Vaping Use   Vaping Use: Never used  Substance Use Topics   Alcohol use: No    Alcohol/week: 0.0 standard drinks of alcohol   Drug use: No    Review of Systems  Constitutional:  Negative for activity change, appetite change, chills, diaphoresis, fatigue and fever.  HENT:  Negative for congestion and hearing loss.   Eyes:  Negative for visual disturbance.  Respiratory:  Negative for cough, chest tightness, shortness of breath and wheezing.   Cardiovascular:  Negative for chest pain, palpitations and leg swelling.  Gastrointestinal:  Negative for abdominal pain, constipation, diarrhea, nausea and vomiting.  Genitourinary:  Negative for dysuria, frequency and hematuria.  Musculoskeletal:  Negative for arthralgias and neck pain.  Skin:  Negative for rash.  Neurological:  Negative for dizziness, weakness, light-headedness, numbness and headaches.  Hematological:  Negative for adenopathy.  Psychiatric/Behavioral:  Negative for behavioral problems, dysphoric mood and sleep disturbance.    Per HPI unless specifically indicated above     Objective:    BP 138/78   Pulse 98   Ht 5\' 7"  (1.702  m)   Wt 216 lb (98 kg)   SpO2 98%   BMI 33.83 kg/m   Wt Readings from Last 3 Encounters:  04/26/23 216 lb (98 kg)  01/17/23 210 lb (95.3 kg)  12/25/21 207 lb (93.9 kg)    Physical Exam Vitals and nursing note reviewed.  Constitutional:      General: She is not in acute distress.    Appearance: She is well-developed. She is not diaphoretic.     Comments: Well-appearing, comfortable, cooperative  HENT:     Head: Normocephalic and atraumatic.  Eyes:     General:        Right eye: No discharge.        Left eye: No discharge.     Conjunctiva/sclera: Conjunctivae normal.     Pupils: Pupils are equal, round, and reactive to light.  Neck:     Thyroid: No thyromegaly.     Vascular: No carotid bruit.  Cardiovascular:     Rate and Rhythm: Normal rate and  regular rhythm.     Pulses: Normal pulses.     Heart sounds: Normal heart sounds. No murmur heard. Pulmonary:     Effort: Pulmonary effort is normal. No respiratory distress.     Breath sounds: Normal breath sounds. No wheezing or rales.  Abdominal:     General: Bowel sounds are normal. There is no distension.     Palpations: Abdomen is soft. There is no mass.     Tenderness: There is no abdominal tenderness.  Musculoskeletal:        General: No tenderness. Normal range of motion.     Cervical back: Normal range of motion and neck supple.     Right lower leg: No edema.     Left lower leg: No edema.     Comments: Upper / Lower Extremities: - Normal muscle tone, strength bilateral upper extremities 5/5, lower extremities 5/5  Lymphadenopathy:     Cervical: No cervical adenopathy.  Skin:    General: Skin is warm and dry.     Findings: No erythema or rash.  Neurological:     Mental Status: She is alert and oriented to person, place, and time.     Comments: Distal sensation intact to light touch all extremities  Psychiatric:        Mood and Affect: Mood normal.        Behavior: Behavior normal.        Thought Content: Thought content normal.     Comments: Well groomed, good eye contact, normal speech and thoughts       Results for orders placed or performed in visit on 04/18/23  Lipid panel  Result Value Ref Range   Cholesterol 247 (H) <200 mg/dL   HDL 59 > OR = 50 mg/dL   Triglycerides 119 (H) <150 mg/dL   LDL Cholesterol (Calc) 148 (H) mg/dL (calc)   Total CHOL/HDL Ratio 4.2 <5.0 (calc)   Non-HDL Cholesterol (Calc) 188 (H) <130 mg/dL (calc)  COMPLETE METABOLIC PANEL WITH GFR  Result Value Ref Range   Glucose, Bld 90 65 - 99 mg/dL   BUN 9 7 - 25 mg/dL   Creat 1.47 8.29 - 5.62 mg/dL   eGFR 130 > OR = 60 QM/VHQ/4.69G2   BUN/Creatinine Ratio SEE NOTE: 6 - 22 (calc)   Sodium 141 135 - 146 mmol/L   Potassium 4.2 3.5 - 5.3 mmol/L   Chloride 105 98 - 110 mmol/L   CO2 28 20  - 32 mmol/L  Calcium 9.5 8.6 - 10.4 mg/dL   Total Protein 7.0 6.1 - 8.1 g/dL   Albumin 4.3 3.6 - 5.1 g/dL   Globulin 2.7 1.9 - 3.7 g/dL (calc)   AG Ratio 1.6 1.0 - 2.5 (calc)   Total Bilirubin 0.3 0.2 - 1.2 mg/dL   Alkaline phosphatase (APISO) 94 37 - 153 U/L   AST 25 10 - 35 U/L   ALT 37 (H) 6 - 29 U/L  CBC with Differential/Platelet  Result Value Ref Range   WBC 4.8 3.8 - 10.8 Thousand/uL   RBC 4.71 3.80 - 5.10 Million/uL   Hemoglobin 13.6 11.7 - 15.5 g/dL   HCT 40.9 81.1 - 91.4 %   MCV 90.9 80.0 - 100.0 fL   MCH 28.9 27.0 - 33.0 pg   MCHC 31.8 (L) 32.0 - 36.0 g/dL   RDW 78.2 95.6 - 21.3 %   Platelets 211 140 - 400 Thousand/uL   MPV 10.1 7.5 - 12.5 fL   Neutro Abs 2,232 1,500 - 7,800 cells/uL   Lymphs Abs 2,074 850 - 3,900 cells/uL   Absolute Monocytes 394 200 - 950 cells/uL   Eosinophils Absolute 82 15 - 500 cells/uL   Basophils Absolute 19 0 - 200 cells/uL   Neutrophils Relative % 46.5 %   Total Lymphocyte 43.2 %   Monocytes Relative 8.2 %   Eosinophils Relative 1.7 %   Basophils Relative 0.4 %  Hemoglobin A1c  Result Value Ref Range   Hgb A1c MFr Bld 5.8 (H) <5.7 % of total Hgb   Mean Plasma Glucose 120 mg/dL   eAG (mmol/L) 6.6 mmol/L      Assessment & Plan:   Problem List Items Addressed This Visit     Elevated hemoglobin A1c    A1c 5.8 Elevated result from prior Focus on diet modifications      Essential hypertension    Stable BP, controlled No known complications    Plan:  1. Continue Amlodipine 5mg  daily 2. Encourage improved lifestyle - low sodium diet, regular exercise 3. Continue monitor BP outside office, bring readings to next visit, if persistently >140/90 or new symptoms notify office sooner      History of colon cancer   Hypothyroidism    Thyroid panel normal range in Jan 2024 On Levothyroxine daily      Mixed hyperlipidemia    Improved LDL to 148 On diet lifestyle improve Not on statin therapy      Obesity (BMI  30.0-34.9)    Weight management discussion Encourage lifestyle intervention      Other Visit Diagnoses     Annual physical exam    -  Primary   Encounter for screening mammogram for malignant neoplasm of breast       Relevant Orders   MM 3D SCREENING MAMMOGRAM BILATERAL BREAST       Updated Health Maintenance information Reviewed recent lab results with patient Encouraged improvement to lifestyle with diet and exercise Goal of weight loss  Ordered Mammogram  Upcoming Colonoscopy scheduled 05/04/23  Recommend Shingrix Shingles vaccine 2 doses, 2-6 months apart. At the pharmacy.  Orders Placed This Encounter  Procedures   MM 3D SCREENING MAMMOGRAM BILATERAL BREAST    Standing Status:   Future    Standing Expiration Date:   04/25/2024    Order Specific Question:   Reason for Exam (SYMPTOM  OR DIAGNOSIS REQUIRED)    Answer:   Screening bilateral 3D Mammogram Tom[place]    Order Specific Question:   Preferred  imaging location?    Answer:   [facility]     No orders of the defined types were placed in this encounter.     Follow up plan: Return in about 1 year (around 04/25/2024) for 1 year fasting lab only then 1 week later Annual Physical.  Future labs ordered for 04/2024   Saralyn Pilar, DO [facility] 04/26/2023, 4:10 PM

## 2023-04-26 NOTE — Patient Instructions (Addendum)
Thank you for coming to the office today.  For Mammogram screening for breast cancer   Call the Imaging Center below anytime to schedule your own appointment now that order has been placed.  [facility] [address] [place], [place] [address] Phone: [phone]  ---  Upcoming Colonoscopy scheduled 05/04/23  Recommend Shingrix Shingles vaccine 2 doses, 2-6 months apart. At the pharmacy.  DUE for FASTING BLOOD WORK (no food or drink after midnight before the lab appointment, only water or coffee without cream/sugar on the morning of)  SCHEDULE "Lab Only" visit in the morning at the clinic for lab draw in 1 YEAR  - Make sure Lab Only appointment is at about 1 week before your next appointment, so that results will be available  For Lab Results, once available within 2-3 days of blood draw, you can can log in to MyChart online to view your results and a brief explanation. Also, we can discuss results at next follow-up visit.    Please schedule a Follow-up Appointment to: Return in about 1 year (around 04/25/2024) for 1 year fasting lab only then 1 week later Annual Physical.  If you have any other questions or concerns, please feel free to call the office or send a message through MyChart. You may also schedule an earlier appointment if necessary.  Additionally, you may be receiving a survey about your experience at our office within a few days to 1 week by e-mail or mail. We value your feedback.  Saralyn Pilar, DO [facility], [place]

## 2023-04-27 ENCOUNTER — Other Ambulatory Visit: Payer: Self-pay | Admitting: Family Medicine

## 2023-04-27 DIAGNOSIS — I10 Essential (primary) hypertension: Secondary | ICD-10-CM

## 2023-04-27 DIAGNOSIS — Z0000 Encounter for general adult medical examination without abnormal findings: Secondary | ICD-10-CM

## 2023-04-27 DIAGNOSIS — R7309 Other abnormal glucose: Secondary | ICD-10-CM | POA: Insufficient documentation

## 2023-04-27 DIAGNOSIS — E039 Hypothyroidism, unspecified: Secondary | ICD-10-CM

## 2023-04-27 DIAGNOSIS — Z85038 Personal history of other malignant neoplasm of large intestine: Secondary | ICD-10-CM | POA: Insufficient documentation

## 2023-04-27 DIAGNOSIS — E782 Mixed hyperlipidemia: Secondary | ICD-10-CM

## 2023-04-27 DIAGNOSIS — E669 Obesity, unspecified: Secondary | ICD-10-CM

## 2023-04-27 NOTE — Assessment & Plan Note (Signed)
Stable BP, controlled No known complications    Plan:  1. Continue Amlodipine 5mg  daily 2. Encourage improved lifestyle - low sodium diet, regular exercise 3. Continue monitor BP outside office, bring readings to next visit, if persistently >140/90 or new symptoms notify office sooner

## 2023-04-27 NOTE — Assessment & Plan Note (Signed)
A1c 5.8 Elevated result from prior Focus on diet modifications

## 2023-04-27 NOTE — Assessment & Plan Note (Signed)
Thyroid panel normal range in Jan 2024 On Levothyroxine daily

## 2023-04-27 NOTE — Assessment & Plan Note (Signed)
Improved LDL to 148 On diet lifestyle improve Not on statin therapy

## 2023-04-27 NOTE — Assessment & Plan Note (Signed)
Weight management discussion Encourage lifestyle intervention

## 2023-05-03 ENCOUNTER — Encounter: Payer: Self-pay | Admitting: Gastroenterology

## 2023-05-04 ENCOUNTER — Encounter: Payer: Self-pay | Admitting: Gastroenterology

## 2023-05-04 ENCOUNTER — Ambulatory Visit
Admission: RE | Admit: 2023-05-04 | Discharge: 2023-05-04 | Disposition: A | Discharge status: Home / Self Care | Payer: 59 | Attending: Gastroenterology | Admitting: Gastroenterology

## 2023-05-04 ENCOUNTER — Encounter
Admission: RE | Disposition: A | Discharge status: Home / Self Care | Payer: Self-pay | Source: Home / Self Care | Attending: Gastroenterology

## 2023-05-04 ENCOUNTER — Ambulatory Visit: Payer: 59 | Admitting: Anesthesiology

## 2023-05-04 DIAGNOSIS — D120 Benign neoplasm of cecum: Secondary | ICD-10-CM | POA: Insufficient documentation

## 2023-05-04 DIAGNOSIS — I10 Essential (primary) hypertension: Secondary | ICD-10-CM | POA: Insufficient documentation

## 2023-05-04 DIAGNOSIS — Z9221 Personal history of antineoplastic chemotherapy: Secondary | ICD-10-CM | POA: Insufficient documentation

## 2023-05-04 DIAGNOSIS — Z85038 Personal history of other malignant neoplasm of large intestine: Secondary | ICD-10-CM | POA: Insufficient documentation

## 2023-05-04 DIAGNOSIS — Z923 Personal history of irradiation: Secondary | ICD-10-CM | POA: Diagnosis not present

## 2023-05-04 DIAGNOSIS — D124 Benign neoplasm of descending colon: Secondary | ICD-10-CM | POA: Insufficient documentation

## 2023-05-04 DIAGNOSIS — D126 Benign neoplasm of colon, unspecified: Secondary | ICD-10-CM

## 2023-05-04 DIAGNOSIS — E669 Obesity, unspecified: Secondary | ICD-10-CM | POA: Diagnosis not present

## 2023-05-04 DIAGNOSIS — Z6832 Body mass index (BMI) 32.0-32.9, adult: Secondary | ICD-10-CM | POA: Insufficient documentation

## 2023-05-04 DIAGNOSIS — D122 Benign neoplasm of ascending colon: Secondary | ICD-10-CM | POA: Diagnosis not present

## 2023-05-04 DIAGNOSIS — Z980 Intestinal bypass and anastomosis status: Secondary | ICD-10-CM | POA: Insufficient documentation

## 2023-05-04 DIAGNOSIS — Z1211 Encounter for screening for malignant neoplasm of colon: Secondary | ICD-10-CM | POA: Insufficient documentation

## 2023-05-04 DIAGNOSIS — E039 Hypothyroidism, unspecified: Secondary | ICD-10-CM | POA: Insufficient documentation

## 2023-05-04 HISTORY — PX: COLONOSCOPY WITH PROPOFOL: SHX5780

## 2023-05-04 SURGERY — COLONOSCOPY WITH PROPOFOL
Anesthesia: General

## 2023-05-04 MED ORDER — LIDOCAINE HCL (CARDIAC) PF 100 MG/5ML IV SOSY
PREFILLED_SYRINGE | INTRAVENOUS | Status: DC | PRN
  Administered 2023-05-04: 50 mg via INTRAVENOUS

## 2023-05-04 MED ORDER — PROPOFOL 10 MG/ML IV BOLUS
INTRAVENOUS | Status: DC | PRN
  Administered 2023-05-04: 80 mg via INTRAVENOUS

## 2023-05-04 MED ORDER — SODIUM CHLORIDE 0.9 % IV SOLN
INTRAVENOUS | Status: DC
  Administered 2023-05-04: 1000 mL via INTRAVENOUS

## 2023-05-04 MED ORDER — PROPOFOL 500 MG/50ML IV EMUL
INTRAVENOUS | Status: DC | PRN
  Administered 2023-05-04: 150 ug/kg/min via INTRAVENOUS

## 2023-05-04 NOTE — Transfer of Care (Signed)
Immediate Anesthesia Transfer of Care Note  Patient: Regina Patel  Procedure(s) Performed: COLONOSCOPY WITH PROPOFOL  Patient Location: PACU  Anesthesia Type:General  Level of Consciousness: awake, alert , and oriented  Airway & Oxygen Therapy: Patient Spontanous Breathing  Post-op Assessment: Report given to RN and Post -op Vital signs reviewed and stable  Post vital signs: Reviewed and stable  Last Vitals:  Vitals Value Taken Time  BP 107/57 05/04/23 1305  Temp 37 C 05/04/23 1305  Pulse 79 05/04/23 1305  Resp 20 05/04/23 1305  SpO2 97 % 05/04/23 1305    Last Pain:  Vitals:   05/04/23 1305  TempSrc: Temporal  PainSc: 0-No pain         Complications: No notable events documented.

## 2023-05-04 NOTE — H&P (Signed)
Wyline Mood, MD [address], [address], [place], [place], [address] [address], [place], [place], [address] Phone: [phone]  Fax: [phone]  Primary Care Physician:  Smitty Cords, DO   Pre-Procedure History & Physical: HPI:  Regina Patel is a 52 y.o. female is here for an colonoscopy.   Past Medical History:  Diagnosis Date   Anemia    Cancer of sigmoid colon (HCC) 11/11/2017   Partial sigmoid colon resection, chemo + rad tx's.    Diverticulosis    Genetic testing 12/14/2017   Common Cancers panel (47 genes) @ [facility] - No pathogenic mutations detected   Headache    Heart murmur    Hyperlipidemia    Hypothyroidism    Thyroid disease     Past Surgical History:  Procedure Laterality Date   APPENDECTOMY     possible during hysterectomy   BOWEL RESECTION N/A 11/11/2017   Procedure: SMALL BOWEL RESECTION;  Surgeon: Leafy Ro, MD;  Location: [facility] ORS;  Service: General;  Laterality: N/A;   COLONOSCOPY WITH PROPOFOL N/A 11/10/2018   Procedure: COLONOSCOPY WITH PROPOFOL;  Surgeon: Wyline Mood, MD;  Location: [facility] ENDOSCOPY;  Service: Gastroenterology;  Laterality: N/A;   FLEXIBLE SIGMOIDOSCOPY N/A 11/07/2017   Procedure: FLEXIBLE SIGMOIDOSCOPY;  Surgeon: Wyline Mood, MD;  Location: [facility] ENDOSCOPY;  Service: Gastroenterology;  Laterality: N/A;   LAPAROSCOPIC SIGMOID COLECTOMY N/A 11/11/2017   Procedure: LAPAROSCOPIC SIGMOID COLECTOMY;  Surgeon: Leafy Ro, MD;  Location: [facility] ORS;  Service: General;  Laterality: N/A;   MOUTH SURGERY     PORTACATH PLACEMENT Right 11/29/2017   Procedure: INSERTION PORT-A-CATH;  Surgeon: Leafy Ro, MD;  Location: [facility];  Service: General;  Laterality: Right;  May do local and IV sedation. Please have U/S   TOTAL ABDOMINAL HYSTERECTOMY  05/20/2014   ooperectomy unilateral    Prior to Admission medications   Medication Sig Start Date End Date Taking? Authorizing Provider  amLODipine  (NORVASC) 5 MG tablet Take 1 tablet (5 mg total) by mouth daily. 01/17/23  Yes Karamalegos, Netta Neat, DO  Cyanocobalamin (VITAMIN B 12 PO) Take 2 Doses by mouth daily.    Yes [provider]  levothyroxine (SYNTHROID) 175 MCG tablet Take 1 tablet (175 mcg total) by mouth daily before breakfast. 01/18/23  Yes Karamalegos, Alexander J, DO  OMEGA-3 FATTY ACIDS PO Take 2 capsules by mouth daily.    Yes [provider]    Allergies as of 04/13/2023   (No Known Allergies)    Family History  Problem Relation Age of Onset   Breast cancer Mother 66       currently 29   Hypertension Mother    Thyroid disease Brother    Hernia Brother    Thyroid cancer Maternal Grandmother 84       deceased 69   Stomach cancer Paternal Grandfather        unconfirmed; deceased 61   Stomach cancer Paternal Aunt 40       deceased 41   Colon cancer Maternal Aunt 48       also lung cancer   Stomach cancer Maternal Uncle 64    Social History   Socioeconomic History   Marital status: Married    Spouse name: Not on file   Number of children: Not on file   Years of education: Not on file   Highest education level: Not on file  Occupational History   Not on file  Tobacco Use   Smoking  status: Never   Smokeless tobacco: Never  Vaping Use   Vaping Use: Never used  Substance and Sexual Activity   Alcohol use: No    Alcohol/week: 0.0 standard drinks of alcohol   Drug use: No   Sexual activity: Yes  Other Topics Concern   Not on file  Social History Narrative   Not on file   Social Determinants of Health   Financial Resource Strain: Not on file  Food Insecurity: Not on file  Transportation Needs: Not on file  Physical Activity: Not on file  Stress: Not on file  Social Connections: Not on file  Intimate Partner Violence: Not on file    Review of Systems: See HPI, otherwise negative ROS  Physical Exam: BP (!) 155/75   Pulse 83   Temp (!) 96.5 F (35.8 C) (Temporal)    Resp 18   Ht 5\' 7"  (1.702 m)   Wt 95 kg   SpO2 100%   BMI 32.80 kg/m  General:   Alert,  pleasant and cooperative in NAD Head:  Normocephalic and atraumatic. Neck:  Supple; no masses or thyromegaly. Lungs:  Clear throughout to auscultation, normal respiratory effort.    Heart:  +S1, +S2, Regular rate and rhythm, No edema. Abdomen:  Soft, nontender and nondistended. Normal bowel sounds, without guarding, and without rebound.   Neurologic:  Alert and  oriented x4;  grossly normal neurologically.  Impression/Plan: Regina Patel is here for an colonoscopy to be performed for surveillance due to prior history of colon cancer  Risks, benefits, limitations, and alternatives regarding  colonoscopy have been reviewed with the patient.  Questions have been answered.  All parties agreeable.   Wyline Mood, MD  05/04/2023, 12:41 PM

## 2023-05-04 NOTE — Anesthesia Procedure Notes (Signed)
Date/Time: 05/04/2023 12:45 PM  Performed by: Ginger Carne, CRNAPre-anesthesia Checklist: Patient identified, Emergency Drugs available, Suction available, Patient being monitored and Timeout performed Patient Re-evaluated:Patient Re-evaluated prior to induction Oxygen Delivery Method: Nasal cannula Preoxygenation: Pre-oxygenation with 100% oxygen

## 2023-05-04 NOTE — Op Note (Signed)
[facility] Gastroenterology Patient Name: Regina Patel Procedure Date: 05/04/2023 12:32 PM MRN: [id number] Account #: [id number] Date of Birth: [birth year] Admit Type: Outpatient Age: 52 Room: [facility] ENDO ROOM 3 Gender: Female Note Status: Finalized Instrument Name: Prentice Docker [id number] Procedure:             Colonoscopy Indications:           High risk colon cancer surveillance: Personal history                         of colon cancer, Last colonoscopy: November 2019 Providers:             Wyline Mood MD, MD Medicines:             Monitored Anesthesia Care Complications:         No immediate complications. Procedure:             Pre-Anesthesia Assessment:                        - Prior to the procedure, a History and Physical was                         performed, and patient medications, allergies and                         sensitivities were reviewed. The patient's tolerance                         of previous anesthesia was reviewed.                        - The risks and benefits of the procedure and the                         sedation options and risks were discussed with the                         patient. All questions were answered and informed                         consent was obtained.                        - ASA Grade Assessment: II - A patient with mild                         systemic disease.                        After obtaining informed consent, the colonoscope was                         passed under direct vision. Throughout the procedure,                         the patient's blood pressure, pulse, and oxygen                         saturations were monitored continuously. The  Colonoscope was introduced through the anus and                         advanced to the the cecum, identified by the                         appendiceal orifice. The colonoscopy was performed                         with ease. The patient  tolerated the procedure well.                         The quality of the bowel preparation was excellent.                         The ileocecal valve, appendiceal orifice, and rectum                         were photographed. Findings:      The perianal and digital rectal examinations were normal.      An 8 mm polyp was found in the cecum. The polyp was sessile. The polyp       was removed with a cold snare. Resection and retrieval were complete.      Two sessile polyps were found in the ascending colon. The polyps were 5       to 6 mm in size. These polyps were removed with a cold snare. Resection       and retrieval were complete.      A 6 mm polyp was found in the descending colon. The polyp was sessile.       The polyp was removed with a cold snare. Resection and retrieval were       complete. To prevent bleeding after the polypectomy, one hemostatic clip       was successfully placed. Clip manufacturer: [employer]. There was       no bleeding during the procedure.      There was evidence of a prior end-to-end colo-colonic anastomosis in the       sigmoid colon. This was patent and was characterized by healthy       appearing mucosa.      The exam was otherwise without abnormality on direct and retroflexion       views. Impression:            - One 8 mm polyp in the cecum, removed with a cold                         snare. Resected and retrieved.                        - Two 5 to 6 mm polyps in the ascending colon, removed                         with a cold snare. Resected and retrieved.                        - One 6 mm polyp in the descending colon, removed with  a cold snare. Resected and retrieved. Clip was placed.                         Clip manufacturer: [employer].                        - Patent end-to-end colo-colonic anastomosis,                         characterized by healthy appearing mucosa.                        - The  examination was otherwise normal on direct and                         retroflexion views. Recommendation:        - Discharge patient to home (with escort).                        - Resume previous diet.                        - Continue present medications.                        - Await pathology results.                        - Repeat colonoscopy in 3 years for surveillance based                         on pathology results. Procedure Code(s):     --- Professional ---                        [phone], Colonoscopy, flexible; with removal of                         tumor(s), polyp(s), or other lesion(s) by snare                         technique Diagnosis Code(s):     --- Professional ---                        U04.540, Personal history of other malignant neoplasm                         of large intestine                        D12.0, Benign neoplasm of cecum                        D12.4, Benign neoplasm of descending colon                        D12.2, Benign neoplasm of ascending colon                        Z98.0, Intestinal bypass and anastomosis status CPT copyright 2022 American Medical Association. All rights reserved. The codes documented in this report are preliminary  and upon coder review may  be revised to meet current compliance requirements. Wyline Mood, MD Wyline Mood MD, MD 05/04/2023 1:05:29 PM This report has been signed electronically. Number of Addenda: 0 Note Initiated On: 05/04/2023 12:32 PM Scope Withdrawal Time: 0 hours 13 minutes 5 seconds  Total Procedure Duration: 0 hours 14 minutes 3 seconds  Estimated Blood Loss:  Estimated blood loss: none.      [facility]

## 2023-05-04 NOTE — Anesthesia Postprocedure Evaluation (Signed)
Anesthesia Post Note  Patient: Regina Patel  Procedure(s) Performed: COLONOSCOPY WITH PROPOFOL  Patient location during evaluation: Endoscopy Anesthesia Type: General Level of consciousness: awake and alert Pain management: pain level controlled Vital Signs Assessment: post-procedure vital signs reviewed and stable Respiratory status: spontaneous breathing, nonlabored ventilation and respiratory function stable Cardiovascular status: blood pressure returned to baseline and stable Postop Assessment: no apparent nausea or vomiting Anesthetic complications: no   No notable events documented.   Last Vitals:  Vitals:   05/04/23 1305 05/04/23 1315  BP: (!) 107/57 127/76  Pulse: 79 80  Resp: 20 18  Temp: 37 C   SpO2: 97%     Last Pain:  Vitals:   05/04/23 1315  TempSrc:   PainSc: 0-No pain                 Foye Deer

## 2023-05-04 NOTE — Anesthesia Preprocedure Evaluation (Addendum)
Anesthesia Evaluation  Patient identified by MRN, date of birth, ID band Patient awake    Reviewed: Allergy & Precautions, H&P , NPO status , Patient's Chart, lab work & pertinent test results  Airway Mallampati: II  TM Distance: >3 FB Neck ROM: full    Dental no notable dental hx.    Pulmonary neg pulmonary ROS   Pulmonary exam normal        Cardiovascular hypertension, Normal cardiovascular exam     Neuro/Psych negative neurological ROS  negative psych ROS   GI/Hepatic negative GI ROS, Neg liver ROS,,,  Endo/Other  Hypothyroidism    Renal/GU negative Renal ROS  negative genitourinary   Musculoskeletal   Abdominal  (+) + obese  Peds  Hematology negative hematology ROS (+)   Anesthesia Other Findings Past Medical History: No date: Anemia 11/11/2017: Cancer of sigmoid colon (HCC)     Comment:  Partial sigmoid colon resection, chemo + rad tx's.  No date: Diverticulosis 12/14/2017: Genetic testing     Comment:  Common Cancers panel (47 genes) @ [facility] - No               pathogenic mutations detected No date: Headache No date: Heart murmur No date: Hyperlipidemia No date: Hypothyroidism No date: Thyroid disease  Past Surgical History: No date: APPENDECTOMY     Comment:  possible during hysterectomy 11/11/2017: BOWEL RESECTION; N/A     Comment:  Procedure: SMALL BOWEL RESECTION;  Surgeon: Leafy Ro, MD;  Location: [facility] ORS;  Service: General;                Laterality: N/A; 11/10/2018: COLONOSCOPY WITH PROPOFOL; N/A     Comment:  Procedure: COLONOSCOPY WITH PROPOFOL;  Surgeon: Wyline Mood, MD;  Location: [facility] ENDOSCOPY;  Service:               Gastroenterology;  Laterality: N/A; 11/07/2017: FLEXIBLE SIGMOIDOSCOPY; N/A     Comment:  Procedure: FLEXIBLE SIGMOIDOSCOPY;  Surgeon: Wyline Mood, MD;  Location: [facility] ENDOSCOPY;  Service:               Gastroenterology;   Laterality: N/A; 11/11/2017: LAPAROSCOPIC SIGMOID COLECTOMY; N/A     Comment:  Procedure: LAPAROSCOPIC SIGMOID COLECTOMY;  Surgeon:               Leafy Ro, MD;  Location: [facility] ORS;  Service:               General;  Laterality: N/A; No date: MOUTH SURGERY 11/29/2017: PORTACATH PLACEMENT; Right     Comment:  Procedure: INSERTION PORT-A-CATH;  Surgeon: Leafy Ro, MD;  Location: [facility];  Service: General;              Laterality: Right;  May do local and IV sedation. Please               have U/S 05/20/2014: TOTAL ABDOMINAL HYSTERECTOMY     Comment:  ooperectomy unilateral     Reproductive/Obstetrics negative OB ROS                             Anesthesia Physical Anesthesia Plan  ASA: 2  Anesthesia Plan: General   Post-op Pain Management:    Induction:   PONV Risk Score and Plan: Propofol infusion and TIVA  Airway Management Planned:   Additional Equipment:   Intra-op Plan:   Post-operative Plan:   Informed Consent: I have reviewed the patients History and Physical, chart, labs and discussed the procedure including the risks, benefits and alternatives for the proposed anesthesia with the patient or authorized representative who has indicated his/her understanding and acceptance.     Dental Advisory Given  Plan Discussed with: CRNA and Surgeon  Anesthesia Plan Comments:         Anesthesia Quick Evaluation

## 2023-05-05 ENCOUNTER — Emergency Department: Payer: 59

## 2023-05-05 ENCOUNTER — Telehealth: Payer: Self-pay

## 2023-05-05 ENCOUNTER — Encounter: Payer: Self-pay | Admitting: Gastroenterology

## 2023-05-05 ENCOUNTER — Other Ambulatory Visit: Payer: Self-pay

## 2023-05-05 ENCOUNTER — Ambulatory Visit
Admission: RE | Admit: 2023-05-05 | Discharge: 2023-05-05 | Disposition: A | Discharge status: Home / Self Care | Payer: 59 | Source: Home / Self Care | Attending: Gastroenterology | Admitting: Gastroenterology

## 2023-05-05 ENCOUNTER — Emergency Department
Admission: EM | Admit: 2023-05-05 | Discharge: 2023-05-05 | Disposition: A | Discharge status: Home / Self Care | Payer: 59 | Attending: Emergency Medicine | Admitting: Emergency Medicine

## 2023-05-05 ENCOUNTER — Encounter: Payer: Self-pay | Admitting: Oncology

## 2023-05-05 DIAGNOSIS — R1011 Right upper quadrant pain: Secondary | ICD-10-CM

## 2023-05-05 DIAGNOSIS — E039 Hypothyroidism, unspecified: Secondary | ICD-10-CM | POA: Insufficient documentation

## 2023-05-05 DIAGNOSIS — K828 Other specified diseases of gallbladder: Secondary | ICD-10-CM | POA: Diagnosis not present

## 2023-05-05 DIAGNOSIS — R100 Acute abdomen: Secondary | ICD-10-CM | POA: Diagnosis not present

## 2023-05-05 DIAGNOSIS — R1084 Generalized abdominal pain: Secondary | ICD-10-CM

## 2023-05-05 DIAGNOSIS — Z1211 Encounter for screening for malignant neoplasm of colon: Secondary | ICD-10-CM | POA: Diagnosis not present

## 2023-05-05 DIAGNOSIS — R112 Nausea with vomiting, unspecified: Secondary | ICD-10-CM

## 2023-05-05 LAB — CBC
HCT: 41 % (ref 36.0–46.0)
Hemoglobin: 13.5 g/dL (ref 12.0–15.0)
MCH: 28.9 pg (ref 26.0–34.0)
MCHC: 32.9 g/dL (ref 30.0–36.0)
MCV: 87.8 fL (ref 80.0–100.0)
Platelets: 221 10*3/uL (ref 150–400)
RBC: 4.67 MIL/uL (ref 3.87–5.11)
RDW: 13.8 % (ref 11.5–15.5)
WBC: 7 10*3/uL (ref 4.0–10.5)
nRBC: 0 % (ref 0.0–0.2)

## 2023-05-05 LAB — COMPREHENSIVE METABOLIC PANEL
ALT: 33 U/L (ref 0–44)
AST: 25 U/L (ref 15–41)
Albumin: 4.2 g/dL (ref 3.5–5.0)
Alkaline Phosphatase: 76 U/L (ref 38–126)
Anion gap: 10 (ref 5–15)
BUN: 9 mg/dL (ref 6–20)
CO2: 23 mmol/L (ref 22–32)
Calcium: 9.6 mg/dL (ref 8.9–10.3)
Chloride: 103 mmol/L (ref 98–111)
Creatinine, Ser: 0.66 mg/dL (ref 0.44–1.00)
GFR, Estimated: >60 mL/min (ref >60)
Glucose, Bld: 102 mg/dL — ABNORMAL HIGH (ref 70–99)
Potassium: 3.8 mmol/L (ref 3.5–5.1)
Sodium: 136 mmol/L (ref 135–145)
Total Bilirubin: 0.8 mg/dL (ref 0.3–1.2)
Total Protein: 7.4 g/dL (ref 6.5–8.1)

## 2023-05-05 LAB — SURGICAL PATHOLOGY

## 2023-05-05 LAB — LIPASE, BLOOD: Lipase: 29 U/L (ref 11–51)

## 2023-05-05 MED ORDER — IOHEXOL 9 MG/ML PO SOLN
500.0000 mL | ORAL | Status: AC
  Administered 2023-05-05 (×2): 500 mL via ORAL

## 2023-05-05 MED ORDER — IOHEXOL 300 MG/ML  SOLN
100.0000 mL | Freq: Once | INTRAMUSCULAR | Status: AC | PRN
  Administered 2023-05-05: 100 mL via INTRAVENOUS

## 2023-05-05 MED ORDER — ONDANSETRON HCL 4 MG PO TABS: 4.0000 mg | ORAL_TABLET | Freq: Every day | ORAL | 1 refills | Status: DC | PRN

## 2023-05-05 NOTE — Discharge Instructions (Signed)
Dr. Elenor Legato will schedule an appointment for you as an outpatient in the next couple of weeks.  If experience any new, worsening, or unexpected symptoms like severe pain high fever uncontrolled vomiting come back to the emergency department for reevaluation.  Take Zofran as needed for nausea.

## 2023-05-05 NOTE — Consult Note (Signed)
Patient ID: Regina Patel, female   DOB: [birth year], 52 y.o.   MRN: [id number]  HPI Regina Patel is a 52 y.o. female seen in consultation at the request of Dr. Modesto Charon acute onset of abdominal pain.  Patient did have a uneventful colonoscopy with polypectomy by Dr. Tobi Bastos.  Please note that I have personally reviewed endoscopic images.  She was told to come into the emergency room due to persistent pain this morning.  The patient reports that the pain is nonspecific has been intermittent and located in the upper abdomen.  Seems to be dull and moderate in intensity.  She did have associated nausea and vomiting.  She denies any fevers and chills.  Of note I did a laparoscopic sigmoid colectomy with small bowel resection for cancer 5 years ago.  She received adjuvant chemoradiation therapy and did very well.  She is cancer free currently.  She did have a CT scan that I have personally reviewed showing some gallstones but no evidence definitively of cholecystitis.  There is no evidence of perforation.  There is no evidence of free air there is no evidence of metastatic disease. CBC and CMP is unremarkable.  She came to the emergency room she was appropriately resuscitated with IV fluids and pain control.  She now is completely pain-free and wishes to go home I discussed the case in detail with Dr. Modesto Charon   HPI  Past Medical History:  Diagnosis Date   Anemia    Cancer of sigmoid colon (HCC) 11/11/2017   Partial sigmoid colon resection, chemo + rad tx's.    Diverticulosis    Genetic testing 12/14/2017   Common Cancers panel (47 genes) @ [facility] - No pathogenic mutations detected   Headache    Heart murmur    Hyperlipidemia    Hypothyroidism    Thyroid disease     Past Surgical History:  Procedure Laterality Date   APPENDECTOMY     possible during hysterectomy   BOWEL RESECTION N/A 11/11/2017   Procedure: SMALL BOWEL RESECTION;  Surgeon: Leafy Ro, MD;  Location: [facility] ORS;  Service: General;   Laterality: N/A;   COLONOSCOPY WITH PROPOFOL N/A 11/10/2018   Procedure: COLONOSCOPY WITH PROPOFOL;  Surgeon: Wyline Mood, MD;  Location: [facility] ENDOSCOPY;  Service: Gastroenterology;  Laterality: N/A;   COLONOSCOPY WITH PROPOFOL N/A 05/04/2023   Procedure: COLONOSCOPY WITH PROPOFOL;  Surgeon: Wyline Mood, MD;  Location: [facility] ENDOSCOPY;  Service: Gastroenterology;  Laterality: N/A;   FLEXIBLE SIGMOIDOSCOPY N/A 11/07/2017   Procedure: FLEXIBLE SIGMOIDOSCOPY;  Surgeon: Wyline Mood, MD;  Location: [facility] ENDOSCOPY;  Service: Gastroenterology;  Laterality: N/A;   LAPAROSCOPIC SIGMOID COLECTOMY N/A 11/11/2017   Procedure: LAPAROSCOPIC SIGMOID COLECTOMY;  Surgeon: Leafy Ro, MD;  Location: [facility] ORS;  Service: General;  Laterality: N/A;   MOUTH SURGERY     PORTACATH PLACEMENT Right 11/29/2017   Procedure: INSERTION PORT-A-CATH;  Surgeon: Leafy Ro, MD;  Location: [facility];  Service: General;  Laterality: Right;  May do local and IV sedation. Please have U/S   TOTAL ABDOMINAL HYSTERECTOMY  05/20/2014   ooperectomy unilateral    Family History  Problem Relation Age of Onset   Breast cancer Mother 27       currently 41   Hypertension Mother    Thyroid disease Brother    Hernia Brother    Thyroid cancer Maternal Grandmother 85       deceased 2   Stomach cancer Paternal Grandfather        unconfirmed;  deceased 54   Stomach cancer Paternal Aunt 86       deceased 20   Colon cancer Maternal Aunt 68       also lung cancer   Stomach cancer Maternal Uncle 30    Social History Social History   Tobacco Use   Smoking status: Never   Smokeless tobacco: Never  Vaping Use   Vaping Use: Never used  Substance Use Topics   Alcohol use: No    Alcohol/week: 0.0 standard drinks of alcohol   Drug use: No    No Known Allergies  No current facility-administered medications for this encounter.   Current Outpatient Medications  Medication Sig Dispense Refill   amLODipine (NORVASC)  5 MG tablet Take 1 tablet (5 mg total) by mouth daily. 90 tablet 3   Cyanocobalamin (VITAMIN B 12 PO) Take 2 Doses by mouth daily.      levothyroxine (SYNTHROID) 175 MCG tablet Take 1 tablet (175 mcg total) by mouth daily before breakfast. 90 tablet 3   OMEGA-3 FATTY ACIDS PO Take 2 capsules by mouth daily.      Facility-Administered Medications Ordered in Other Encounters  Medication Dose Route Frequency Provider Last Rate Last Admin   heparin lock flush 100 unit/mL  500 Units Intracatheter PRN Creig Hines, MD       sodium chloride flush (NS) 0.9 % injection 10 mL  10 mL Intravenous PRN Creig Hines, MD   10 mL at 06/13/18 0844     Review of Systems Full ROS  was asked and was negative except for the information on the HPI  Physical Exam Blood pressure 132/84, pulse 92, temperature 98.6 F (37 C), temperature source Oral, resp. rate 18, SpO2 97 %. CONSTITUTIONAL: NAD. EYES: Pupils are equal, round, Sclera are non-icteric. EARS, NOSE, MOUTH AND THROAT:  The oral mucosa is pink and moist. Hearing is intact to voice. LYMPH NODES:  Lymph nodes in the neck are normal. RESPIRATORY:  Lungs are clear. There is normal respiratory effort, with equal breath sounds bilaterally, and without pathologic use of accessory muscles. CARDIOVASCULAR: Heart is regular without murmurs, gallops, or rubs. GI: The abdomen is  soft, nontender, and nondistended. There are no palpable masses. There is no hepatosplenomegaly. There are normal bowel sounds in all quadrants. GU: Rectal deferred.   MUSCULOSKELETAL: Normal muscle strength and tone. No cyanosis or edema.   SKIN: Turgor is good and there are no pathologic skin lesions or ulcers. NEUROLOGIC: Motor and sensation is grossly normal. Cranial nerves are grossly intact. PSYCH:  Oriented to person, place and time. Affect is normal.  Data Reviewed  I have personally reviewed the patient's imaging, laboratory findings and medical records.     Assessment/Plan 52 year old female with acute onset of abdominal pain nausea and vomiting after recent colonoscopy.  Clinical findings not consistent with perforation.  Likely this is episode of biliary colic that has subsided.  At this point no clinical evidence of acute cholecystitis.  Gust with her and ER provider in detail about my thought process.  I do think that she can be discharged home safely with close follow-up by me.  No need for any emergent surgical intervention at this time I Spent 55 minutes in this encounter including personally reviewing imaging studies, reviewing medical records, placing orders, coordinating her care and performing appropriate documentation  Sterling Big, MD FACS General Surgeon 05/05/2023, 3:46 PM

## 2023-05-05 NOTE — Progress Notes (Signed)
I have sent the patient to the emergency room for possible cholecystitis.  She presented with abdominal pain after her colonoscopy initially concerned about perforation but CT scan shows no perforation.  Apparently she has had some abdominal pains in the past on and off after meals.  FYI  Kind regards  Sharlet Salina

## 2023-05-05 NOTE — Progress Notes (Signed)
Dr. Tobi Bastos was notified verbally that during patient call back,  Ms. Werman stated she had abdominal pain and pressure during the night, also nausea, vomiting and that she was planning to call the office this morning.

## 2023-05-05 NOTE — ED Triage Notes (Signed)
Patient to ED from outpatient CT. Patient had colonoscopy yesterday with abd pain  and N/V starting last PM. Sent today for CT that showed abnormal findings and was told to come to ED.

## 2023-05-05 NOTE — ED Provider Notes (Addendum)
[facility] Provider Note    Event Date/Time   First MD Initiated Contact with Patient 05/05/23 1229     (approximate)   History   Abdominal Pain   HPI  Regina Patel is a 52 y.o. female   Past medical history of hypothyroid, hyperlipidemia, recent colonoscopy yesterday who presents to the emergency department with right upper quadrant postprandial pain associate with nausea and vomiting that happened yesterday.  She had an outpatient CT scan that showed wall thickening of the gallbladder and was sent to the emergency department for further evaluation.  Currently she has no symptoms at all feels well.      External Medical Documents Reviewed: CT scan from outpatient earlier today with wall thickening of the gallbladder.      Physical Exam   Triage Vital Signs: ED Triage Vitals [05/05/23 1154]  Enc Vitals Group     BP 132/84     Pulse Rate 92     Resp 18     Temp 98.6 F (37 C)     Temp Source Oral     SpO2 97 %     Weight      Height      Head Circumference      Peak Flow      Pain Score 3     Pain Loc      Pain Edu?      Excl. in GC?     Most recent vital signs: Vitals:   05/05/23 1154  BP: 132/84  Pulse: 92  Resp: 18  Temp: 98.6 F (37 C)  SpO2: 97%    General: Awake, no distress.  CV:  Good peripheral perfusion.  Resp:  Normal effort.  Abd:  No distention.  Other:  Wake comfortable nontoxic normal vital signs and afebrile.  Soft nontender abdomen to deep palpation all quadrants.   ED Results / Procedures / Treatments   Labs (all labs ordered are listed, but only abnormal results are displayed) Labs Reviewed  COMPREHENSIVE METABOLIC PANEL - Abnormal; Notable for the following components:      Result Value   Glucose, Bld 102 (*)    All other components within normal limits  LIPASE, BLOOD  CBC  URINALYSIS, ROUTINE W REFLEX MICROSCOPIC     I ordered and reviewed the above labs they are notable for normal white  blood cell count and LFTs.   RADIOLOGY I independently reviewed and interpreted ultrasound of the gallbladder and see some sludge layering on the bottom   PROCEDURES:  Critical Care performed: No  Procedures   MEDICATIONS ORDERED IN ED: Medications - No data to display  External physician / consultants:  I spoke with Dr. Tonna Boehringer of general surgery regarding care plan for this patient.   IMPRESSION / MDM / ASSESSMENT AND PLAN / ED COURSE  I reviewed the triage vital signs and the nursing notes.                                Patient's presentation is most consistent with acute presentation with potential threat to life or bodily function.  Differential diagnosis includes, but is not limited to, acute cholecystitis, cholelithiasis, pancreatitis, intra-abdominal infection or obstruction   The patient is on the cardiac monitor to evaluate for evidence of arrhythmia and/or significant heart rate changes.  MDM: Wall thickening on gallbladder with postprandial abdominal pain and nausea vomiting yesterday.  She feels well  now appears nontoxic with normal labs and vital signs.  However her gallbladder ultrasound does show some questionable signs of cholecystitis with gallbladder sludge and wall thickening.  I consulted Dr. Tonna Boehringer who will evaluate patient and give further recommendations and disposition.  Deferring antibiotics at this time given her well appearance normal white blood cell count afebrile no pain until further guidance from Dr. Tonna Boehringer of general surgery.   --- Dr Everlene Farrier evaluated this patient, cleared for discharge and will follow-up as an outpatient.  Does not be consistent with a cholecystitis at this time, no antibiotics per surgery recommendations.  Patient feels well.  Discharged with return precautions.   FINAL CLINICAL IMPRESSION(S) / ED DIAGNOSES   Final diagnoses:  Postprandial RUQ pain  Gallbladder sludge     Rx / DC Orders   ED Discharge Orders      None        Note:  This document was prepared using Dragon voice recognition software and may include unintentional dictation errors.    Pilar Jarvis, MD 05/05/23 1440    Pilar Jarvis, MD 05/05/23 [phone]

## 2023-05-05 NOTE — Progress Notes (Deleted)
States that she was planning to call Dr. [name] office when they open.

## 2023-05-05 NOTE — Telephone Encounter (Signed)
Dr. Tobi Bastos got in contact with me to call patient since she had called the endoscopy unit to let them know that she's been having abdominal pain. Dr. Tobi Bastos wanted me to call patient to ask how she was feeling. I then called patient and she stated that she was not able to sleep last night at all because ever since she left the endoscopy unit after her colonoscopy, she started to have abdominal pain, nausea, vomiting, mid back pain. Patient stated that she feels the same as when she was told that she had colon cancer in the past. I told her that I would call her as soon as I mentioned it to Dr. Tobi Bastos. Then Dr. Tobi Bastos wanted me to order a CT Scan of her abdomen to make sure that she does not have a perforation in her colon. CT Scan was ordered, scheduled and patient had been notified.

## 2023-05-10 ENCOUNTER — Telehealth: Payer: Self-pay

## 2023-05-10 NOTE — Transitions of Care (Post Inpatient/ED Visit) (Signed)
   05/10/2023  Name: Regina Patel MRN: [id number] DOB: 1971/05/19  Today's TOC FU Call Status: Today's TOC FU Call Status:: Unsuccessul Call (1st Attempt) Unsuccessful Call (1st Attempt) Date: 05/10/23  Attempted to reach the patient regarding the most recent Inpatient/ED visit.  Follow Up Plan: No further outreach attempts will be made at this time. We have been unable to contact the patient.  Oneal Grout, [facility]) [facility] [phone]

## 2023-05-21 DIAGNOSIS — K8020 Calculus of gallbladder without cholecystitis without obstruction: Secondary | ICD-10-CM

## 2023-05-21 HISTORY — DX: Calculus of gallbladder without cholecystitis without obstruction: K80.20

## 2023-05-23 ENCOUNTER — Encounter: Payer: Self-pay | Admitting: Surgery

## 2023-05-23 ENCOUNTER — Ambulatory Visit: Payer: 59 | Admitting: Surgery

## 2023-05-23 VITALS — BP 154/89 | HR 87 | Temp 98.2°F | Ht 67.0 in | Wt 212.6 lb

## 2023-05-23 DIAGNOSIS — K8020 Calculus of gallbladder without cholecystitis without obstruction: Secondary | ICD-10-CM

## 2023-05-23 NOTE — Progress Notes (Signed)
Surgical Consultation  05/23/2023  Regina Patel is an 52 y.o. female.   Chief Complaint  Patient presents with   New Patient (Initial Visit)    Gallstones     HPI: Regina Patel is a 52 y.o. female seen after recent visit to the ER  .  Patient did have a uneventful colonoscopy with polypectomy by Dr. Tobi Bastos.  Please note that I have personally reviewed endoscopic images.  She developed abd pain after colonoscopy and went to the ER. The patient reports that the pain was nonspecific has been intermittent and located in the upper abdomen.  Seems to be dull and moderate in intensity.  She did have associated nausea and vomiting.  She denies any fevers and chills.  Of note I did a laparoscopic sigmoid colectomy with small bowel resection for cancer 5 years ago.  She received adjuvant chemoradiation therapy and did very well.  She is cancer free currently.  She did have a CT scan that I have personally reviewed showing some gallstones but no evidence definitively of cholecystitis.  There is no evidence of perforation.  There is no evidence of free air there is no evidence of metastatic disease. CBC and CMP is unremarkable.  She came to the emergency room she was appropriately resuscitated with IV fluids and pain control.  She now is completely pain-free and wishes to go home. Able to perform more than 4 METS of activity without any shortness of breath or chest pain.  She works as a Occupational psychologist  Past Medical History:  Diagnosis Date   Anemia    Cancer of sigmoid colon (HCC) 11/11/2017   Partial sigmoid colon resection, chemo + rad tx's.    Diverticulosis    Genetic testing 12/14/2017   Common Cancers panel (47 genes) @ [facility] - No pathogenic mutations detected   Headache    Heart murmur    Hyperlipidemia    Hypothyroidism    Thyroid disease     Past Surgical History:  Procedure Laterality Date   APPENDECTOMY     possible during hysterectomy   BOWEL RESECTION N/A 11/11/2017    Procedure: SMALL BOWEL RESECTION;  Surgeon: Leafy Ro, MD;  Location: [facility] ORS;  Service: General;  Laterality: N/A;   COLONOSCOPY WITH PROPOFOL N/A 11/10/2018   Procedure: COLONOSCOPY WITH PROPOFOL;  Surgeon: Wyline Mood, MD;  Location: [facility] ENDOSCOPY;  Service: Gastroenterology;  Laterality: N/A;   COLONOSCOPY WITH PROPOFOL N/A 05/04/2023   Procedure: COLONOSCOPY WITH PROPOFOL;  Surgeon: Wyline Mood, MD;  Location: [facility] ENDOSCOPY;  Service: Gastroenterology;  Laterality: N/A;   FLEXIBLE SIGMOIDOSCOPY N/A 11/07/2017   Procedure: FLEXIBLE SIGMOIDOSCOPY;  Surgeon: Wyline Mood, MD;  Location: [facility] ENDOSCOPY;  Service: Gastroenterology;  Laterality: N/A;   LAPAROSCOPIC SIGMOID COLECTOMY N/A 11/11/2017   Procedure: LAPAROSCOPIC SIGMOID COLECTOMY;  Surgeon: Leafy Ro, MD;  Location: [facility] ORS;  Service: General;  Laterality: N/A;   MOUTH SURGERY     PORTACATH PLACEMENT Right 11/29/2017   Procedure: INSERTION PORT-A-CATH;  Surgeon: Leafy Ro, MD;  Location: [facility];  Service: General;  Laterality: Right;  May do local and IV sedation. Please have U/S   TOTAL ABDOMINAL HYSTERECTOMY  05/20/2014   ooperectomy unilateral    Family History  Problem Relation Age of Onset   Breast cancer Mother 23       currently 33   Hypertension Mother    Thyroid disease Brother    Hernia Brother    Thyroid cancer Maternal Grandmother 67  deceased 21   Stomach cancer Paternal Grandfather        unconfirmed; deceased 81   Stomach cancer Paternal Aunt 52       deceased 82   Colon cancer Maternal Aunt 2       also lung cancer   Stomach cancer Maternal Uncle 78    Social History:  reports that she has never smoked. She has never used smokeless tobacco. She reports that she does not drink alcohol and does not use drugs.  Allergies: No Known Allergies  Medications reviewed.     ROS Full ROS performed and is otherwise negative other than what is stated in the HPI    BP  (!) 154/89   Pulse 87   Temp 98.2 F (36.8 C) (Oral)   Ht 5\' 7"  (1.702 m)   Wt 212 lb 9.6 oz (96.4 kg)   SpO2 99%   BMI 33.30 kg/m   Physical Exam CONSTITUTIONAL: NAD. EYES: Pupils are equal, round, Sclera are non-icteric. EARS, NOSE, MOUTH AND THROAT:  The oral mucosa is pink and moist. Hearing is intact to voice. LYMPH NODES:  Lymph nodes in the neck are normal. RESPIRATORY:  Lungs are clear. There is normal respiratory effort, with equal breath sounds bilaterally, and without pathologic use of accessory muscles. CARDIOVASCULAR: Heart is regular without murmurs, gallops, or rubs. GI: The abdomen is  soft, nontender, and nondistended. There are no palpable masses. There is no hepatosplenomegaly. There are normal bowel sounds in all quadrants.Minilaparotomy scar healed. GU: Rectal deferred.   MUSCULOSKELETAL: Normal muscle strength and tone. No cyanosis or edema.   SKIN: Turgor is good and there are no pathologic skin lesions or ulcers. NEUROLOGIC: Motor and sensation is grossly normal. Cranial nerves are grossly intact. PSYCH:  Oriented to person, place and time. Affect is normal.    Assessment/Plan: Symptomatic gallstones.  Discussed with patient detail about her disease process.  I do recommend cholecystectomy  The risks, benefits, complications, treatment options, and expected outcomes were discussed with the patient. The possibilities of bleeding, recurrent infection, finding a normal gallbladder, perforation of viscus organs, damage to surrounding structures, bile leak, abscess formation, needing a drain placed, the need for additional procedures, reaction to medication, pulmonary aspiration,  failure to diagnose a condition, the possible need to convert to an open procedure, and creating a complication requiring transfusion or operation were discussed with the patient. The patient and/or family concurred with the proposed plan, giving informed consent.  I Spent 40 minutes in this  encounter including personally reviewing imaging studies, reviewing medical records, placing orders, coordinating her care and performing appropriate documentation .    Sterling Big, MD [facility] General Surgeon

## 2023-05-23 NOTE — H&P (View-Only) (Signed)
Surgical Consultation  05/23/2023  Regina Patel is an 52 y.o. female.   Chief Complaint  Patient presents with   New Patient (Initial Visit)    Gallstones     HPI: Regina Patel is a 52 y.o. female seen after recent visit to the ER  .  Patient did have a uneventful colonoscopy with polypectomy by Dr. Anna.  Please note that I have personally reviewed endoscopic images.  She developed abd pain after colonoscopy and went to the ER. The patient reports that the pain was nonspecific has been intermittent and located in the upper abdomen.  Seems to be dull and moderate in intensity.  She did have associated nausea and vomiting.  She denies any fevers and chills.  Of note I did a laparoscopic sigmoid colectomy with small bowel resection for cancer 5 years ago.  She received adjuvant chemoradiation therapy and did very well.  She is cancer free currently.  She did have a CT scan that I have personally reviewed showing some gallstones but no evidence definitively of cholecystitis.  There is no evidence of perforation.  There is no evidence of free air there is no evidence of metastatic disease. CBC and CMP is unremarkable.  She came to the emergency room she was appropriately resuscitated with IV fluids and pain control.  She now is completely pain-free and wishes to go home. Able to perform more than 4 METS of activity without any shortness of breath or chest pain.  She works as a customer service representative  Past Medical History:  Diagnosis Date   Anemia    Cancer of sigmoid colon (HCC) 11/11/2017   Partial sigmoid colon resection, chemo + rad tx's.    Diverticulosis    Genetic testing 12/14/2017   Common Cancers panel (47 genes) @ [facility] - No pathogenic mutations detected   Headache    Heart murmur    Hyperlipidemia    Hypothyroidism    Thyroid disease     Past Surgical History:  Procedure Laterality Date   APPENDECTOMY     possible during hysterectomy   BOWEL RESECTION N/A 11/11/2017    Procedure: SMALL BOWEL RESECTION;  Surgeon: Shariah Assad F, Patel;  Location: [facility] ORS;  Service: General;  Laterality: N/A;   COLONOSCOPY WITH PROPOFOL N/A 11/10/2018   Procedure: COLONOSCOPY WITH PROPOFOL;  Surgeon: Regina Patel;  Location: [facility] ENDOSCOPY;  Service: Gastroenterology;  Laterality: N/A;   COLONOSCOPY WITH PROPOFOL N/A 05/04/2023   Procedure: COLONOSCOPY WITH PROPOFOL;  Surgeon: Regina Patel;  Location: [facility] ENDOSCOPY;  Service: Gastroenterology;  Laterality: N/A;   FLEXIBLE SIGMOIDOSCOPY N/A 11/07/2017   Procedure: FLEXIBLE SIGMOIDOSCOPY;  Surgeon: Regina Patel;  Location: [facility] ENDOSCOPY;  Service: Gastroenterology;  Laterality: N/A;   LAPAROSCOPIC SIGMOID COLECTOMY N/A 11/11/2017   Procedure: LAPAROSCOPIC SIGMOID COLECTOMY;  Surgeon: Regina Patel;  Location: [facility] ORS;  Service: General;  Laterality: N/A;   MOUTH SURGERY     PORTACATH PLACEMENT Right 11/29/2017   Procedure: INSERTION PORT-A-CATH;  Surgeon: Regina Patel;  Location: [facility];  Service: General;  Laterality: Right;  May do local and IV sedation. Please have U/S   TOTAL ABDOMINAL HYSTERECTOMY  05/20/2014   ooperectomy unilateral    Family History  Problem Relation Age of Onset   Breast cancer Mother 39       currently 72   Hypertension Mother    Thyroid disease Brother    Hernia Brother    Thyroid cancer Maternal Grandmother 52         deceased 60   Stomach cancer Paternal Grandfather        unconfirmed; deceased 83   Stomach cancer Paternal Aunt 62       deceased 63   Colon cancer Maternal Aunt 68       also lung cancer   Stomach cancer Maternal Uncle 70    Social History:  reports that she has never smoked. She has never used smokeless tobacco. She reports that she does not drink alcohol and does not use drugs.  Allergies: No Known Allergies  Medications reviewed.     ROS Full ROS performed and is otherwise negative other than what is stated in the HPI    BP  (!) 154/89   Pulse 87   Temp 98.2 F (36.8 C) (Oral)   Ht 5' 7" (1.702 m)   Wt 212 lb 9.6 oz (96.4 kg)   SpO2 99%   BMI 33.30 kg/m   Physical Exam CONSTITUTIONAL: NAD. EYES: Pupils are equal, round, Sclera are non-icteric. EARS, NOSE, MOUTH AND THROAT:  The oral mucosa is pink and moist. Hearing is intact to voice. LYMPH NODES:  Lymph nodes in the neck are normal. RESPIRATORY:  Lungs are clear. There is normal respiratory effort, with equal breath sounds bilaterally, and without pathologic use of accessory muscles. CARDIOVASCULAR: Heart is regular without murmurs, gallops, or rubs. GI: The abdomen is  soft, nontender, and nondistended. There are no palpable masses. There is no hepatosplenomegaly. There are normal bowel sounds in all quadrants.Minilaparotomy scar healed. GU: Rectal deferred.   MUSCULOSKELETAL: Normal muscle strength and tone. No cyanosis or edema.   SKIN: Turgor is good and there are no pathologic skin lesions or ulcers. NEUROLOGIC: Motor and sensation is grossly normal. Cranial nerves are grossly intact. PSYCH:  Oriented to person, place and time. Affect is normal.    Assessment/Plan: Symptomatic gallstones.  Discussed with patient detail about her disease process.  I do recommend cholecystectomy  The risks, benefits, complications, treatment options, and expected outcomes were discussed with the patient. The possibilities of bleeding, recurrent infection, finding a normal gallbladder, perforation of viscus organs, damage to surrounding structures, bile leak, abscess formation, needing a drain placed, the need for additional procedures, reaction to medication, pulmonary aspiration,  failure to diagnose a condition, the possible need to convert to an open procedure, and creating a complication requiring transfusion or operation were discussed with the patient. The patient and/or family concurred with the proposed plan, giving informed consent.  I Spent 40 minutes in this  encounter including personally reviewing imaging studies, reviewing medical records, placing orders, coordinating her care and performing appropriate documentation .    Regina Lalli, Patel FACS General Surgeon  

## 2023-05-23 NOTE — Patient Instructions (Signed)
Our surgery scheduler Barbara will call you within 24-48 hours to get you scheduled. If you have not heard from her after 48 hours, please call our office. Have the blue sheet available when she calls to write down important information.   If you have any concerns or questions, please feel free to call our office.   Minimally Invasive Cholecystectomy Minimally invasive cholecystectomy is surgery to remove the gallbladder. The gallbladder is a pear-shaped organ that lies beneath the liver on the right side of the body. The gallbladder stores bile, which is a fluid that helps the body digest fats. Cholecystectomy is often done to treat inflammation (irritation and swelling) of the gallbladder (cholecystitis). This condition is usually caused by a buildup of gallstones (cholelithiasis) in the gallbladder or when the fluid in the gall bladder becomes stagnant because gallstones get stuck in the ducts (tubes) and block the flow of bile. This can result in inflammation and pain. In severe cases, emergency surgery may be required. This procedure is done through small incisions in the abdomen, instead of one large incision. It is also called laparoscopic surgery. A thin scope with a camera (laparoscope) is inserted through one incision. Then surgical instruments are inserted through the other incisions. In some cases, a minimally invasive surgery may need to be changed to a surgery that is done through a larger incision. This is called open surgery. Tell a health care provider about: Any allergies you have. All medicines you are taking, including vitamins, herbs, eye drops, creams, and over-the-counter medicines. Any problems you or family members have had with anesthetic medicines. Any bleeding problems you have. Any surgeries you have had. Any medical conditions you have. Whether you are pregnant or may be pregnant. What are the risks? Generally, this is a safe procedure. However, problems may occur,  including: Infection. Bleeding. Allergic reactions to medicines. Damage to nearby structures or organs. A gallstone remaining in the common bile duct. The common bile duct carries bile from the gallbladder to the small intestine. A bile leak from the liver or cystic duct after your gallbladder is removed. What happens before the procedure? When to stop eating and drinking Follow instructions from your health care provider about what you may eat and drink before your procedure. These may include: 8 hours before the procedure Stop eating most foods. Do not eat meat, fried foods, or fatty foods. Eat only light foods, such as toast or crackers. All liquids are okay except energy drinks and alcohol. 6 hours before the procedure Stop eating. Drink only clear liquids, such as water, clear fruit juice, black coffee, plain tea, and sports drinks. Do not drink energy drinks or alcohol. 2 hours before the procedure Stop drinking all liquids. You may be allowed to take medicines with small sips of water. If you do not follow your health care provider's instructions, your procedure may be delayed or canceled. Medicines Ask your health care provider about: Changing or stopping your regular medicines. This is especially important if you are taking diabetes medicines or blood thinners. Taking medicines such as aspirin and ibuprofen. These medicines can thin your blood. Do not take these medicines unless your health care provider tells you to take them. Taking over-the-counter medicines, vitamins, herbs, and supplements. General instructions If you will be going home right after the procedure, plan to have a responsible adult: Take you home from the hospital or clinic. You will not be allowed to drive. Care for you for the time you are told. Do   not use any products that contain nicotine or tobacco for at least 4 weeks before the procedure. These products include cigarettes, chewing tobacco, and vaping  devices, such as e-cigarettes. If you need help quitting, ask your health care provider. Ask your health care provider: How your surgery site will be marked. What steps will be taken to help prevent infection. These may include: Removing hair at the surgery site. Washing skin with a germ-killing soap. Taking antibiotic medicine. What happens during the procedure?  An IV will be inserted into one of your veins. You will be given one or both of the following: A medicine to help you relax (sedative). A medicine to make you fall asleep (general anesthetic). Your surgeon will make several small incisions in your abdomen. The laparoscope will be inserted through one of the small incisions. The camera on the laparoscope will send images to a monitor in the operating room. This lets your surgeon see inside your abdomen. A gas will be pumped into your abdomen. This will expand your abdomen to give the surgeon more room to perform the surgery. Other tools that are needed for the procedure will be inserted through the other incisions. The gallbladder will be removed through one of the incisions. Your common bile duct may be examined. If stones are found in the common bile duct, they may be removed. After your gallbladder has been removed, the incisions will be closed with stitches (sutures), staples, or skin glue. Your incisions will be covered with a bandage (dressing). The procedure may vary among health care providers and hospitals. What happens after the procedure? Your blood pressure, heart rate, breathing rate, and blood oxygen level will be monitored until you leave the hospital or clinic. You will be given medicines as needed to control your pain. You may have a drain placed in the incision. The drain will be removed a day or two after the procedure. Summary Minimally invasive cholecystectomy, also called laparoscopic cholecystectomy, is surgery to remove the gallbladder using small  incisions. Tell your health care provider about all the medical conditions you have and all the medicines you are taking for those conditions. Before the procedure, follow instructions about when to stop eating and drinking and changing or stopping medicines. Plan to have a responsible adult care for you for the time you are told after you leave the hospital or clinic. This information is not intended to replace advice given to you by your health care provider. Make sure you discuss any questions you have with your health care provider. Document Revised: 06/09/2021 Document Reviewed: 06/09/2021 Elsevier Patient Education  2024 [employer]  

## 2023-05-24 ENCOUNTER — Telehealth: Payer: Self-pay | Admitting: Surgery

## 2023-05-24 NOTE — Telephone Encounter (Signed)
Patient has been advised of Pre-Admission date/time, and Surgery date at [facility].  Surgery Date: 06/09/23 Preadmission Testing Date: 06/01/23 (phone 8a-1p)  Patient has been made aware to call [phone], between 1-3:00pm the day before surgery, to find out what time to arrive for surgery.    

## 2023-06-01 ENCOUNTER — Encounter
Admission: RE | Admit: 2023-06-01 | Discharge: 2023-06-01 | Disposition: A | Discharge status: Home / Self Care | Payer: 59 | Source: Ambulatory Visit | Attending: Surgery | Admitting: Surgery

## 2023-06-01 VITALS — Ht 67.0 in | Wt 212.0 lb

## 2023-06-01 DIAGNOSIS — Z01812 Encounter for preprocedural laboratory examination: Secondary | ICD-10-CM

## 2023-06-01 DIAGNOSIS — I10 Essential (primary) hypertension: Secondary | ICD-10-CM

## 2023-06-01 HISTORY — DX: Benign neoplasm of colon, unspecified: D12.6

## 2023-06-01 HISTORY — DX: Neoplasm of uncertain behavior of right ovary: D39.11

## 2023-06-01 HISTORY — DX: Essential (primary) hypertension: I10

## 2023-06-01 HISTORY — DX: Ganglion, right hand: M67.441

## 2023-06-01 HISTORY — DX: Iron deficiency anemia, unspecified: D50.9

## 2023-06-01 NOTE — Patient Instructions (Addendum)
Your procedure is scheduled on: Thursday, June 20 Report to the Registration Desk on the 1st floor of the [facility] To find out your arrival time, please call [phone] between 1PM - 3PM on: Wednesday, June 19 If your arrival time is 6:00 am, do not arrive before that time as the [facility] entrance doors do not open until 6:00 am.  REMEMBER: Instructions that are not followed completely may result in serious medical risk, up to and including death; or upon the discretion of your surgeon and anesthesiologist your surgery may need to be rescheduled.  Do not eat or drink after midnight the night before surgery.  No gum chewing or hard candies.  One week prior to surgery: starting June 13 Stop Anti-inflammatories (NSAIDS) such as Advil, Aleve, Ibuprofen, Motrin, Naproxen, Naprosyn and Aspirin based products such as Excedrin, Goody's Powder, BC Powder. Stop ANY OVER THE COUNTER supplements until after surgery. Stop fish oil, vitamin B. You may however, continue to take Tylenol if needed for pain up until the day of surgery.  Continue taking all prescribed medications   TAKE ONLY THESE MEDICATIONS THE MORNING OF SURGERY WITH A SIP OF WATER:  Amlodipine Levothyroxine  No Alcohol for 24 hours before or after surgery.  No Smoking including e-cigarettes for 24 hours before surgery.  No chewable tobacco products for at least 6 hours before surgery.  No nicotine patches on the day of surgery.  Do not use any "recreational" drugs for at least a week (preferably 2 weeks) before your surgery.  Please be advised that the combination of cocaine and anesthesia may have negative outcomes, up to and including death. If you test positive for cocaine, your surgery will be cancelled.  On the morning of surgery brush your teeth with toothpaste and water, you may rinse your mouth with mouthwash if you wish. Do not swallow any toothpaste or mouthwash.  Use CHG Soap as directed on instruction  sheet.  Do not wear jewelry, make-up, hairpins, clips or nail polish.  Do not wear lotions, powders, or perfumes.   Do not shave body hair from the neck down 48 hours before surgery.  Contact lenses, hearing aids and dentures may not be worn into surgery.  Do not bring valuables to the hospital. [facility] is not responsible for any missing/lost belongings or valuables.   Notify your doctor if there is any change in your medical condition (cold, fever, infection).  Wear comfortable clothing (specific to your surgery type) to the hospital.  After surgery, you can help prevent lung complications by doing breathing exercises.  Take deep breaths and cough every 1-2 hours. Your doctor may order a device called an Incentive Spirometer to help you take deep breaths. When coughing or sneezing, hold a pillow firmly against your incision with both hands. This is called "splinting." Doing this helps protect your incision. It also decreases belly discomfort.  If you are being discharged the day of surgery, you will not be allowed to drive home. You will need a responsible individual to drive you home and stay with you for 24 hours after surgery.   If you are taking public transportation, you will need to have a responsible individual with you.  Please call the Pre-admissions Testing Dept. at [phone] if you have any questions about these instructions.  Surgery Visitation Policy:  Patients having surgery or a procedure may have two visitors.  Children under the age of 42 must have an adult with them who is not  the patient.     Preparing for Surgery with CHLORHEXIDINE GLUCONATE (CHG) Soap  Chlorhexidine Gluconate (CHG) Soap  o An antiseptic cleaner that kills germs and bonds with the skin to continue killing germs even after washing  o Used for showering the night before surgery and morning of surgery  Before surgery, you can play an important role by reducing the number of germs  on your skin.  CHG (Chlorhexidine gluconate) soap is an antiseptic cleanser which kills germs and bonds with the skin to continue killing germs even after washing.  Please do not use if you have an allergy to CHG or antibacterial soaps. If your skin becomes reddened/irritated stop using the CHG.  1. Shower the NIGHT BEFORE SURGERY and the MORNING OF SURGERY with CHG soap.  2. If you choose to wash your hair, wash your hair first as usual with your normal shampoo.  3. After shampooing, rinse your hair and body thoroughly to remove the shampoo.  4. Use CHG as you would any other liquid soap. You can apply CHG directly to the skin and wash gently with a scrungie or a clean washcloth.  5. Apply the CHG soap to your body only from the neck down. Do not use on open wounds or open sores. Avoid contact with your eyes, ears, mouth, and genitals (private parts). Wash face and genitals (private parts) with your normal soap.  6. Wash thoroughly, paying special attention to the area where your surgery will be performed.  7. Thoroughly rinse your body with warm water.  8. Do not shower/wash with your normal soap after using and rinsing off the CHG soap.  9. Pat yourself dry with a clean towel.  10. Wear clean pajamas to bed the night before surgery.  12. Place clean sheets on your bed the night of your first shower and do not sleep with pets.  13. Shower again with the CHG soap on the day of surgery prior to arriving at the hospital.  14. Do not apply any deodorants/lotions/powders.  15. Please wear clean clothes to the hospital.

## 2023-06-02 ENCOUNTER — Encounter
Admission: RE | Admit: 2023-06-02 | Discharge: 2023-06-02 | Disposition: A | Discharge status: Home / Self Care | Payer: 59 | Source: Ambulatory Visit | Attending: Surgery | Admitting: Surgery

## 2023-06-02 DIAGNOSIS — I10 Essential (primary) hypertension: Secondary | ICD-10-CM | POA: Diagnosis not present

## 2023-06-02 DIAGNOSIS — Z01810 Encounter for preprocedural cardiovascular examination: Secondary | ICD-10-CM | POA: Insufficient documentation

## 2023-06-02 DIAGNOSIS — Z01812 Encounter for preprocedural laboratory examination: Secondary | ICD-10-CM

## 2023-06-08 MED ORDER — CHLORHEXIDINE GLUCONATE CLOTH 2 % EX PADS: 6.0000 | MEDICATED_PAD | Freq: Once | CUTANEOUS | Status: DC

## 2023-06-08 MED ORDER — INDOCYANINE GREEN 25 MG IV SOLR
2.5000 mg | Freq: Once | INTRAVENOUS | Status: AC
  Administered 2023-06-09: 2.5 mg via INTRAVENOUS

## 2023-06-08 MED ORDER — ACETAMINOPHEN 500 MG PO TABS
1000.0000 mg | ORAL_TABLET | ORAL | Status: AC
  Administered 2023-06-09: 1000 mg via ORAL

## 2023-06-08 MED ORDER — FAMOTIDINE 20 MG PO TABS
20.0000 mg | ORAL_TABLET | Freq: Once | ORAL | Status: AC
  Administered 2023-06-09: 20 mg via ORAL

## 2023-06-08 MED ORDER — ORAL CARE MOUTH RINSE: 15.0000 mL | Freq: Once | OROMUCOSAL | Status: AC

## 2023-06-08 MED ORDER — GABAPENTIN 300 MG PO CAPS
300.0000 mg | ORAL_CAPSULE | ORAL | Status: AC
  Administered 2023-06-09: 300 mg via ORAL

## 2023-06-08 MED ORDER — CHLORHEXIDINE GLUCONATE 0.12 % MT SOLN
15.0000 mL | Freq: Once | OROMUCOSAL | Status: AC
  Administered 2023-06-09: 15 mL via OROMUCOSAL

## 2023-06-08 MED ORDER — CEFAZOLIN SODIUM-DEXTROSE 2-4 GM/100ML-% IV SOLN
2.0000 g | INTRAVENOUS | Status: AC
  Administered 2023-06-09: 2 g via INTRAVENOUS

## 2023-06-08 MED ORDER — LACTATED RINGERS IV SOLN: INTRAVENOUS | Status: DC

## 2023-06-08 MED ORDER — CELECOXIB 200 MG PO CAPS
200.0000 mg | ORAL_CAPSULE | ORAL | Status: AC
  Administered 2023-06-09: 200 mg via ORAL

## 2023-06-09 ENCOUNTER — Other Ambulatory Visit: Payer: Self-pay

## 2023-06-09 ENCOUNTER — Telehealth: Payer: Self-pay | Admitting: Surgery

## 2023-06-09 ENCOUNTER — Encounter: Payer: Self-pay | Admitting: Surgery

## 2023-06-09 ENCOUNTER — Ambulatory Visit: Payer: 59 | Admitting: Registered Nurse

## 2023-06-09 ENCOUNTER — Encounter
Admission: RE | Disposition: A | Discharge status: Home / Self Care | Payer: Self-pay | Source: Ambulatory Visit | Attending: Surgery

## 2023-06-09 ENCOUNTER — Ambulatory Visit
Admission: RE | Admit: 2023-06-09 | Discharge: 2023-06-09 | Disposition: A | Discharge status: Home / Self Care | Payer: 59 | Source: Ambulatory Visit | Attending: Surgery | Admitting: Surgery

## 2023-06-09 DIAGNOSIS — Z79899 Other long term (current) drug therapy: Secondary | ICD-10-CM | POA: Insufficient documentation

## 2023-06-09 DIAGNOSIS — I10 Essential (primary) hypertension: Secondary | ICD-10-CM | POA: Diagnosis not present

## 2023-06-09 DIAGNOSIS — Z85038 Personal history of other malignant neoplasm of large intestine: Secondary | ICD-10-CM | POA: Insufficient documentation

## 2023-06-09 DIAGNOSIS — K8010 Calculus of gallbladder with chronic cholecystitis without obstruction: Secondary | ICD-10-CM | POA: Diagnosis not present

## 2023-06-09 DIAGNOSIS — Z9221 Personal history of antineoplastic chemotherapy: Secondary | ICD-10-CM | POA: Insufficient documentation

## 2023-06-09 DIAGNOSIS — Z923 Personal history of irradiation: Secondary | ICD-10-CM | POA: Insufficient documentation

## 2023-06-09 DIAGNOSIS — K8050 Calculus of bile duct without cholangitis or cholecystitis without obstruction: Secondary | ICD-10-CM | POA: Diagnosis present

## 2023-06-09 DIAGNOSIS — K8020 Calculus of gallbladder without cholecystitis without obstruction: Secondary | ICD-10-CM

## 2023-06-09 SURGERY — CHOLECYSTECTOMY, ROBOT-ASSISTED, LAPAROSCOPIC
Anesthesia: General

## 2023-06-09 MED ORDER — CELECOXIB 200 MG PO CAPS
ORAL_CAPSULE | ORAL | Status: AC
  Filled 2023-06-09: qty 1

## 2023-06-09 MED ORDER — PHENYLEPHRINE HCL (PRESSORS) 10 MG/ML IV SOLN
INTRAVENOUS | Status: DC | PRN
  Administered 2023-06-09: 240 ug via INTRAVENOUS
  Administered 2023-06-09 (×2): 160 ug via INTRAVENOUS
  Administered 2023-06-09: 80 ug via INTRAVENOUS
  Administered 2023-06-09: 160 ug via INTRAVENOUS
  Administered 2023-06-09: 80 ug via INTRAVENOUS

## 2023-06-09 MED ORDER — PHENYLEPHRINE 80 MCG/ML (10ML) SYRINGE FOR IV PUSH (FOR BLOOD PRESSURE SUPPORT)
PREFILLED_SYRINGE | INTRAVENOUS | Status: AC
  Filled 2023-06-09: qty 10

## 2023-06-09 MED ORDER — FENTANYL CITRATE (PF) 100 MCG/2ML IJ SOLN: 25.0000 ug | INTRAMUSCULAR | Status: DC | PRN

## 2023-06-09 MED ORDER — ONDANSETRON HCL 4 MG/2ML IJ SOLN: 4.0000 mg | Freq: Once | INTRAMUSCULAR | Status: DC | PRN

## 2023-06-09 MED ORDER — LIDOCAINE HCL (CARDIAC) PF 100 MG/5ML IV SOSY
PREFILLED_SYRINGE | INTRAVENOUS | Status: DC | PRN
  Administered 2023-06-09: 100 mg via INTRAVENOUS

## 2023-06-09 MED ORDER — OXYCODONE HCL 5 MG/5ML PO SOLN: 5.0000 mg | Freq: Once | ORAL | Status: DC | PRN

## 2023-06-09 MED ORDER — ROCURONIUM BROMIDE 100 MG/10ML IV SOLN
INTRAVENOUS | Status: DC | PRN
  Administered 2023-06-09: 20 mg via INTRAVENOUS
  Administered 2023-06-09: 40 mg via INTRAVENOUS

## 2023-06-09 MED ORDER — SUGAMMADEX SODIUM 200 MG/2ML IV SOLN
INTRAVENOUS | Status: DC | PRN
  Administered 2023-06-09: 200 mg via INTRAVENOUS

## 2023-06-09 MED ORDER — INDOCYANINE GREEN 25 MG IV SOLR
INTRAVENOUS | Status: AC
  Filled 2023-06-09: qty 10

## 2023-06-09 MED ORDER — PROPOFOL 10 MG/ML IV BOLUS
INTRAVENOUS | Status: DC | PRN
  Administered 2023-06-09: 150 mg via INTRAVENOUS

## 2023-06-09 MED ORDER — DEXAMETHASONE SODIUM PHOSPHATE 10 MG/ML IJ SOLN
INTRAMUSCULAR | Status: AC
  Filled 2023-06-09: qty 1

## 2023-06-09 MED ORDER — DEXAMETHASONE SODIUM PHOSPHATE 10 MG/ML IJ SOLN
INTRAMUSCULAR | Status: DC | PRN
  Administered 2023-06-09: 5 mg via INTRAVENOUS

## 2023-06-09 MED ORDER — EPHEDRINE 5 MG/ML INJ
INTRAVENOUS | Status: AC
  Filled 2023-06-09: qty 5

## 2023-06-09 MED ORDER — BUPIVACAINE LIPOSOME 1.3 % IJ SUSP
INTRAMUSCULAR | Status: AC
  Filled 2023-06-09: qty 20

## 2023-06-09 MED ORDER — FENTANYL CITRATE (PF) 100 MCG/2ML IJ SOLN
INTRAMUSCULAR | Status: DC | PRN
  Administered 2023-06-09: 100 ug via INTRAVENOUS

## 2023-06-09 MED ORDER — GABAPENTIN 300 MG PO CAPS
ORAL_CAPSULE | ORAL | Status: AC
  Filled 2023-06-09: qty 1

## 2023-06-09 MED ORDER — CHLORHEXIDINE GLUCONATE 0.12 % MT SOLN
OROMUCOSAL | Status: AC
  Filled 2023-06-09: qty 15

## 2023-06-09 MED ORDER — BUPIVACAINE HCL (PF) 0.25 % IJ SOLN
INTRAMUSCULAR | Status: AC
  Filled 2023-06-09: qty 30

## 2023-06-09 MED ORDER — MIDAZOLAM HCL 2 MG/2ML IJ SOLN
INTRAMUSCULAR | Status: DC | PRN
  Administered 2023-06-09: 2 mg via INTRAVENOUS

## 2023-06-09 MED ORDER — ACETAMINOPHEN 500 MG PO TABS
ORAL_TABLET | ORAL | Status: AC
  Filled 2023-06-09: qty 2

## 2023-06-09 MED ORDER — BUPIVACAINE LIPOSOME 1.3 % IJ SUSP
INTRAMUSCULAR | Status: DC | PRN
  Administered 2023-06-09: 20 mL

## 2023-06-09 MED ORDER — HYDROCODONE-ACETAMINOPHEN 5-325 MG PO TABS: 1.0000 | ORAL_TABLET | ORAL | 0 refills | Status: DC | PRN

## 2023-06-09 MED ORDER — EPINEPHRINE PF 1 MG/ML IJ SOLN
INTRAMUSCULAR | Status: AC
  Filled 2023-06-09: qty 1

## 2023-06-09 MED ORDER — ONDANSETRON HCL 4 MG/2ML IJ SOLN
INTRAMUSCULAR | Status: AC
  Filled 2023-06-09: qty 2

## 2023-06-09 MED ORDER — EPHEDRINE SULFATE (PRESSORS) 50 MG/ML IJ SOLN
INTRAMUSCULAR | Status: DC | PRN
  Administered 2023-06-09: 7.5 mg via INTRAVENOUS
  Administered 2023-06-09: 10 mg via INTRAVENOUS
  Administered 2023-06-09: 7.5 mg via INTRAVENOUS

## 2023-06-09 MED ORDER — OXYCODONE HCL 5 MG PO TABS: 5.0000 mg | ORAL_TABLET | Freq: Once | ORAL | Status: DC | PRN

## 2023-06-09 MED ORDER — PROPOFOL 10 MG/ML IV BOLUS
INTRAVENOUS | Status: AC
  Filled 2023-06-09: qty 20

## 2023-06-09 MED ORDER — BUPIVACAINE-EPINEPHRINE (PF) 0.25% -1:200000 IJ SOLN
INTRAMUSCULAR | Status: DC | PRN
  Administered 2023-06-09: 30 mL

## 2023-06-09 MED ORDER — FAMOTIDINE 20 MG PO TABS
ORAL_TABLET | ORAL | Status: AC
  Filled 2023-06-09: qty 1

## 2023-06-09 MED ORDER — ONDANSETRON HCL 4 MG/2ML IJ SOLN
INTRAMUSCULAR | Status: DC | PRN
  Administered 2023-06-09: 4 mg via INTRAVENOUS

## 2023-06-09 MED ORDER — LIDOCAINE HCL (PF) 2 % IJ SOLN
INTRAMUSCULAR | Status: AC
  Filled 2023-06-09: qty 5

## 2023-06-09 MED ORDER — CEFAZOLIN SODIUM-DEXTROSE 2-4 GM/100ML-% IV SOLN
INTRAVENOUS | Status: AC
  Filled 2023-06-09: qty 100

## 2023-06-09 MED ORDER — PHENYLEPHRINE HCL-NACL 20-0.9 MG/250ML-% IV SOLN
INTRAVENOUS | Status: AC
  Filled 2023-06-09: qty 250

## 2023-06-09 MED ORDER — MIDAZOLAM HCL 2 MG/2ML IJ SOLN
INTRAMUSCULAR | Status: AC
  Filled 2023-06-09: qty 2

## 2023-06-09 MED ORDER — PHENYLEPHRINE HCL-NACL 20-0.9 MG/250ML-% IV SOLN
INTRAVENOUS | Status: DC | PRN
  Administered 2023-06-09: 40 ug/min via INTRAVENOUS

## 2023-06-09 MED ORDER — ROCURONIUM BROMIDE 10 MG/ML (PF) SYRINGE
PREFILLED_SYRINGE | INTRAVENOUS | Status: AC
  Filled 2023-06-09: qty 10

## 2023-06-09 MED ORDER — FENTANYL CITRATE (PF) 100 MCG/2ML IJ SOLN
INTRAMUSCULAR | Status: AC
  Filled 2023-06-09: qty 2

## 2023-06-09 SURGICAL SUPPLY — 46 items
ADH SKN CLS APL DERMABOND .7 (GAUZE/BANDAGES/DRESSINGS) ×1
CANNULA REDUCER 12-8 DVNC XI (CANNULA) ×1 IMPLANT
CATH REDDICK CHOLANGI 4FR 50CM (CATHETERS) IMPLANT
CAUTERY HOOK MNPLR 1.6 DVNC XI (INSTRUMENTS) ×1 IMPLANT
CLIP LIGATING HEMO O LOK GREEN (MISCELLANEOUS) ×1 IMPLANT
DERMABOND ADVANCED .7 DNX12 (GAUZE/BANDAGES/DRESSINGS) ×1 IMPLANT
DRAPE ARM DVNC X/XI (DISPOSABLE) ×4 IMPLANT
DRAPE COLUMN DVNC XI (DISPOSABLE) ×1 IMPLANT
ELECT CAUTERY BLADE 6.4 (BLADE) ×1 IMPLANT
ELECT REM PT RETURN 9FT ADLT (ELECTROSURGICAL) ×1
ELECTRODE REM PT RTRN 9FT ADLT (ELECTROSURGICAL) ×1 IMPLANT
FORCEPS BPLR R/ABLATION 8 DVNC (INSTRUMENTS) ×1 IMPLANT
FORCEPS PROGRASP DVNC XI (FORCEP) ×1 IMPLANT
GLOVE BIO SURGEON STRL SZ7 (GLOVE) ×2 IMPLANT
GOWN STRL REUS W/ TWL LRG LVL3 (GOWN DISPOSABLE) ×4 IMPLANT
GOWN STRL REUS W/TWL LRG LVL3 (GOWN DISPOSABLE) ×3
IRRIGATION STRYKERFLOW (MISCELLANEOUS) IMPLANT
IRRIGATOR STRYKERFLOW (MISCELLANEOUS)
IV CATH ANGIO 12GX3 LT BLUE (NEEDLE) IMPLANT
KIT PINK PAD W/HEAD ARE REST (MISCELLANEOUS) ×1
KIT PINK PAD W/HEAD ARM REST (MISCELLANEOUS) ×1 IMPLANT
LABEL OR SOLS (LABEL) ×1 IMPLANT
MANIFOLD NEPTUNE II (INSTRUMENTS) ×1 IMPLANT
NDL HYPO 22X1.5 SAFETY MO (MISCELLANEOUS) ×1 IMPLANT
NEEDLE HYPO 22X1.5 SAFETY MO (MISCELLANEOUS) ×1 IMPLANT
NS IRRIG 500ML POUR BTL (IV SOLUTION) ×1 IMPLANT
OBTURATOR OPTICAL STND 8 DVNC (TROCAR) ×1
OBTURATOR OPTICALSTD 8 DVNC (TROCAR) ×1 IMPLANT
PACK LAP CHOLECYSTECTOMY (MISCELLANEOUS) ×1 IMPLANT
PENCIL SMOKE EVACUATOR (MISCELLANEOUS) ×1 IMPLANT
SEAL UNIV 5-12 XI (MISCELLANEOUS) ×4 IMPLANT
SET TUBE SMOKE EVAC HIGH FLOW (TUBING) ×1 IMPLANT
SOL ELECTROSURG ANTI STICK (MISCELLANEOUS) ×1
SOLUTION ELECTROSURG ANTI STCK (MISCELLANEOUS) ×1 IMPLANT
SPIKE FLUID TRANSFER (MISCELLANEOUS) ×1 IMPLANT
SPONGE T-LAP 18X18 ~~LOC~~+RFID (SPONGE) ×1 IMPLANT
SPONGE T-LAP 4X18 ~~LOC~~+RFID (SPONGE) IMPLANT
STOPCOCK 3WAY MALE LL (IV SETS) IMPLANT
SUT MNCRL AB 4-0 PS2 18 (SUTURE) ×1 IMPLANT
SUT VICRYL 0 UR6 27IN ABS (SUTURE) ×2 IMPLANT
SYR 20ML LL LF (SYRINGE) IMPLANT
SYS BAG RETRIEVAL 10MM (BASKET) ×1
SYSTEM BAG RETRIEVAL 10MM (BASKET) ×1 IMPLANT
TRAP FLUID SMOKE EVACUATOR (MISCELLANEOUS) ×1 IMPLANT
WATER STERILE IRR 3000ML UROMA (IV SOLUTION) IMPLANT
WATER STERILE IRR 500ML POUR (IV SOLUTION) ×1 IMPLANT

## 2023-06-09 NOTE — Op Note (Signed)
Robotic assisted laparoscopic Cholecystectomy  Pre-operative Diagnosis: biliary colic  Post-operative Diagnosis: same  Procedure:  Robotic assisted laparoscopic Cholecystectomy  Surgeon: Sterling Big, MD FACS  Anesthesia: Gen. with endotracheal tube  Findings: Chronic moderate Cholecystitis w significant adhesions from the duodenum to the GB and the omentum to the abd wall Gallbladder filled with multiple stones very distended and very large  Estimated Blood Loss: 10cc       Specimens: Gallbladder           Complications: none   Procedure Details  The patient was seen again in the Holding Room. The benefits, complications, treatment options, and expected outcomes were discussed with the patient. The risks of bleeding, infection, recurrence of symptoms, failure to resolve symptoms, bile duct damage, bile duct leak, retained common bile duct stone, bowel injury, any of which could require further surgery and/or ERCP, stent, or papillotomy were reviewed with the patient. The likelihood of improving the patient's symptoms with return to their baseline status is good.  The patient and/or family concurred with the proposed plan, giving informed consent.  The patient was taken to Operating Room, identified  and the procedure verified as Laparoscopic Cholecystectomy.  A Time Out was held and the above information confirmed.  Prior to the induction of general anesthesia, antibiotic prophylaxis was administered. VTE prophylaxis was in place. General endotracheal anesthesia was then administered and tolerated well. After the induction, the abdomen was prepped with Chloraprep and draped in the sterile fashion. The patient was positioned in the supine position.  Cut down technique was used to enter the abdominal cavity and a Hasson trochar was placed after two vicryl stitches were anchored to the fascia. Pneumoperitoneum was then created with CO2 and tolerated well without any adverse changes in the  patient's vital signs.  Three 8-mm ports were placed under direct vision. All skin incisions  were infiltrated with a local anesthetic agent before making the incision and placing the trocars.   The patient was positioned  in reverse Trendelenburg, robot was brought to the surgical field and docked in the standard fashion.  We made sure all the instrumentation was kept indirect view at all times and that there were no collision between the arms. I scrubbed out and went to the console. Adhesions were seen , omentum to the abd wall, those were taken down with cutting setting on electrocautery. The gallbladder was identified, the fundus grasped and retracted cephalad. Adhesions were lysed bluntly and were feasible with cautery.. The infundibulum was grasped and retracted laterally, exposing the peritoneum overlying the triangle of Calot. This was then divided and exposed in a blunt fashion. An extended critical view of the cystic duct and cystic artery was obtained.  The cystic duct was clearly identified and bluntly dissected.   Artery and duct were double clipped and divided. Using ICG cholangiography we visualize the cystic duct and CBD w/o evidence of bile injuries. The gallbladder was taken from the gallbladder fossa in a retrograde fashion with the electrocautery.  Hemostasis was achieved with the electrocautery. Inspection of the right upper quadrant was performed. No bleeding, bile duct injury or leak, or bowel injury was noted. Robotic instruments and robotic arms were undocked in the standard fashion.  I scrubbed back in.  The gallbladder was removed and placed in an Endocatch bag.   Pneumoperitoneum was released.  The periumbilical port site was closed with interrumpted 0 Vicryl sutures. 4-0 subcuticular Monocryl was used to close the skin. Dermabond was  applied.  The  patient was then extubated and brought to the recovery room in stable condition. Sponge, lap, and needle counts were correct at  closure and at the conclusion of the case.               Sterling Big, MD, FACS

## 2023-06-09 NOTE — Anesthesia Preprocedure Evaluation (Signed)
Anesthesia Evaluation  Patient identified by MRN, date of birth, ID band Patient awake    Reviewed: Allergy & Precautions, NPO status , Patient's Chart, lab work & pertinent test results  History of Anesthesia Complications Negative for: history of anesthetic complications  Airway Mallampati: II  TM Distance: >3 FB Neck ROM: Full    Dental no notable dental hx. (+) Teeth Intact   Pulmonary neg pulmonary ROS, neg sleep apnea, neg COPD, Patient abstained from smoking.Not current smoker   Pulmonary exam normal breath sounds clear to auscultation       Cardiovascular Exercise Tolerance: Good METShypertension, Pt. on medications (-) CAD and (-) Past MI (-) dysrhythmias  Rhythm:Regular Rate:Normal - Systolic murmurs    Neuro/Psych  Headaches  negative psych ROS   GI/Hepatic ,neg GERD  ,,(+)     (-) substance abuse    Endo/Other  neg diabetesHypothyroidism    Renal/GU negative Renal ROS     Musculoskeletal   Abdominal  (+) + obese  Peds  Hematology   Anesthesia Other Findings Past Medical History: No date: Adenomatous polyp of colon No date: Anemia 11/11/2017: Cancer of sigmoid colon (HCC)     Comment:  Partial sigmoid colon resection, chemo + rad tx's.  No date: Diverticulosis No date: Essential hypertension 05/2023: Gallstones No date: Ganglion cyst of finger of right hand 12/14/2017: Genetic testing     Comment:  Common Cancers panel (47 genes) @ [facility] - No               pathogenic mutations detected No date: Headache No date: Heart murmur No date: Hyperlipidemia No date: Hypothyroidism No date: Iron deficiency anemia No date: Ovarian tumor of borderline malignancy, right No date: Thyroid disease  Reproductive/Obstetrics                             Anesthesia Physical Anesthesia Plan  ASA: 2  Anesthesia Plan: General   Post-op Pain Management: Tylenol PO (pre-op)*,  Celebrex PO (pre-op)* and Gabapentin PO (pre-op)*   Induction: Intravenous  PONV Risk Score and Plan: 4 or greater and Ondansetron, Dexamethasone and Midazolam  Airway Management Planned: Oral ETT and Video Laryngoscope Planned  Additional Equipment: None  Intra-op Plan:   Post-operative Plan: Extubation in OR  Informed Consent: I have reviewed the patients History and Physical, chart, labs and discussed the procedure including the risks, benefits and alternatives for the proposed anesthesia with the patient or authorized representative who has indicated his/her understanding and acceptance.     Dental advisory given  Plan Discussed with: CRNA and Surgeon  Anesthesia Plan Comments: (Discussed risks of anesthesia with patient, including PONV, sore throat, lip/dental/eye damage. Rare risks discussed as well, such as cardiorespiratory and neurological sequelae, and allergic reactions. Discussed the role of CRNA in patient's perioperative care. Patient understands.)       Anesthesia Quick Evaluation

## 2023-06-09 NOTE — Discharge Instructions (Addendum)
Laparoscopic Cholecystectomy, Care After   These instructions give you information on caring for yourself after your procedure. Your doctor may also give you more specific instructions. Call your doctor if you have any problems or questions after your procedure.  HOME CARE  Change your bandages (dressings) as told by your doctor.  Keep the wound dry and clean. Wash the wound gently with soap and water. Pat the wound dry with a clean towel.  Do not take baths, swim, or use hot tubs for 2 weeks, or as told by your doctor.  Only take medicine as told by your doctor.  Eat a normal diet as told by your doctor.  Do not lift anything heavier than 10 pounds (4.5 kg) until your doctor says it is okay.  Do not play contact sports for 1 week, or as told by your doctor. GET HELP IF:  Your wound is red, puffy (swollen), or painful.  You have yellowish-white fluid (pus) coming from the wound.  You have fluid draining from the wound for more than 1 day.  You have a bad smell coming from the wound.  Your wound breaks open. GET HELP RIGHT AWAY IF:  You have trouble breathing.  You have chest pain.  You have a fever >101  You have pain in the shoulders (shoulder strap areas) that is getting worse.  You feel dizzy or pass out (faint).  You have severe belly (abdominal) pain.  You feel sick to your stomach (nauseous) or throw up (vomit) for more than 1 day.  AMBULATORY SURGERY  DISCHARGE INSTRUCTIONS   The drugs that you were given will stay in your system until tomorrow so for the next 24 hours you should not:  Drive an automobile Make any legal decisions Drink any alcoholic beverage   You may resume regular meals tomorrow.  Today it is better to start with liquids and gradually work up to solid foods.  You may eat anything you prefer, but it is better to start with liquids, then soup and crackers, and gradually work up to solid foods.   Please notify your doctor immediately if you have any  unusual bleeding, trouble breathing, redness and pain at the surgery site, drainage, fever, or pain not relieved by medication.    Additional Instructions: PLEASE LEAVE EXPAREL (TEAL) ARMBAND ON FOR 4 DAYS   Information for Discharge Teaching: EXPAREL (bupivacaine liposome injectable suspension)   Your surgeon or anesthesiologist gave you EXPAREL(bupivacaine) to help control your pain after surgery.  EXPAREL is a local anesthetic that provides pain relief by numbing the tissue around the surgical site. EXPAREL is designed to release pain medication over time and can control pain for up to 72 hours. Depending on how you respond to EXPAREL, you may require less pain medication during your recovery.  Possible side effects: Temporary loss of sensation or ability to move in the area where bupivacaine was injected. Nausea, vomiting, constipation Rarely, numbness and tingling in your mouth or lips, lightheadedness, or anxiety may occur. Call your doctor right away if you think you may be experiencing any of these sensations, or if you have other questions regarding possible side effects.  Follow all other discharge instructions given to you by your surgeon or nurse. Eat a healthy diet and drink plenty of water or other fluids.  If you return to the hospital for any reason within 96 hours following the administration of EXPAREL, it is important for health care providers to know that you have received this  anesthetic. A teal colored band has been placed on your arm with the date, time and amount of EXPAREL you have received in order to alert and inform your health care providers. Please leave this armband in place for the full 96 hours following administration, and then you may remove the band.   Please contact your physician with any problems or Same Day Surgery at [phone], Monday through Friday 6 am to 4 pm, or [place] [facility] number at [phone].

## 2023-06-09 NOTE — Interval H&P Note (Signed)
History and Physical Interval Note:  06/09/2023 10:01 AM  Regina Patel  has presented today for surgery, with the diagnosis of Calculus of gallbladder without cholecystitis without obstruction.  The various methods of treatment have been discussed with the patient and family. After consideration of risks, benefits and other options for treatment, the patient has consented to  Procedure(s): XI ROBOTIC ASSISTED LAPAROSCOPIC CHOLECYSTECTOMY (N/A) INDOCYANINE GREEN FLUORESCENCE IMAGING (ICG) (N/A) as a surgical intervention.  The patient's history has been reviewed, patient examined, no change in status, stable for surgery.  I have reviewed the patient's chart and labs.  Questions were answered to the patient's satisfaction.     Avalie Oconnor F [place]

## 2023-06-09 NOTE — Transfer of Care (Signed)
Immediate Anesthesia Transfer of Care Note  Patient: Regina Patel  Procedure(s) Performed: XI ROBOTIC ASSISTED LAPAROSCOPIC CHOLECYSTECTOMY INDOCYANINE GREEN FLUORESCENCE IMAGING (ICG)  Patient Location: PACU  Anesthesia Type:General  Level of Consciousness: drowsy and patient cooperative  Airway & Oxygen Therapy: Patient Spontanous Breathing and Patient connected to face mask oxygen  Post-op Assessment: Report given to RN and Post -op Vital signs reviewed and stable  Post vital signs: Reviewed and stable  Last Vitals:  Vitals Value Taken Time  BP 120/72 06/09/23 1207  Temp 36.5 C 06/09/23 1207  Pulse 88 06/09/23 1211  Resp 14 06/09/23 1211  SpO2 100 % 06/09/23 1211  Vitals shown include unvalidated device data.  Last Pain:  Vitals:   06/09/23 0932  TempSrc: Temporal  PainSc: 0-No pain         Complications: No notable events documented.

## 2023-06-09 NOTE — Anesthesia Procedure Notes (Signed)
Procedure Name: Intubation Date/Time: 06/09/2023 10:57 AM  Performed by: Lynden Oxford, CRNAPre-anesthesia Checklist: Patient identified, Emergency Drugs available, Suction available and Patient being monitored Patient Re-evaluated:Patient Re-evaluated prior to induction Oxygen Delivery Method: Circle system utilized Preoxygenation: Pre-oxygenation with 100% oxygen Induction Type: IV induction Ventilation: Mask ventilation without difficulty Laryngoscope Size: McGraph and 3 Grade View: Grade II Tube type: Oral Tube size: 7.0 mm Number of attempts: 1 Airway Equipment and Method: Stylet and Video-laryngoscopy Placement Confirmation: ETT inserted through vocal cords under direct vision, positive ETCO2 and breath sounds checked- equal and bilateral Secured at: 22 cm Tube secured with: Tape Dental Injury: Teeth and Oropharynx as per pre-operative assessment

## 2023-06-09 NOTE — Telephone Encounter (Signed)
Mark with [facility] pharmacy called.   Just received Rx from Dr. Everlene Farrier for Hydrocodone.  They need Rx to say not to exceed more than 8 tablets in 24 hours.  Please call him or send new Rx.   Their number is [phone] Thank you

## 2023-06-10 NOTE — Anesthesia Postprocedure Evaluation (Signed)
Anesthesia Post Note  Patient: Regina Patel  Procedure(s) Performed: XI ROBOTIC ASSISTED LAPAROSCOPIC CHOLECYSTECTOMY INDOCYANINE GREEN FLUORESCENCE IMAGING (ICG)  Patient location during evaluation: PACU Anesthesia Type: General Level of consciousness: awake and alert Pain management: pain level controlled Vital Signs Assessment: post-procedure vital signs reviewed and stable Respiratory status: spontaneous breathing, nonlabored ventilation, respiratory function stable and patient connected to nasal cannula oxygen Cardiovascular status: blood pressure returned to baseline and stable Postop Assessment: no apparent nausea or vomiting Anesthetic complications: no   No notable events documented.   Last Vitals:  Vitals:   06/09/23 1300 06/09/23 1309  BP: 129/70 136/76  Pulse: 93 92  Resp: 15 16  Temp: (!) 36.3 C   SpO2: 94% 96%    Last Pain:  Vitals:   06/09/23 1309  TempSrc:   PainSc: 0-No pain                 Corinda Gubler

## 2023-06-21 ENCOUNTER — Ambulatory Visit
Admission: RE | Admit: 2023-06-21 | Discharge: 2023-06-21 | Disposition: A | Discharge status: Home / Self Care | Payer: 59 | Source: Ambulatory Visit | Attending: Family Medicine | Admitting: Family Medicine

## 2023-06-21 DIAGNOSIS — Z1231 Encounter for screening mammogram for malignant neoplasm of breast: Secondary | ICD-10-CM | POA: Diagnosis present

## 2023-07-26 ENCOUNTER — Other Ambulatory Visit: Payer: Self-pay | Admitting: Family Medicine

## 2023-07-26 DIAGNOSIS — E034 Atrophy of thyroid (acquired): Secondary | ICD-10-CM

## 2023-07-27 NOTE — Telephone Encounter (Signed)
Unable to refill per protocol, Rx request is too soon. Last refill 01/18/23 for 90 and 3 refills.  Requested Prescriptions  Pending Prescriptions Disp Refills   levothyroxine (SYNTHROID) 175 MCG tablet [Pharmacy Med Name: Levothyroxine Sodium 175 MCG Oral Tablet] 90 tablet 0    Sig: TAKE 1 TABLET BY MOUTH ONCE DAILY IN THE MORNING BEFORE BREAKFAST     Endocrinology:  Hypothyroid Agents Failed - 07/26/2023  9:10 AM      Failed - TSH in normal range and within 360 days    TSH  Date Value Ref Range Status  01/17/2023 0.20 (L) mIU/L Final    Comment:              Reference Range .           > or = 20 Years  0.40-4.50 .                Pregnancy Ranges           First trimester    0.26-2.66           Second trimester   0.55-2.73           Third trimester    0.43-2.91          Passed - Valid encounter within last 12 months    Recent Outpatient Visits           3 months ago Annual physical exam   [place] [facility] [place], Netta Neat, DO   6 months ago Hypothyroidism due to acquired atrophy of thyroid   [place] [facility] Smitty Cords, DO   1 year ago Acquired hypothyroidism   [place] [facility] Smitty Cords, DO   2 years ago Acquired hypothyroidism   [place] [facility] Smitty Cords, DO   3 years ago Annual physical exam   [facility] Smitty Cords, DO       Future Appointments             In 9 months Althea Charon, Netta Neat, DO  [facility], [facility]

## 2024-01-24 ENCOUNTER — Other Ambulatory Visit: Payer: Self-pay | Admitting: Family Medicine

## 2024-01-24 DIAGNOSIS — I10 Essential (primary) hypertension: Secondary | ICD-10-CM

## 2024-01-24 DIAGNOSIS — E034 Atrophy of thyroid (acquired): Secondary | ICD-10-CM

## 2024-01-24 NOTE — Telephone Encounter (Signed)
 Medication Refill -  Most Recent Primary Care Visit:  Provider: EDMAN MARSA PARAS  Department: [id number]  Visit Type: PHYSICAL 20  Date: 04/26/2023  Medication: levothyroxine  (SYNTHROID ) 175 MCG tablet , amLODipine  (NORVASC ) 5 MG tablet   Has the patient contacted their pharmacy? No   Is this the correct pharmacy for this prescription? Yes If no, delete pharmacy and type the correct one.  This is the patient's preferred pharmacy:  [facility] [address] ([address]), [place] - [address] [place]) [place] [address] Phone: [phone] Fax: [phone]   Has the prescription been filled recently? No  Is the patient out of the medication? No but she is running low with only a few days left  Has the patient been seen for an appointment in the last year OR does the patient have an upcoming appointment? Yes  Can we respond through MyChart? No she would prefer a call or text  Please assist patient further

## 2024-01-26 MED ORDER — AMLODIPINE BESYLATE 5 MG PO TABS
5.0000 mg | ORAL_TABLET | Freq: Every day | ORAL | 0 refills | Status: DC
Start: 2024-01-26 — End: 2024-05-02

## 2024-01-26 MED ORDER — LEVOTHYROXINE SODIUM 175 MCG PO TABS: 175.0000 ug | ORAL_TABLET | Freq: Every day | ORAL | 0 refills | Status: DC

## 2024-01-26 NOTE — Telephone Encounter (Signed)
 Requested medication (s) are due for refill today: yes  Requested medication (s) are on the active medication list: yes  Last refill:  01/18/23 #90 3 RF  Future visit scheduled:yes  Notes to clinic:  overdue lab work   Requested Prescriptions  Pending Prescriptions Disp Refills   levothyroxine  (SYNTHROID ) 175 MCG tablet 90 tablet     Sig: Take 1 tablet (175 mcg total) by mouth daily before breakfast.     Endocrinology:  Hypothyroid Agents Failed - 01/26/2024  8:51 AM      Failed - TSH in normal range and within 360 days    TSH  Date Value Ref Range Status  01/17/2023 0.20 (L) mIU/L Final    Comment:              Reference Range .           > or = 20 Years  0.40-4.50 .                Pregnancy Ranges           First trimester    0.26-2.66           Second trimester   0.55-2.73           Third trimester    0.43-2.91          Passed - Valid encounter within last 12 months    Recent Outpatient Visits           9 months ago Annual physical exam   [place] [facility] [place], Marsa PARAS, DO   1 year ago Hypothyroidism due to acquired atrophy of thyroid    [place] [facility] Edman Marsa PARAS, DO   2 years ago Acquired hypothyroidism   [place] [facility] Edman Marsa PARAS, DO   3 years ago Acquired hypothyroidism   [place] [facility] Edman Marsa PARAS, DO   4 years ago Annual physical exam   [place] [facility] Edman Marsa PARAS, DO       Future Appointments             In 3 months Edman, Marsa PARAS, DO [place] [facility], [facility]            Signed Prescriptions Disp Refills   amLODipine  (NORVASC ) 5 MG tablet 90 tablet 0    Sig: Take 1 tablet (5 mg total) by mouth daily.     Cardiovascular: Calcium  Channel Blockers 2 Failed - 01/26/2024  8:51 AM      Failed - Valid encounter within last 6 months    Recent  Outpatient Visits           9 months ago Annual physical exam   [place] [facility] [place], Marsa PARAS, DO   1 year ago Hypothyroidism due to acquired atrophy of thyroid    [place] [facility] Edman Marsa PARAS, DO   2 years ago Acquired hypothyroidism   [place] [facility] Edman Marsa PARAS, DO   3 years ago Acquired hypothyroidism   [place] [facility] Edman Marsa PARAS, DO   4 years ago Annual physical exam   [facility] Edman Marsa PARAS, DO       Future Appointments             In 3 months Edman, Marsa PARAS, DO [facility]            Passed - Last BP in normal range    BP Readings from Last 1 Encounters:  06/09/23 136/76         Passed - Last Heart Rate in normal range    Pulse Readings from Last 1 Encounters:  06/09/23 92

## 2024-01-26 NOTE — Telephone Encounter (Signed)
 Requested Prescriptions  Pending Prescriptions Disp Refills   amLODipine  (NORVASC ) 5 MG tablet 90 tablet 0    Sig: Take 1 tablet (5 mg total) by mouth daily.     Cardiovascular: Calcium  Channel Blockers 2 Failed - 01/26/2024  8:50 AM      Failed - Valid encounter within last 6 months    Recent Outpatient Visits           9 months ago Annual physical exam   [place] [facility] [place], Marsa PARAS, DO   1 year ago Hypothyroidism due to acquired atrophy of thyroid    [place] [facility] Edman Marsa PARAS, DO   2 years ago Acquired hypothyroidism   [place] [facility] Edman Marsa PARAS, DO   3 years ago Acquired hypothyroidism   [place] [facility] Edman Marsa PARAS, DO   4 years ago Annual physical exam   [place] [facility] Edman Marsa PARAS, DO       Future Appointments             In 3 months Edman, Marsa PARAS, DO [place] [facility]            Passed - Last BP in normal range    BP Readings from Last 1 Encounters:  06/09/23 136/76         Passed - Last Heart Rate in normal range    Pulse Readings from Last 1 Encounters:  06/09/23 92          levothyroxine  (SYNTHROID ) 175 MCG tablet 90 tablet     Sig: Take 1 tablet (175 mcg total) by mouth daily before breakfast.     Endocrinology:  Hypothyroid Agents Failed - 01/26/2024  8:50 AM      Failed - TSH in normal range and within 360 days    TSH  Date Value Ref Range Status  01/17/2023 0.20 (L) mIU/L Final    Comment:              Reference Range .           > or = 20 Years  0.40-4.50 .                Pregnancy Ranges           First trimester    0.26-2.66           Second trimester   0.55-2.73           Third trimester    0.43-2.91          Passed - Valid encounter within last 12 months    Recent Outpatient Visits           9 months ago Annual  physical exam   [place] [facility], Marsa PARAS, DO   1 year ago Hypothyroidism due to acquired atrophy of thyroid    [place] [facility] Edman Marsa PARAS, DO   2 years ago Acquired hypothyroidism   [place] [facility] Edman Marsa PARAS, DO   3 years ago Acquired hypothyroidism    [facility]man Marsa PARAS, DO   4 years ago Annual physical exam    [facility] Edman Marsa PARAS, DO       Future Appointments             In  3 months Edman, Marsa PARAS, DO  [place], [facility]

## 2024-04-25 ENCOUNTER — Other Ambulatory Visit: Payer: Self-pay

## 2024-04-25 DIAGNOSIS — Z0000 Encounter for general adult medical examination without abnormal findings: Secondary | ICD-10-CM

## 2024-04-25 DIAGNOSIS — E039 Hypothyroidism, unspecified: Secondary | ICD-10-CM

## 2024-04-25 DIAGNOSIS — I10 Essential (primary) hypertension: Secondary | ICD-10-CM

## 2024-04-25 DIAGNOSIS — E66811 Obesity, class 1: Secondary | ICD-10-CM

## 2024-04-25 DIAGNOSIS — R7309 Other abnormal glucose: Secondary | ICD-10-CM

## 2024-04-25 DIAGNOSIS — E782 Mixed hyperlipidemia: Secondary | ICD-10-CM

## 2024-04-26 ENCOUNTER — Encounter: Payer: Self-pay | Admitting: Family Medicine

## 2024-04-26 LAB — CBC WITH DIFFERENTIAL/PLATELET
Absolute Lymphocytes: 1495 {cells}/uL (ref 850–3900)
Absolute Monocytes: 289 {cells}/uL (ref 200–950)
Basophils Absolute: 11 {cells}/uL (ref 0–200)
Basophils Relative: 0.3 %
Eosinophils Absolute: 48 {cells}/uL (ref 15–500)
Eosinophils Relative: 1.3 %
HCT: 43.2 % (ref 35.0–45.0)
Hemoglobin: 14 g/dL (ref 11.7–15.5)
MCH: 29.7 pg (ref 27.0–33.0)
MCHC: 32.4 g/dL (ref 32.0–36.0)
MCV: 91.7 fL (ref 80.0–100.0)
MPV: 10 fL (ref 7.5–12.5)
Monocytes Relative: 7.8 %
Neutro Abs: 1857 {cells}/uL (ref 1500–7800)
Neutrophils Relative %: 50.2 %
Platelets: 205 10*3/uL (ref 140–400)
RBC: 4.71 10*6/uL (ref 3.80–5.10)
RDW: 13.2 % (ref 11.0–15.0)
Total Lymphocyte: 40.4 %
WBC: 3.7 10*3/uL — ABNORMAL LOW (ref 3.8–10.8)

## 2024-04-26 LAB — COMPLETE METABOLIC PANEL WITHOUT GFR
AG Ratio: 1.9 (calc) (ref 1.0–2.5)
ALT: 28 U/L (ref 6–29)
AST: 17 U/L (ref 10–35)
Albumin: 4.4 g/dL (ref 3.6–5.1)
Alkaline phosphatase (APISO): 82 U/L (ref 37–153)
BUN: 12 mg/dL (ref 7–25)
CO2: 28 mmol/L (ref 20–32)
Calcium: 9.4 mg/dL (ref 8.6–10.4)
Chloride: 104 mmol/L (ref 98–110)
Creat: 0.7 mg/dL (ref 0.50–1.03)
Globulin: 2.3 g/dL (ref 1.9–3.7)
Glucose, Bld: 83 mg/dL (ref 65–99)
Potassium: 4.4 mmol/L (ref 3.5–5.3)
Sodium: 139 mmol/L (ref 135–146)
Total Bilirubin: 0.4 mg/dL (ref 0.2–1.2)
Total Protein: 6.7 g/dL (ref 6.1–8.1)

## 2024-04-26 LAB — LIPID PANEL
Cholesterol: 242 mg/dL — ABNORMAL HIGH (ref <200)
HDL: 58 mg/dL (ref >=50)
LDL Cholesterol (Calc): 142 mg/dL — ABNORMAL HIGH
Non-HDL Cholesterol (Calc): 184 mg/dL — ABNORMAL HIGH (ref <130)
Total CHOL/HDL Ratio: 4.2 (calc) (ref <5.0)
Triglycerides: 276 mg/dL — ABNORMAL HIGH (ref <150)

## 2024-04-26 LAB — HEMOGLOBIN A1C
Hgb A1c MFr Bld: 5.8 % — ABNORMAL HIGH (ref <5.7)
Mean Plasma Glucose: 120 mg/dL
eAG (mmol/L): 6.6 mmol/L

## 2024-04-26 LAB — T4, FREE: Free T4: 1.4 ng/dL (ref 0.8–1.8)

## 2024-04-26 LAB — TSH: TSH: 7.99 m[IU]/L — ABNORMAL HIGH

## 2024-05-02 ENCOUNTER — Other Ambulatory Visit: Payer: Self-pay | Admitting: Family Medicine

## 2024-05-02 ENCOUNTER — Encounter: Payer: Self-pay | Admitting: Family Medicine

## 2024-05-02 ENCOUNTER — Ambulatory Visit (INDEPENDENT_AMBULATORY_CARE_PROVIDER_SITE_OTHER): Admitting: Family Medicine

## 2024-05-02 VITALS — BP 140/74 | HR 96 | Ht 67.0 in | Wt 215.2 lb

## 2024-05-02 DIAGNOSIS — Z85038 Personal history of other malignant neoplasm of large intestine: Secondary | ICD-10-CM

## 2024-05-02 DIAGNOSIS — R7309 Other abnormal glucose: Secondary | ICD-10-CM

## 2024-05-02 DIAGNOSIS — Z0000 Encounter for general adult medical examination without abnormal findings: Secondary | ICD-10-CM

## 2024-05-02 DIAGNOSIS — I10 Essential (primary) hypertension: Secondary | ICD-10-CM

## 2024-05-02 DIAGNOSIS — Z1231 Encounter for screening mammogram for malignant neoplasm of breast: Secondary | ICD-10-CM

## 2024-05-02 DIAGNOSIS — E66811 Obesity, class 1: Secondary | ICD-10-CM

## 2024-05-02 DIAGNOSIS — E034 Atrophy of thyroid (acquired): Secondary | ICD-10-CM

## 2024-05-02 DIAGNOSIS — E782 Mixed hyperlipidemia: Secondary | ICD-10-CM

## 2024-05-02 DIAGNOSIS — E039 Hypothyroidism, unspecified: Secondary | ICD-10-CM | POA: Diagnosis not present

## 2024-05-02 MED ORDER — AMLODIPINE BESYLATE 5 MG PO TABS: 5.0000 mg | ORAL_TABLET | Freq: Every day | ORAL | 3 refills | Status: AC

## 2024-05-02 MED ORDER — LEVOTHYROXINE SODIUM 175 MCG PO TABS: 175.0000 ug | ORAL_TABLET | Freq: Every day | ORAL | 3 refills | Status: AC

## 2024-05-02 NOTE — Patient Instructions (Addendum)
 Thank you for coming to the office today.  Refilled medications  Keep an eye on the BP readings, if >140 contact us   2027 Colonoscopy  Recent Labs    04/25/24 0805  HGBA1C 5.8*    FUTURE CONSIDERATION Coronary Calcium  Score Cardiac CT Scan. This is a screening test for patients aged 54-50+ with cardiovascular risk factors or who are healthy but would be interested in Cardiovascular Screening for heart disease. Even if there is a family history of heart disease, this imaging can be useful. Typically it can be done every 5+ years or at a different timeline we agree on  The scan will look at the chest and mainly focus on the heart and identify early signs of calcium  build up or blockages within the heart arteries. It is not 100% accurate for identifying blockages or heart disease, but it is useful to help us  predict who may have some early changes or be at risk in the future for a heart attack or cardiovascular problem.  The results are reviewed by a Cardiologist and they will document the results. It should become available on MyChart. Typically the results are divided into percentiles based on other patients of the same demographic and age. So it will compare your risk to others similar to you. If you have a higher score >99 or higher percentile >75%tile, it is recommended to consider Statin cholesterol therapy and or referral to Cardiologist. I will try to help explain your results and if we have questions we can contact the Cardiologist.  You will be contacted for scheduling. Usually it is done at any imaging facility through [facility], [facility] or [facility].  The cost is $99 flat fee total and it does not go through insurance, so no authorization is required.  DUE for FASTING BLOOD WORK (no food or drink after midnight before the lab appointment, only water or coffee without cream/sugar on the morning of)  SCHEDULE "Lab Only" visit in the morning at  the clinic for lab draw in 1 YEAR  - Make sure Lab Only appointment is at about 1 week before your next appointment, so that results will be available  For Lab Results, once available within 2-3 days of blood draw, you can can log in to MyChart online to view your results and a brief explanation. Also, we can discuss results at next follow-up visit.     Please schedule a Follow-up Appointment to: Return for 1 year fasting lab > 1 week later Annual Physical.  If you have any other questions or concerns, please feel free to call the office or send a message through MyChart. You may also schedule an earlier appointment if necessary.  Additionally, you may be receiving a survey about your experience at our office within a few days to 1 week by e-mail or mail. We value your feedback.  Domingo Friend, DO [facility], [place]

## 2024-05-02 NOTE — Progress Notes (Signed)
 Subjective:    Patient ID: Regina Patel, female    DOB: 28-Mar-1971, 53 y.o.   MRN: [id number]  Regina Patel is a 53 y.o. female presenting on 05/02/2024 for Annual Exam   HPI  Discussed the use of AI scribe software for clinical note transcription with the patient, who gave verbal consent to proceed.  Regina Patel is a 53 year old female who presents for an annual physical exam.    Elevated A1c Her A1c is 5.8%, consistent with previous results, indicating stable prediabetes. She has been less attentive to her diet recently due to moving and unpacking but plans to resume her previous dietary habits. Denies hypoglycemia, polyuria, visual changes, numbness or tingling.   Hyperlipidemia Her cholesterol levels have been stable, with an LDL of 142 mg/dL noted a week ago, consistent with previous levels. She is not on cholesterol medication. Not on medication   Hypothyroidism Last lab 04/2024, normal range T4 but elevated TSH Currently on Levothyroxine  175mcg daily, tolerating well   Essential Hypertension Obesity BMI >33 Home BP readings SBP 130s Admits recent stressors have raised her BP Current Med: Amlodipine  5mg  daily Denies CP, dyspnea, HA, edema, dizziness / lightheadedness   She is improving lifestyle diet exercise regimen goal wt loss    History of Colon Cancer Followed by Dr Antony Baumgartner Last Colonoscopy 05/04/23 Dr Antony Baumgartner Colonoscopy, repeat 3 years, 2027.   Health Maintenance:   Due for Mammogram screening - she will schedule   She received a tetanus shot in April 2025 following a leg injury that required stitches.  She has not received the shingles vaccine and is not ready to do so. She is not interested in the pneumonia vaccine at this time.      05/02/2024    1:43 PM 04/26/2023    3:56 PM 12/25/2021    8:52 AM  Depression screen PHQ 2/9  Decreased Interest 0 0 0  Down, Depressed, Hopeless 0 0 0  PHQ - 2 Score 0 0 0  Altered sleeping 0  0  Tired, decreased energy 1  0   Change in appetite 0  0  Feeling bad or failure about yourself  0  0  Trouble concentrating 0  0  Moving slowly or fidgety/restless 0  0  Suicidal thoughts 0  0  PHQ-9 Score 1  0  Difficult doing work/chores Not difficult at all  Not difficult at all       05/02/2024    1:44 PM 04/26/2023    3:56 PM 12/25/2021    8:52 AM  GAD 7 : Generalized Anxiety Score  Nervous, Anxious, on Edge 0 0 0  Control/stop worrying 0 0 0  Worry too much - different things 0 0 0  Trouble relaxing 0 0 0  Restless 0 0 0  Easily annoyed or irritable 0 0 0  Afraid - awful might happen 0 0 0  Total GAD 7 Score 0 0 0  Anxiety Difficulty   Not difficult at all     Past Medical History:  Diagnosis Date   Adenomatous polyp of colon    Anemia    Cancer of sigmoid colon (HCC) 11/11/2017   Partial sigmoid colon resection, chemo + rad tx's.    Diverticulosis    Essential hypertension    Gallstones 05/2023   Ganglion cyst of finger of right hand    Genetic testing 12/14/2017   Common Cancers panel (47 genes) @ [facility] - No pathogenic mutations detected   Headache  Heart murmur    Hyperlipidemia    Hypothyroidism    Iron deficiency anemia    Ovarian tumor of borderline malignancy, right    Thyroid  disease    Past Surgical History:  Procedure Laterality Date   APPENDECTOMY     possible during hysterectomy   BOWEL RESECTION N/A 11/11/2017   Procedure: SMALL BOWEL RESECTION;  Surgeon: Alben Alma, MD;  Location: [facility] ORS;  Service: General;  Laterality: N/A;   COLONOSCOPY WITH PROPOFOL  N/A 11/10/2018   Procedure: COLONOSCOPY WITH PROPOFOL ;  Surgeon: Luke Salaam, MD;  Location: [facility] ENDOSCOPY;  Service: Gastroenterology;  Laterality: N/A;   COLONOSCOPY WITH PROPOFOL  N/A 05/04/2023   Procedure: COLONOSCOPY WITH PROPOFOL ;  Surgeon: Luke Salaam, MD;  Location: [facility] ENDOSCOPY;  Service: Gastroenterology;  Laterality: N/A;   FLEXIBLE SIGMOIDOSCOPY N/A 11/07/2017   Procedure: FLEXIBLE SIGMOIDOSCOPY;   Surgeon: Luke Salaam, MD;  Location: [facility] ENDOSCOPY;  Service: Gastroenterology;  Laterality: N/A;   LAPAROSCOPIC SIGMOID COLECTOMY N/A 11/11/2017   Procedure: LAPAROSCOPIC SIGMOID COLECTOMY;  Surgeon: Alben Alma, MD;  Location: [facility] ORS;  Service: General;  Laterality: N/A;   MOUTH SURGERY     dental   PORT-A-CATH REMOVAL  10/2018   PORTACATH PLACEMENT Right 11/29/2017   Procedure: INSERTION PORT-A-CATH;  Surgeon: Alben Alma, MD;  Location: [facility];  Service: General;  Laterality: Right;  May do local and IV sedation. Please have U/S   TOTAL ABDOMINAL HYSTERECTOMY  05/20/2014   with right oophorectomy   Social History   Socioeconomic History   Marital status: Married    Spouse name: John   Number of children: 5   Years of education: Not on file   Highest education level: Not on file  Occupational History   Not on file  Tobacco Use   Smoking status: Never   Smokeless tobacco: Never  Vaping Use   Vaping status: Never Used  Substance and Sexual Activity   Alcohol use: No    Alcohol/week: 0.0 standard drinks of alcohol   Drug use: No   Sexual activity: Yes  Other Topics Concern   Not on file  Social History Narrative   Not on file   Social Drivers of Health   Financial Resource Strain: Not on file  Food Insecurity: Not on file  Transportation Needs: Not on file  Physical Activity: Not on file  Stress: Not on file  Social Connections: Not on file  Intimate Partner Violence: Not on file   Family History  Problem Relation Age of Onset   Breast cancer Mother 40       currently 10   Hypertension Mother    Thyroid  disease Brother    Hernia Brother    Thyroid  cancer Maternal Grandmother 52       deceased 84   Stomach cancer Paternal Grandfather        unconfirmed; deceased 95   Stomach cancer Paternal Aunt 49       deceased 74   Colon cancer Maternal Aunt 68       also lung cancer   Stomach cancer Maternal Uncle 8   Current Outpatient  Medications on File Prior to Visit  Medication Sig   CINNAMON PO Take by mouth.   cyanocobalamin  (VITAMIN B12) 1000 MCG tablet Take 1,000 mcg by mouth daily. gummies   Omega-3 Fatty Acids (FISH OIL OMEGA-3) 1000 MG CAPS Take 2 capsules by mouth daily. (Patient not taking: Reported on 05/02/2024)   Current Facility-Administered Medications on File Prior to  Visit  Medication   heparin  lock flush 100 unit/mL   sodium chloride  flush (NS) 0.9 % injection 10 mL    Review of Systems  Constitutional:  Negative for activity change, appetite change, chills, diaphoresis, fatigue and fever.  HENT:  Negative for congestion and hearing loss.   Eyes:  Negative for visual disturbance.  Respiratory:  Negative for cough, chest tightness, shortness of breath and wheezing.   Cardiovascular:  Negative for chest pain, palpitations and leg swelling.  Gastrointestinal:  Negative for abdominal pain, constipation, diarrhea, nausea and vomiting.  Genitourinary:  Negative for dysuria, frequency and hematuria.  Musculoskeletal:  Negative for arthralgias and neck pain.  Skin:  Negative for rash.  Neurological:  Negative for dizziness, weakness, light-headedness, numbness and headaches.  Hematological:  Negative for adenopathy.  Psychiatric/Behavioral:  Negative for behavioral problems, dysphoric mood and sleep disturbance.    Per HPI unless specifically indicated above     Objective:     BP (!) 140/74 (BP Location: Left Arm, Cuff Size: Normal)   Pulse 96   Ht 5\' 7"  (1.702 m)   Wt 215 lb 4 oz (97.6 kg)   SpO2 98%   BMI 33.71 kg/m   Wt Readings from Last 3 Encounters:  05/02/24 215 lb 4 oz (97.6 kg)  06/01/23 212 lb (96.2 kg)  05/23/23 212 lb 9.6 oz (96.4 kg)    Physical Exam Vitals and nursing note reviewed.  Constitutional:      General: She is not in acute distress.    Appearance: She is well-developed. She is obese. She is not diaphoretic.     Comments: Well-appearing, comfortable, cooperative   HENT:     Head: Normocephalic and atraumatic.  Eyes:     General:        Right eye: No discharge.        Left eye: No discharge.     Conjunctiva/sclera: Conjunctivae normal.     Pupils: Pupils are equal, round, and reactive to light.  Neck:     Thyroid : No thyromegaly.     Vascular: No carotid bruit.  Cardiovascular:     Rate and Rhythm: Normal rate and regular rhythm.     Pulses: Normal pulses.     Heart sounds: Normal heart sounds. No murmur heard. Pulmonary:     Effort: Pulmonary effort is normal. No respiratory distress.     Breath sounds: Normal breath sounds. No wheezing or rales.  Abdominal:     General: Bowel sounds are normal. There is no distension.     Palpations: Abdomen is soft. There is no mass.     Tenderness: There is no abdominal tenderness.  Musculoskeletal:        General: No tenderness. Normal range of motion.     Cervical back: Normal range of motion and neck supple.     Right lower leg: No edema.     Left lower leg: No edema.     Comments: Upper / Lower Extremities: - Normal muscle tone, strength bilateral upper extremities 5/5, lower extremities 5/5  Lymphadenopathy:     Cervical: No cervical adenopathy.  Skin:    General: Skin is warm and dry.     Findings: No erythema or rash.  Neurological:     Mental Status: She is alert and oriented to person, place, and time.     Comments: Distal sensation intact to light touch all extremities  Psychiatric:        Mood and Affect: Mood normal.  Behavior: Behavior normal.        Thought Content: Thought content normal.     Comments: Well groomed, good eye contact, normal speech and thoughts     Results for orders placed or performed in visit on 04/25/24  T4, free   Collection Time: 04/25/24  8:05 AM  Result Value Ref Range   Free T4 1.4 0.8 - 1.8 ng/dL  TSH   Collection Time: 04/25/24  8:05 AM  Result Value Ref Range   TSH 7.99 (H) mIU/L  Lipid panel   Collection Time: 04/25/24  8:05 AM   Result Value Ref Range   Cholesterol 242 (H) <200 mg/dL   HDL 58 > OR = 50 mg/dL   Triglycerides 063 (H) <150 mg/dL   LDL Cholesterol (Calc) 142 (H) mg/dL (calc)   Total CHOL/HDL Ratio 4.2 <5.0 (calc)   Non-HDL Cholesterol (Calc) 184 (H) <130 mg/dL (calc)  Hemoglobin K1S   Collection Time: 04/25/24  8:05 AM  Result Value Ref Range   Hgb A1c MFr Bld 5.8 (H) <5.7 %   Mean Plasma Glucose 120 mg/dL   eAG (mmol/L) 6.6 mmol/L  CBC with Differential/Platelet   Collection Time: 04/25/24  8:05 AM  Result Value Ref Range   WBC 3.7 (L) 3.8 - 10.8 Thousand/uL   RBC 4.71 3.80 - 5.10 Million/uL   Hemoglobin 14.0 11.7 - 15.5 g/dL   HCT 01.0 93.2 - 35.5 %   MCV 91.7 80.0 - 100.0 fL   MCH 29.7 27.0 - 33.0 pg   MCHC 32.4 32.0 - 36.0 g/dL   RDW 73.2 20.2 - 54.2 %   Platelets 205 140 - 400 Thousand/uL   MPV 10.0 7.5 - 12.5 fL   Neutro Abs 1,857 1,500 - 7,800 cells/uL   Absolute Lymphocytes 1,495 850 - 3,900 cells/uL   Absolute Monocytes 289 200 - 950 cells/uL   Eosinophils Absolute 48 15 - 500 cells/uL   Basophils Absolute 11 0 - 200 cells/uL   Neutrophils Relative % 50.2 %   Total Lymphocyte 40.4 %   Monocytes Relative 7.8 %   Eosinophils Relative 1.3 %   Basophils Relative 0.3 %  COMPLETE METABOLIC PANEL WITH GFR   Collection Time: 04/25/24  8:05 AM  Result Value Ref Range   Glucose, Bld 83 65 - 99 mg/dL   BUN 12 7 - 25 mg/dL   Creat 7.06 2.37 - 6.28 mg/dL   BUN/Creatinine Ratio SEE NOTE: 6 - 22 (calc)   Sodium 139 135 - 146 mmol/L   Potassium 4.4 3.5 - 5.3 mmol/L   Chloride 104 98 - 110 mmol/L   CO2 28 20 - 32 mmol/L   Calcium  9.4 8.6 - 10.4 mg/dL   Total Protein 6.7 6.1 - 8.1 g/dL   Albumin 4.4 3.6 - 5.1 g/dL   Globulin 2.3 1.9 - 3.7 g/dL (calc)   AG Ratio 1.9 1.0 - 2.5 (calc)   Total Bilirubin 0.4 0.2 - 1.2 mg/dL   Alkaline phosphatase (APISO) 82 37 - 153 U/L   AST 17 10 - 35 U/L   ALT 28 6 - 29 U/L      Assessment & Plan:   Problem List Items Addressed This Visit      Essential hypertension   Relevant Medications   amLODipine  (NORVASC ) 5 MG tablet   History of colon cancer   Hypothyroidism   Relevant Medications   levothyroxine  (SYNTHROID ) 175 MCG tablet   Mixed hyperlipidemia   Relevant Medications   amLODipine  (NORVASC ) 5  MG tablet   Obesity (BMI 30.0-34.9)   Other Visit Diagnoses       Annual physical exam    -  Primary     Encounter for screening mammogram for malignant neoplasm of breast       Relevant Orders   MM 3D SCREENING MAMMOGRAM BILATERAL BREAST        Updated Health Maintenance information Reviewed recent lab results with patient Encouraged improvement to lifestyle with diet and exercise Goal of weight loss  History of Colon cancer Followed by GI Dr Antony Baumgartner Ongoing surveillance with last colonoscopy in May 2024. - Repeat colonoscopy in 2027. - Consider follow-up with Dr. Antony Baumgartner now at [facility] or relocate transfer of care  Hypertension Blood pressure 142/74 mmHg, likely stress-related. Home readings in 130s. Managed with amlodipine  5 mg daily. - Continue amlodipine  5 mg daily. - Encourage regular home blood pressure monitoring. - Reassess blood pressure control at next visit.  Hypothyroidism Fluctuating TSH levels, current TSH elevated last year it was low, T4 stable. No symptom changes warranting medication adjustment. - Continue current dose of levothyroxine  175mcg daily - Monitor thyroid  function tests annually.  Prediabetes HbA1c at 5.8%, unchanged. No medication needed. Lifestyle factors may impact management. - Continue monitoring HbA1c annually. - Encourage lifestyle modifications to improve glycemic control.  Hyperlipidemia LDL 142, still elevated ASCVD risk 3.4% Consideration of Statin therapy, not indicated at this time. She can pursue Coronary Calcium  CT next if interested to further risk stratify  General Health Maintenance Routine health maintenance discussed. Declined shingles and pneumonia  vaccines. Tetanus booster received April 2025. COVID booster not required. Mammogram to be scheduled in July. - Consider shingles and pneumonia vaccines in the future. - Schedule mammogram in July.      Orders Placed This Encounter  Procedures   MM 3D SCREENING MAMMOGRAM BILATERAL BREAST    Standing Status:   Future    Expiration Date:   05/02/2025    Reason for Exam (SYMPTOM  OR DIAGNOSIS REQUIRED):   Screening bilateral 3D Mammogram Tomo    Preferred imaging location?:   [facility]    Meds ordered this encounter  Medications   amLODipine  (NORVASC ) 5 MG tablet    Sig: Take 1 tablet (5 mg total) by mouth daily.    Dispense:  90 tablet    Refill:  3    90 day Add refills up to 1 year   levothyroxine  (SYNTHROID ) 175 MCG tablet    Sig: Take 1 tablet (175 mcg total) by mouth daily before breakfast.    Dispense:  90 tablet    Refill:  3    90 day Add refills up to 1 year     Follow up plan: Return for 1 year fasting lab > 1 week later Annual Physical.  Future lab 04/2025  Domingo Friend, DO [facility] 05/02/2024, 1:48 PM

## 2024-05-07 ENCOUNTER — Telehealth: Payer: Self-pay

## 2024-05-07 NOTE — Telephone Encounter (Signed)
 Copied from CRM [id number]. Topic: Clinical - Medication Question >> May 07, 2024  4:23 PM Virgia Griffins wrote: [facility] calling to change rx brand from Euthyrox  to levothyroxine  (SYNTHROID ) 175 MCG tablet.  Pharmacy can not get Euthyrox  anymore and need to confirm that its ok to switch.

## 2024-05-08 ENCOUNTER — Other Ambulatory Visit: Payer: Self-pay | Admitting: Family Medicine

## 2024-05-08 DIAGNOSIS — E034 Atrophy of thyroid (acquired): Secondary | ICD-10-CM

## 2024-05-08 NOTE — Telephone Encounter (Signed)
 Okay to switch brand.  Domingo Friend, DO [facility] 05/08/2024, 9:25 AM

## 2024-05-10 NOTE — Telephone Encounter (Signed)
 Requested medications are due for refill today.  no  Requested medications are on the active medications list.  yes  Last refill. 05/02/2024 #90 3 rf  Future visit scheduled.   yes  Notes to clinic.  Abnormal labs. Note from pharmacy: Pharmacy comment: CANT GET EUTHYROX ...SWITCHING TO LEVOTHYROXINE .Aaron AasAaron AasPATIENT OUT OF MEDICATION.     Requested Prescriptions  Pending Prescriptions Disp Refills   EUTHYROX  175 MCG tablet [Pharmacy Med Name: LEVOTHYROXIN 175MCG TAB] 90 tablet 3    Sig: TAKE 1 TABLET BY MOUTH ONCE DAILY BEFORE BREAKFAST     Endocrinology:  Hypothyroid Agents Failed - 05/10/2024 11:28 AM      Failed - TSH in normal range and within 360 days    TSH  Date Value Ref Range Status  04/25/2024 7.99 (H) mIU/L Final    Comment:              Reference Range .           > or = 20 Years  0.40-4.50 .                Pregnancy Ranges           First trimester    0.26-2.66           Second trimester   0.55-2.73           Third trimester    0.43-2.91          Failed - Valid encounter within last 12 months    Recent Outpatient Visits           1 week ago Annual physical exam   [facility] [place], [place], [place]

## 2025-05-07 ENCOUNTER — Other Ambulatory Visit

## 2025-05-15 ENCOUNTER — Encounter: Admitting: Family Medicine
